# Patient Record
Sex: Female | Born: 1965 | Marital: Married | Smoking: Never smoker
Health system: Northeastern US, Community
[De-identification: ages and names within clinical notes are randomized; demographics above are authoritative.]

## PROBLEM LIST (undated history)

## (undated) DIAGNOSIS — G4730 Sleep apnea, unspecified: Secondary | ICD-10-CM

## (undated) DIAGNOSIS — N83299 Other ovarian cyst, unspecified side: Secondary | ICD-10-CM

## (undated) DIAGNOSIS — N83209 Unspecified ovarian cyst, unspecified side: Secondary | ICD-10-CM

## (undated) DIAGNOSIS — G4733 Obstructive sleep apnea (adult) (pediatric): Secondary | ICD-10-CM

## (undated) DIAGNOSIS — Z973 Presence of spectacles and contact lenses: Secondary | ICD-10-CM

## (undated) DIAGNOSIS — K219 Gastro-esophageal reflux disease without esophagitis: Secondary | ICD-10-CM

## (undated) HISTORY — DX: Unspecified ovarian cyst, unspecified side: N83.209

## (undated) HISTORY — DX: Presence of spectacles and contact lenses: Z97.3

## (undated) HISTORY — DX: Obstructive sleep apnea (adult) (pediatric): G47.33

## (undated) HISTORY — PX: NO SIGNIFICANT SURGICAL HISTORY: 1000005

## (undated) HISTORY — DX: Other ovarian cyst, unspecified side: N83.299

## (undated) HISTORY — DX: Gastro-esophageal reflux disease without esophagitis: K21.9

## (undated) HISTORY — DX: Sleep apnea, unspecified: G47.30

---

## 1999-04-26 ENCOUNTER — Ambulatory Visit (HOSPITAL_BASED_OUTPATIENT_CLINIC_OR_DEPARTMENT_OTHER): Payer: Self-pay

## 1999-04-26 LAB — CHG CYTP SLIDES CERV/VAG MNL SCRN PHYSICIAN SUPV

## 1999-05-08 ENCOUNTER — Ambulatory Visit (HOSPITAL_BASED_OUTPATIENT_CLINIC_OR_DEPARTMENT_OTHER): Payer: Self-pay

## 1999-10-01 ENCOUNTER — Ambulatory Visit (HOSPITAL_BASED_OUTPATIENT_CLINIC_OR_DEPARTMENT_OTHER): Payer: Self-pay | Admitting: Internal Medicine

## 1999-10-12 ENCOUNTER — Ambulatory Visit (HOSPITAL_BASED_OUTPATIENT_CLINIC_OR_DEPARTMENT_OTHER): Payer: Self-pay | Admitting: Internal Medicine

## 1999-11-28 ENCOUNTER — Ambulatory Visit (HOSPITAL_BASED_OUTPATIENT_CLINIC_OR_DEPARTMENT_OTHER): Payer: Self-pay | Admitting: Internal Medicine

## 2000-01-15 ENCOUNTER — Emergency Department (HOSPITAL_BASED_OUTPATIENT_CLINIC_OR_DEPARTMENT_OTHER): Payer: Self-pay | Admitting: Emergency Medical Services

## 2000-07-18 ENCOUNTER — Ambulatory Visit (HOSPITAL_BASED_OUTPATIENT_CLINIC_OR_DEPARTMENT_OTHER): Payer: Self-pay | Admitting: Internal Medicine

## 2000-07-18 LAB — URINE CULTURE/COLONY COUNT

## 2000-11-04 ENCOUNTER — Ambulatory Visit (HOSPITAL_BASED_OUTPATIENT_CLINIC_OR_DEPARTMENT_OTHER): Payer: Self-pay | Admitting: Internal Medicine

## 2000-11-11 ENCOUNTER — Ambulatory Visit (HOSPITAL_BASED_OUTPATIENT_CLINIC_OR_DEPARTMENT_OTHER): Payer: Self-pay | Admitting: Internal Medicine

## 2000-11-11 LAB — CHG CYTP SLIDES CERV/VAG MNL SCRN PHYSICIAN SUPV

## 2001-01-08 ENCOUNTER — Ambulatory Visit (HOSPITAL_BASED_OUTPATIENT_CLINIC_OR_DEPARTMENT_OTHER): Payer: Self-pay | Admitting: Internal Medicine

## 2001-01-27 ENCOUNTER — Ambulatory Visit (HOSPITAL_BASED_OUTPATIENT_CLINIC_OR_DEPARTMENT_OTHER): Payer: Self-pay

## 2001-02-04 ENCOUNTER — Ambulatory Visit (HOSPITAL_BASED_OUTPATIENT_CLINIC_OR_DEPARTMENT_OTHER): Payer: Self-pay | Admitting: Internal Medicine

## 2001-02-05 LAB — XR CHEST 2 VIEWS

## 2001-09-29 ENCOUNTER — Ambulatory Visit (HOSPITAL_BASED_OUTPATIENT_CLINIC_OR_DEPARTMENT_OTHER): Payer: Self-pay | Admitting: Internal Medicine

## 2001-09-29 LAB — IADNA CHLAMYDIA TRACHOMATIS DIRECT PROBE TQ: GENPROBE CHLAMYDIA: NEGATIVE

## 2001-09-29 LAB — ~~LOC~~-PROGRESS/CLIN NOTE

## 2001-09-29 LAB — CYTOPATH, C/V, THIN LAYER

## 2001-09-29 LAB — IADNA NEISSERIA GONORRHOEAE DIRECT PROBE TQ: GENPROBE GC: NEGATIVE

## 2001-10-06 ENCOUNTER — Ambulatory Visit (HOSPITAL_BASED_OUTPATIENT_CLINIC_OR_DEPARTMENT_OTHER): Payer: Self-pay | Admitting: Internal Medicine

## 2001-10-06 LAB — ~~LOC~~-PROGRESS/CLIN NOTE

## 2002-04-03 ENCOUNTER — Emergency Department (HOSPITAL_BASED_OUTPATIENT_CLINIC_OR_DEPARTMENT_OTHER): Payer: Self-pay

## 2002-04-03 LAB — IADNA NEISSERIA GONORRHOEAE DIRECT PROBE TQ: GENPROBE GC: NEGATIVE

## 2002-04-03 LAB — URINE CULTURE/COLONY COUNT

## 2002-04-03 LAB — IADNA CHLAMYDIA TRACHOMATIS DIRECT PROBE TQ: GENPROBE CHLAMYDIA: NEGATIVE

## 2002-04-08 LAB — XR CHEST 2 VIEWS

## 2002-04-08 LAB — XR ABDOMEN (KUB) WITH UPRIGHT AND OR DECUBITUS

## 2003-02-04 LAB — URINALYSIS
BACTERIA: >50 PER HPF — ABNORMAL HIGH (ref 0–?)
BILIRUBIN, URINE: NEGATIVE
CASTS: NONE SEEN PER LPF
CRYSTALS: NONE SEEN
GLUCOSE, URINE: NEGATIVE MG/DL
KETONE, URINE: NEGATIVE MG/DL
LEUKOCYTE ESTERASE: NEGATIVE
NITRITE, URINE: POSITIVE — ABNORMAL HIGH
OCCULT BLOOD, URINE: NEGATIVE
PH URINE: 7.5 (ref 5.0–8.0)
RED BLOOD CELLS URINE: NONE SEEN PER HPF (ref 0–2)
SPECIFIC GRAVITY URINE: 1.02 (ref 1.003–1.035)
SQUAMOUS EPITHELIAL CELLS: >10 PER LPF — ABNORMAL HIGH (ref 0–2)

## 2003-02-04 LAB — BASIC METABOLIC PANEL
BUN (UREA NITROGEN): 10 mg/dl (ref 10–20)
CALCIUM: 8.5 mg/dl (ref 8.5–10.5)
CARBON DIOXIDE: 26 mEQ/L (ref 22–32)
CHLORIDE: 96 mEQ/L — ABNORMAL LOW (ref 98–110)
CREATININE: 0.7 mg/dl — ABNORMAL LOW (ref 0.8–1.2)
Glucose Random: 89 mg/dl (ref 65–160)
POTASSIUM: 3.6 mEQ/L (ref 3.5–5.0)
SODIUM: 138 mEQ/L (ref 135–145)

## 2003-02-04 LAB — LIPASE: LIPASE: 23 U/L (ref 8–57)

## 2003-02-04 LAB — CHG HEPATIC FUNCTION PANEL
ALANINE AMINOTRANSFERASE: 30 IU/L (ref 3–36)
ALBUMIN: 3.7 g/dl (ref 3.5–5.0)
ALKALINE PHOSPHATASE: 82 IU/L (ref 50–136)
ASPARTATE AMINOTRANSFERASE: 32 U/L — ABNORMAL HIGH (ref 7–29)
BILIRUBIN DIRECT: 0.2 mg/dl (ref 0–0.36)
BILIRUBIN TOTAL: 0.6 mg/dl (ref 0.15–1.0)
TOTAL PROTEIN: 6.5 g/dl (ref 6.2–8.5)

## 2003-02-04 LAB — BLOOD COUNT COMPLETE AUTO&AUTO DIFRNTL WBC
BASOPHIL %: 1.2 % (ref 0–2)
EOSINOPHIL %: 12.9 % (ref 0–7)
HEMATOCRIT: 39.4 % (ref 37.0–47.0)
HEMOGLOBIN: 13.6 g/dL (ref 12.0–16.0)
LYMPHOCYTE %: 31.6 % (ref 12–39)
MEAN CORP HGB CONC: 34.4 g/dL (ref 32.0–36.0)
MEAN CORPUSCULAR HGB: 32.3 pg — ABNORMAL HIGH (ref 27.0–31.0)
MEAN CORPUSCULAR VOL: 93.8 fL (ref 81.0–99.0)
MEAN PLATELET VOLUME: 8.2 fL (ref 6.4–10.8)
MONOCYTE %: 5.8 % (ref 1–12)
NEUTROPHIL %: 48.5 % (ref 46–79)
PLATELET COUNT: 233 10*3/uL (ref 150–400)
RBC DISTRIBUTION WIDTH: 12.3 % (ref 11.5–14.3)
RED BLOOD CELL COUNT: 4.2 MIL/uL (ref 4.20–5.40)
WHITE BLOOD CELL COUNT: 4.9 10*3/uL (ref 4.8–10.8)

## 2003-02-04 LAB — AMYLASE: AMYLASE: 55 U/L (ref 25–130)

## 2003-02-05 LAB — CHG LIPID PANEL
Cholesterol: 166 mg/dl (ref ?–200)
HIGH DENSITY LIPOPROTEIN: 28 mg/dl — ABNORMAL LOW (ref 35–95)
LOW DENSITY LIPOPROTEIN DIRECT: 104 mg/dl (ref ?–130)
RISK FACTOR: 5.9 — ABNORMAL HIGH (ref ?–4.4)
TRIGLYCERIDES: 276 mg/dl — ABNORMAL HIGH (ref 30–200)

## 2003-02-05 LAB — BLOOD COUNT COMPLETE AUTO&AUTO DIFRNTL WBC
BASOPHIL %: 0.9 % (ref 0–2)
EOSINOPHIL %: 8.6 % — ABNORMAL HIGH (ref 0–7)
HEMATOCRIT: 35.5 % — ABNORMAL LOW (ref 37.0–47.0)
HEMOGLOBIN: 12.4 g/dL (ref 12.0–16.0)
LYMPHOCYTE %: 31.4 % (ref 12–39)
MEAN CORP HGB CONC: 34.9 g/dL (ref 32.0–36.0)
MEAN CORPUSCULAR HGB: 32.6 pg — ABNORMAL HIGH (ref 27.0–31.0)
MEAN CORPUSCULAR VOL: 93.2 fL (ref 81.0–99.0)
MEAN PLATELET VOLUME: 8.1 fL (ref 6.4–10.8)
MONOCYTE %: 7.3 % (ref 1–12)
NEUTROPHIL %: 51.8 % (ref 46–79)
PLATELET COUNT: 245 10*3/uL (ref 150–400)
RBC DISTRIBUTION WIDTH: 12.1 % (ref 11.5–14.3)
RED BLOOD CELL COUNT: 3.8 MIL/uL — ABNORMAL LOW (ref 4.20–5.40)
WHITE BLOOD CELL COUNT: 8.1 10*3/uL (ref 4.8–10.8)

## 2003-02-05 LAB — GLUCOSE RANDOM: Glucose Random: 92 mg/dl (ref 65–160)

## 2003-02-05 LAB — THYROID SCREEN TSH REFLEX FT4: THYROID SCREEN TSH REFLEX FT4: 1.6 u[IU]/mL (ref 0.34–5.60)

## 2003-02-05 LAB — HCG QUALITATIVE SERUM: HCG QUALITATIVE SERUM: NEGATIVE

## 2003-02-05 LAB — IRON: IRON: 48 ug/dl (ref 42–135)

## 2006-07-24 ENCOUNTER — Encounter (HOSPITAL_BASED_OUTPATIENT_CLINIC_OR_DEPARTMENT_OTHER): Payer: Self-pay

## 2006-07-24 DIAGNOSIS — G4733 Obstructive sleep apnea (adult) (pediatric): Secondary | ICD-10-CM

## 2006-07-24 DIAGNOSIS — N83209 Unspecified ovarian cyst, unspecified side: Secondary | ICD-10-CM | POA: Insufficient documentation

## 2006-07-24 DIAGNOSIS — K219 Gastro-esophageal reflux disease without esophagitis: Secondary | ICD-10-CM | POA: Insufficient documentation

## 2006-07-24 HISTORY — DX: Obstructive sleep apnea (adult) (pediatric): G47.33

## 2006-07-24 HISTORY — DX: Unspecified ovarian cyst, unspecified side: N83.209

## 2006-10-27 ENCOUNTER — Ambulatory Visit (HOSPITAL_BASED_OUTPATIENT_CLINIC_OR_DEPARTMENT_OTHER): Payer: Self-pay | Admitting: Family Medicine

## 2006-10-27 ENCOUNTER — Ambulatory Visit (HOSPITAL_BASED_OUTPATIENT_CLINIC_OR_DEPARTMENT_OTHER): Payer: Commercial Managed Care - HMO | Admitting: Family Medicine

## 2006-10-27 VITALS — BP 102/68 | HR 90 | Temp 98.3°F | Resp 20 | Ht 64.0 in | Wt 203.6 lb

## 2006-10-27 DIAGNOSIS — J209 Acute bronchitis, unspecified: Secondary | ICD-10-CM

## 2006-10-27 MED ORDER — AMOXICILLIN 500 MG PO CAPS
ORAL_CAPSULE | ORAL | Status: AC
Start: 2006-10-27 — End: 2006-11-05

## 2006-10-27 MED ORDER — ALBUTEROL 90 MCG/ACT IN AERS
INHALATION_SPRAY | RESPIRATORY_TRACT | Status: AC
Start: 2006-10-27 — End: 2006-11-27

## 2006-10-27 NOTE — Patient Instructions (Signed)
Continuar con   Tylenol Cold & Sinus si ayuda con sus symptomas    Tambien puede [place] Afrin Nasal spray por congestion    Y     Ibuprofen (Advil/Motrin o generico) 600mg  cada 6-8 horas.     Regressa a clinica si no siente mejor in 4-5 dias.

## 2006-10-27 NOTE — Progress Notes (Signed)
SUBJECTIVE:   Bonnie Tucker is a 40 year old female who complains of sinus and nasal congestion, sore throat, nasal blockage, post nasal drip, myalgias, headache, fever and chills for 14 days. She denies a history of chills and fevers and denies a history of asthma. Patient denies smoke cigarettes. Tylenol cold & Sinus, Robitussin DM & PM, neither helped. Cough produtive of green phlegm.     Family H/o asthma in sister.  Soc: non-smoker     OBJECTIVE:  She appears well, vital signs are as noted by the nurse. Ears normal. Throat and pharynx normal. Neck supple. No adenopathy in the neck. Nose is congested. Sinuses non tender. The chest is clear, without wheezes or rales.CVS exam: S1, S2 normal, no murmur, click, rub or gallop, regular rate and rhythm.      ASSESSMENT:   Acute bronchitis    PLAN:    466.0 ACUTE BRONCHITIS (primary encounter diagnosis)  Plan: AMOXICILLIN 500 MG OR CAPS, ALBUTEROL 90    MCG/ACT IN AERS  Symptomatic therapy suggested: push fluids, rest and ROV prn if symptoms persist or worsen. Call or return to clinic prn if these symptoms worsen or fail to improve as anticipated. Continue OTC meds for symptom relief as outlined in Pt. Instructions.

## 2006-11-26 ENCOUNTER — Ambulatory Visit (HOSPITAL_BASED_OUTPATIENT_CLINIC_OR_DEPARTMENT_OTHER): Payer: Self-pay

## 2007-01-05 ENCOUNTER — Encounter (HOSPITAL_BASED_OUTPATIENT_CLINIC_OR_DEPARTMENT_OTHER): Payer: Self-pay

## 2007-01-05 ENCOUNTER — Ambulatory Visit (HOSPITAL_BASED_OUTPATIENT_CLINIC_OR_DEPARTMENT_OTHER): Payer: Commercial Managed Care - PPO

## 2007-01-05 VITALS — BP 122/82 | HR 86 | Temp 98.8°F | Ht 62.0 in | Wt 203.0 lb

## 2007-01-05 DIAGNOSIS — Z0000 Encounter for general adult medical examination without abnormal findings: Secondary | ICD-10-CM

## 2007-01-05 DIAGNOSIS — N920 Excessive and frequent menstruation with regular cycle: Secondary | ICD-10-CM

## 2007-01-05 DIAGNOSIS — E669 Obesity, unspecified: Secondary | ICD-10-CM

## 2007-01-05 DIAGNOSIS — Q828 Other specified congenital malformations of skin: Secondary | ICD-10-CM

## 2007-01-05 DIAGNOSIS — Z23 Encounter for immunization: Secondary | ICD-10-CM

## 2007-01-05 LAB — BLOOD COUNT COMPLETE AUTO&AUTO DIFRNTL WBC
BASOPHIL %: 0.7 % (ref 0.0–2.0)
EOSINOPHIL %: 8.3 % — ABNORMAL HIGH (ref 0.0–7.0)
HEMATOCRIT: 36.3 % (ref 36.0–48.0)
HEMOGLOBIN: 12.3 g/dl (ref 12.0–16.0)
LYMPHOCYTE %: 27.4 % (ref 13.0–39.0)
MEAN CORP HGB CONC: 33.8 g/dl (ref 32.0–36.0)
MEAN CORPUSCULAR HGB: 31.2 pg (ref 27.0–33.0)
MEAN CORPUSCULAR VOL: 92.3 fl (ref 80.0–100.0)
MEAN PLATELET VOLUME: 8.3 fl (ref 6.4–10.8)
MONOCYTE %: 6.3 % (ref 1.0–12.0)
NEUTROPHIL %: 57.3 % (ref 46.0–79.0)
PLATELET COUNT: 236 10*3/uL (ref 150–400)
RBC DISTRIBUTION WIDTH: 13.4 % (ref 11.5–14.3)
RED BLOOD CELL COUNT: 3.94 M/uL — ABNORMAL LOW (ref 4.50–5.10)
WHITE BLOOD CELL COUNT: 6.9 10*3/uL (ref 4.0–10.8)

## 2007-01-05 LAB — THYROID SCREEN TSH REFLEX FT4: THYROID SCREEN TSH REFLEX FT4: 2.58 u[IU]/mL (ref 0.34–5.60)

## 2007-01-05 LAB — GLUCOSE FASTING: GLUCOSE FASTING: 85 mg/dl (ref 74–118)

## 2007-01-05 LAB — CHG LIPID PANEL
Cholesterol: 186 mg/dl (ref 0–200)
HIGH DENSITY LIPOPROTEIN: 30 mg/dl — ABNORMAL LOW (ref 35–85)
LOW DENSITY LIPOPROTEIN DIRECT: 126 mg/dl — ABNORMAL HIGH (ref 0–100)
RISK FACTOR: 6.2 — ABNORMAL HIGH (ref ?–4.4)
TRIGLYCERIDES: 204 mg/dl — ABNORMAL HIGH (ref 0–150)

## 2007-01-05 NOTE — Patient Instructions (Signed)
Mk appt for pap and skin tag removal w/ [address]

## 2007-01-05 NOTE — Progress Notes (Signed)
Influenza Vaccine Procedure  January 05, 2007    1. Has the patient received the information for the influenza vaccine? Yes    2. Does the patient have any of the following contraindications?  Allergy to eggs? No  Allergic reaction to previous influenza vaccines? No  Any other problems to previous influenza vaccines? No  Paralyzed by Guillain-Barre syndrome? No  Currently pregnant? No  Current moderate or severe illness? No  Allergy to contact lens solution? No    3. The vaccine has been administered in the usual fashion and the patient/guardian was instructed to wait 20 minutes before leaving the building in the event of an allergic reaction:     Immunization information and VIS for flu vaccine(s) for 2007-2008 reviewed; verbal consent given by patient/guardian.    @TD @  VIS given prior to administration and reviewed with the patient and or legal guardian. Patient understands the disease and the vaccine. See immunization/Injection module or chart review for date of publication and additional information.  @ME @

## 2007-01-05 NOTE — Progress Notes (Signed)
41 year old female for annual routine checkup.    PMH/PSH:  Patient Active Problem List:   MIGRAINE NOS [346.9]   OVARIAN CYST NEC/NOS [620.2]   OBSTRUCTIVE SLEEP APNEA, ADULT & PED [327.23]    OB/gyn hx:  [id number]  LMP 01/03/07  No h/o abnl pap.  No h/o STIs.      No current outpatient prescriptions on file prior to 01/05/07.      Review of Patient's Allergies indicates:   Nkda (no known drug*       Review of patient's family history indicates:   Hypertension Mother    Asthma Sister    OTHER Father    Comment: killed by terrorists [place]   In Good Health Brother    In Good Health Brother       Social History   Marital Status: Married Number of children: 3     Social History Main Topics   Tobacco Use: Never    Alcohol Use: No    Drug Use: No    Sexual Activity: Yes Partners with: Female   Birth Control/Protection: IUD   Comment: paraguard 2000    Social History Narrative   Live w/ husband and three children. [place] in [employer]     ROS:   Gen: No appetite change. 30 lbs in 3 yrs. No night sweats.   HEENT: No HAs. No changes in hearing or vision changes. No oral lesions.   Pulm/CV: No sob, cough, hemoptysis. No CP. No palpitations. No dyspnea or chest pain on exertion.   GI: No abdominal pain, change in bowel habits, black or bloody stools.   Uro:No urinary tract symptoms.   Derm: Pt w/ neck skin tags.   Psych: Mood stable.  GYN: menses q 28 d, bleed for 5d, flow=heavy, (+) dysmenorrhea relieved w/ motrin. (-) vaginal d/c.  GU: No sexual concerns  MS: No joint pain or m pain    Diet/Nutrition: servings fruit/veg:2 soda:0 fiber:11  Physical activity: no. Will start this wk.      RHM:  Last pap 1 yr ago  Ca+2/Vit D dw pt today  Folic acid dw today   Td due  Flu due  Chicken pox as child  Last dentist 1 mos ago  Cholesterol ?  BS ?    OBJECTIVE: BP 122/82   Pulse 86   Temp (Src) 98.8 (Oral)   Ht 5\' 2"  (1.44m)   Wt 203 lbs (92.1kg)   SaO2 96%   LMP 01/03/2007  Gen: WD/WN, in NAD.   Skin: Warm/dry, small ~  < or = 1mm skin tags anterior and R lateral neck diffusely, ~5.  HEENT: NC/AT; PERRL, EOMI; TMs wnl;OP MMM, no erythema or lesions.  Neck: supple. No adenopathy or thyromegaly.   Cor: RRR, no r/g/m  Lungs: CTA b/l, no wheezes, rhonchi or rales.   Abdomen:+BS, soft, NT/ND, no HSM  Breasts: Normal without suspicious masses, skin or nipple changes or discharge, symmetric fibrous changes throughout b/l, self-exam is taught.   GU: deferred pt w/ menses  Ext: No clubbing, cyanosis or edema. DP 2 + b/l.  Neuro: CN II-XII intact b/l, A+Ox3, MS 5/5, DTRs 2+ upper and lower ext b/l, gross coordination and sensation intact throughout.  Psych: Affect, thought process, speech and memory wnl  MS: No jt pain, scoliosis, kyphosis or CVAT.    ASSESSMENT: 41 year old female here for CPEX w/ obesity.     PLAN:   626.2 EXCESSIVE MENSTRUATION  Plan: THYROID SCREEN TSH, COMPLETE CBC W/AUTO DIFF    WBC, ROUTINE VENIPUNCTURE       V70.0 ROUTINE MEDICAL EXAM  Plan: LIPID PANEL, GLUCOSE FASTING, ROUTINE    VENIPUNCTURE. BE today wnl. Order for screening mammo. Dw pt Ca and vit d and folic acid supplementation. Pt to rtc for pap as w/ menses today. Flu and tetanus given today.        V06.5 VACCINE FOR TETANUS/DIPHTERIA  Plan: IMMUNIZATION ADMIN EACH ADD, RN, TD VACCINE >    7, IM       V04.81 VACCINE FOR INFLUENZA  Plan: IMMUNIZATION ADMIN SINGLE, RN, FLU VACCINE, 3    YRS & >, IM       278.00 OBESITY NOS  Plan: REFERRAL TO NUTRITION (INT). Dw pt healthy eating and inc'ing fruits and veggies. Inc phys act w/ monitoring w/ pedometer.     757.39 SKIN ANOMALY NEC  Note: skin tags   Plan: F/u next visit for removal.    RTC next avail for pap and skin tag removal and in 3 mos for obesity and in 1 yr for cpex and pap or prn acute issues    D/w Dr. Eliberto Ivory Thurl Boen,MD

## 2007-01-12 ENCOUNTER — Other Ambulatory Visit: Payer: Self-pay

## 2007-01-13 LAB — MA SCREENING MAMMO BILATERAL WITH CAD

## 2007-01-20 ENCOUNTER — Ambulatory Visit (HOSPITAL_BASED_OUTPATIENT_CLINIC_OR_DEPARTMENT_OTHER): Payer: Commercial Managed Care - PPO

## 2007-01-20 VITALS — BP 110/76 | HR 101 | Temp 98.1°F | Resp 18 | Wt 208.0 lb

## 2007-01-20 DIAGNOSIS — Z124 Encounter for screening for malignant neoplasm of cervix: Secondary | ICD-10-CM

## 2007-01-20 DIAGNOSIS — E785 Hyperlipidemia, unspecified: Secondary | ICD-10-CM

## 2007-01-20 NOTE — Progress Notes (Signed)
The patient was discussed with Dr. Mann. Agree with assessment and plan.

## 2007-01-20 NOTE — Progress Notes (Signed)
SUBJECTIVE: 41 year old female w/ Patient Active Problem List:   MIGRAINE NOS [346.9]   OVARIAN CYST NEC/NOS [620.2]   OBSTRUCTIVE SLEEP APNEA, ADULT & PED [327.23]   who presents for pap and pelvic. Seen for cpex 01/05/07 and pap deferred 2/2 menses.  OB/gyn hx:  [id number]  LMP 01/03/07  No h/o abnl pap.  No h/o STIs.  GYN: menses q 28 d, bleed for 5d, flow=heavy, (+) dysmenorrhea relieved w/ motrin. (-) vaginal d/c.  GU: No sexual concerns  Sexual Activity: Yes Partners with: Female   Birth Control/Protection: IUD   Comment: paraguard 2000  Review cholesterol labs w/ pt.     OBJECTIVE: BP 110/76   Pulse 101   Temp (Src) 98.1 (Oral)   Resp 18   Wt 208 lbs (94.3kg)  Gen: NAD  GU: Exam chaperoned by [place], normal cervix without lesions, polyps or tenderness, uterus normal size, shape, consistency, no mass or CMT, adnexa normal in size without mass or tenderness. Pap and hpv dna done.      ASSESSMENT: 41 year old female w/ Patient Active Problem List:   MIGRAINE NOS [346.9]   OVARIAN CYST NEC/NOS [620.2]   OBSTRUCTIVE SLEEP APNEA, ADULT & PED [327.23]here for pap and pelvic exam.     PLAN:  V76.2 SCREENING MAL NEOP-CERVIX (primary encounter diagnosis)  Plan: PAP SMEAR THIN PREP, HUMAN PAPILLOMAVIRUS    272.4 HYPERLIPIDEMIA NEC/NOS  Note: tg 204  Plan: dw pt dec sugar and carbs. Advised to eat whole grains when eating carbs. Omega 3 fatty acid. Inc physical activity.          Rtf 01/28/07 w/ dr lesser for skin tag removal.    D/w Dr. Valera Castle, MD

## 2007-01-26 ENCOUNTER — Encounter (HOSPITAL_BASED_OUTPATIENT_CLINIC_OR_DEPARTMENT_OTHER): Payer: Commercial Managed Care - PPO | Admitting: Registered"

## 2007-01-28 ENCOUNTER — Ambulatory Visit (HOSPITAL_BASED_OUTPATIENT_CLINIC_OR_DEPARTMENT_OTHER): Payer: Commercial Managed Care - PPO

## 2007-01-28 VITALS — BP 100/70 | HR 90 | Temp 98.6°F | Wt 206.0 lb

## 2007-01-28 DIAGNOSIS — L909 Atrophic disorder of skin, unspecified: Secondary | ICD-10-CM

## 2007-01-28 DIAGNOSIS — L919 Hypertrophic disorder of the skin, unspecified: Principal | ICD-10-CM

## 2007-01-28 LAB — CYTOPATH, C/V, THIN LAYER

## 2007-01-28 NOTE — Progress Notes (Addendum)
Quick Note:    Will await hpv dna results.  ______

## 2007-01-28 NOTE — Progress Notes (Signed)
PATIENT/PROCEDURE VERIFICATION DOCUMENTATION    Correct patient: Yes  Correct procedure: Yes  Correct side, site, mark visible if applicable: N/A  Correct position: Yes  Special equipment/implant(s) present, if applicable: Yes    Time-out completed, documented by provider doing procedure or designated team member:  Myna Bright, MD 01/28/2007 11:27 AM    SUBJECTIVE:   Bonnie Tucker is a 41 year old female who presents for skin tag removal. We have discussed this procedure, including option of not performing surgery, technique of surgery and potential for scarring at a recent visit.    OBJECTIVE:   Patient appears well. Vitals are normal.  Skin: 10 skin tags on neck, upper chest, and one on upper thigh    ASSESSMENT:   skin tags, without lesions    PLAN:   After informed consent was obtained, using Betadine or alcohol for cleansing and 2% xylocaine with epinephrine for anesthetic (only 2 lesions anesthetized), with sterile technique, skin tag excision with tissue forceps was performed. Dressing is applied, and wound care instructions provided. Be alert for any signs of cutaneous infection. The procedure was well tolerated without complications. Follow up: the patient may return prn.

## 2007-01-29 LAB — HUMAN PAPILLOMAVIRUS (HPV): HUMAN PAPILLOMAVIRUS: NEGATIVE

## 2007-01-29 NOTE — Progress Notes (Addendum)
I d/w resident physician, first assited at procedure, and agree c assessment and plan as documented in the resident physician's note.

## 2007-02-02 NOTE — Progress Notes (Addendum)
This patient's case was reviewed with the resident by me in its entirety. I have reviewed the key points of the history and physical as written by the resident. I agree with the assessment and plan as stated in the resident note. The patient was not seen by me personally.

## 2007-02-23 ENCOUNTER — Ambulatory Visit (HOSPITAL_BASED_OUTPATIENT_CLINIC_OR_DEPARTMENT_OTHER): Payer: Commercial Managed Care - PPO | Admitting: Registered"

## 2007-02-23 DIAGNOSIS — E669 Obesity, unspecified: Secondary | ICD-10-CM

## 2007-02-26 ENCOUNTER — Encounter (HOSPITAL_BASED_OUTPATIENT_CLINIC_OR_DEPARTMENT_OTHER): Payer: Commercial Managed Care - HMO | Admitting: Registered"

## 2007-02-27 DIAGNOSIS — E669 Obesity, unspecified: Secondary | ICD-10-CM | POA: Insufficient documentation

## 2007-02-27 NOTE — Progress Notes (Signed)
NUTRITION INITIAL VISIT    Brief 15 min visit as pt was 45 mins late to appt. Reviewed intake, encouraged eating more often throughout the day (often eating only once or twice). Also encouraged more activity as able. Reviewed plate picture as well. Agreeable to suggestions. Follow up in 3 months.     Lajean Manes, MS, RD, LDN

## 2007-07-10 ENCOUNTER — Ambulatory Visit (HOSPITAL_BASED_OUTPATIENT_CLINIC_OR_DEPARTMENT_OTHER): Payer: Commercial Managed Care - HMO

## 2007-07-10 VITALS — BP 106/70 | HR 81 | Temp 98.7°F | Resp 20 | Wt 208.2 lb

## 2007-07-10 DIAGNOSIS — Z7184 Encounter for health counseling related to travel: Secondary | ICD-10-CM

## 2007-07-10 MED ORDER — CIPROFLOXACIN HCL 500 MG PO TABS
ORAL_TABLET | ORAL | Status: DC
Start: 2007-07-10 — End: 2008-01-15

## 2007-07-10 NOTE — Progress Notes (Signed)
PRECEPTOR NOTE  I personally discussed and reviewed the history, physical, and plan with the resident.  Please see resident's note for further details.

## 2007-07-11 NOTE — Progress Notes (Signed)
This is a 40 year old female with no significant medical issues who presents for counseling on immunizations prior to leaving for a two week trip to [place] in three days. Plans on staying in [place] with her husband and daughter, with a few day stay outside the city[place]    Otherwise, doing well. No recent illness.     No current outpatient prescriptions on file prior to encounter.    Review of Patient's Allergies indicates:  No Known Allergies.    OBJECTIVE:  BP 106/70   Pulse 81   Temp (Src) 98.7 F (37.1 C) (Oral)   Resp 20   Wt 208 lb 3.2 oz (94.439 kg)   SpO2 97%   LMP 07/07/2007  Gen: NAD, alert and oriented, pleasant.  Affect: normal.    ASSESSMENT:  41 year old female in apparent good health here for travel counseling.    PLAN:  Looked on [employer]. It appears that she will not be in a high risk zone for typhoid or malaria. No point in giving Hep A vaccine as she is leaving in three days, coming back in two weeks. Counseled on water and hygiene safety, malaria prophylaxia (long clothes, bug deterants). Gave script for three days of Cipro if has severe diarrhea with fever. If not improving in three days needs to be seen by physician there. RTO when returns for CPEX.    This case was discussed with Dr. Gaspar Skeeters.

## 2007-08-25 ENCOUNTER — Ambulatory Visit (HOSPITAL_BASED_OUTPATIENT_CLINIC_OR_DEPARTMENT_OTHER): Payer: Commercial Managed Care - HMO

## 2007-09-24 ENCOUNTER — Ambulatory Visit (HOSPITAL_BASED_OUTPATIENT_CLINIC_OR_DEPARTMENT_OTHER): Payer: Commercial Managed Care - HMO

## 2007-10-08 ENCOUNTER — Ambulatory Visit (HOSPITAL_BASED_OUTPATIENT_CLINIC_OR_DEPARTMENT_OTHER): Payer: Commercial Managed Care - HMO

## 2008-01-15 ENCOUNTER — Ambulatory Visit (HOSPITAL_BASED_OUTPATIENT_CLINIC_OR_DEPARTMENT_OTHER): Payer: Commercial Managed Care - HMO

## 2008-01-15 ENCOUNTER — Encounter (HOSPITAL_BASED_OUTPATIENT_CLINIC_OR_DEPARTMENT_OTHER): Payer: Self-pay

## 2008-01-15 VITALS — BP 120/70 | HR 84 | Temp 98.3°F | Resp 20 | Ht 62.0 in | Wt 209.1 lb

## 2008-01-15 DIAGNOSIS — S43429A Sprain of unspecified rotator cuff capsule, initial encounter: Secondary | ICD-10-CM

## 2008-01-15 DIAGNOSIS — Z0000 Encounter for general adult medical examination without abnormal findings: Secondary | ICD-10-CM

## 2008-01-15 DIAGNOSIS — E876 Hypokalemia: Secondary | ICD-10-CM

## 2008-01-15 LAB — BASIC METABOLIC PANEL
ANION GAP: 4 mmol/L (ref 2–25)
BUN (UREA NITROGEN): 11 mg/dl (ref 6–20)
CALCIUM: 8.9 mg/dl (ref 8.6–10.0)
CARBON DIOXIDE: 27 mmol/L (ref 22–32)
CHLORIDE: 107 mmol/L (ref 101–111)
CREATININE: 0.9 mg/dl (ref 0.4–1.2)
ESTIMATED GLOMERULAR FILT RATE: >60 mL/min (ref 60–116)
Glucose Random: 108 mg/dl (ref 74–160)
POTASSIUM: 3.7 mmol/L (ref 3.5–5.1)
SODIUM: 138 mmol/L (ref 135–144)

## 2008-01-15 LAB — SCREENING TEST VISUAL ACUITY QUANTITATIVE BILAT

## 2008-01-15 MED ORDER — MOTRIN 600 MG PO TABS
ORAL_TABLET | ORAL | Status: AC
Start: 2008-01-15 — End: 2008-03-14

## 2008-01-15 NOTE — Progress Notes (Signed)
VIS given prior to administration and reviewed with the patient and or legal guardian. Patient understands the disease and the vaccine. See immunization/Injection module or chart review for date of publication and additional information.

## 2008-01-15 NOTE — Progress Notes (Signed)
PRECEPTOR NOTE   I personally reviewed patient's history and exam findings with resident Dr. Butts.  I confirm the key elements of history and physical exam as described in resident's note.  I agree with the assessment and plan as described by resident.  Please see resident's note for further details.

## 2008-01-15 NOTE — Progress Notes (Signed)
SUBJECTIVE:   42 year old female for annual routine pap and checkup.  Last CPEX few years ago.  [place] Vernie Mur[place] [address]  Never had abnormal pap.  Lipids checked in 11/04/05      Unsure of last tetanus.  Would like to get today.    Uses IUD for birth control. Has had for ~10 years.  Would like new IUD.  Has had no issues with IUD, no concerns or complaints.  Still mensturating regularly. LMP 3 weeks ago.     Pt reports hx of hypokalemia on lab in past, never used diuretics or herbal wt loss supplements. Would like k+checked today.    Pt has one concern today. Reports R arm pain which starts in shoulder and radiates down entire arm. Sx 's x 1 week, started when pt slipped on ice, flailed arms while trying to regain balance, did not fall.  Has had pain since.  Pain has gotten progessively better.  Has triend icy hot with some relief.  Originally had neck stiffness as well, which has resolved.  Has been taking 400 mg motrin bid-tid for most days since injury, which has also helped.  Associated with weakness. No numbness or tingling.  Pain is described as 6/10, which is improved, hurts worse with movement, lifting.  Feels better with rest.  Does not wake pt from sleep.      Current outpatient Rx: none    Allergies: Review of patient's allergies indicates no known allergies.   Patient's last menstrual period was 12/25/2007.    ROS:  Feeling well. No dyspnea or chest pain on exertion.  No abdominal pain, change in bowel habits, black or bloody stools.  No urinary tract symptoms. GYN ROS: normal menses, no abnormal bleeding, pelvic pain or discharge and no breast pain or new or enlarging lumps on self exam.    OBJECTIVE:   The patient appears well, in NAD.   BP 120/70   Pulse 84   Temp (Src) 98.3 F (36.8 C) (Oral)   Resp 20   Ht 5\' 2"  (1.575 m)   Wt 209 lb 2 oz (94.858 kg)   SpO2 98%   LMP 12/25/2007  ENT normal.  Neck supple. No adenopathy or thyromegaly. PERLA. Clear, good air entry, no wheezes,  rhonci or rales. S1 and S2 normal, no murmurs, regular rate and rhythm. No edema. Abdomen soft without tenderness, guarding, mass or organomegaly. Ext: no c/c/e, pulses 2+ b/l.  RUE with palpable trapezius spasm, no ttp on R shoulder. +discomfort with ROM. +empty can test. No impingement.  Neuro: Strength 5/5 bl UE and LE, no focal sensory or motor deficits, DTR's 3+ bl UE and LE.    BREAST EXAM: normal without suspicious masses, skin or nipple changes or axillary nodes, examined in upright and supine position, nipples normal without inversion, lesions or discharge, no skin dimpling or peau d'orange    PELVIC EXAM: exam chaperoned by nurse, normal vagina and vulva, EGBUS within normal limits, vaginal discharge described as white, normal cervix without lesions, polyps or tenderness, uterus normal size, shape, consistency, no mass or tenderness, adnexa normal in size without mass or tenderness    ASSESSMENT:   well woman with rotator cuff strain    PLAN:     V70.0B Routine Medical Exam  (primary encounter diagnosis)  Plan: VISUAL ACUITY SCREEN, TDAP VACCINE >7 IM,         CYTOPATH, C/V, THIN LAYER, HUMAN  PAPILLOMAVIRUS, AMPLIFIED GENPROBE CHLAM/GC          counseled on breast self exam, mammography screening - pt had normal mammogram 1 yr ago, discussed data re screeing women 40-49. Pt declined mammogram this year  family planning choices - pt would like current IUD removed and new IUD placed. Pt was counseled regarding the procedure, risks, benefits and different types. Would like Paraguard.    Also counseled regarding adequate intake of calcium and vitamin D      276.8A Hypokalemia  Comment: past hx  Plan: BASIC METABOLIC PANEL        840.4 Rotator Cuff (Capsule) Sprain and Strain  Comment: mild  Plan: MOTRIN 600 MG OR TABS        Return in 4 weeks if no improvement, or sooner prn    Appt for gyn clinic for IUD removal/placement    D/w Roger Shelter

## 2008-01-19 LAB — CHLAMYDIA GC NAAT
GENPROBE CHLAMYDIA: NEGATIVE
GENPROBE GC: NEGATIVE

## 2008-01-20 LAB — HUMAN PAPILLOMAVIRUS (HPV): HUMAN PAPILLOMAVIRUS: NEGATIVE

## 2008-01-26 LAB — CYTOPATH, C/V, THIN LAYER

## 2008-02-01 ENCOUNTER — Telehealth (HOSPITAL_BASED_OUTPATIENT_CLINIC_OR_DEPARTMENT_OTHER): Payer: Self-pay | Admitting: Family Medicine

## 2008-02-01 ENCOUNTER — Ambulatory Visit (HOSPITAL_BASED_OUTPATIENT_CLINIC_OR_DEPARTMENT_OTHER): Payer: Commercial Managed Care - HMO

## 2008-02-01 LAB — URINE PREGNANCY TEST (POINT OF CARE): HCG QUALITATIVE URINE: NEGATIVE

## 2008-02-01 MED ORDER — MOTRIN 600 MG PO TABS
ORAL_TABLET | ORAL | Status: AC
Start: 2008-02-01 — End: 2008-02-01

## 2008-02-01 NOTE — Progress Notes (Signed)
Motrin 600mg  PO given as ordered.  Lot#[id number], exp.12/30/08.  Mfr: [facility]. Pt denies allergies.  Medication teaching done.

## 2008-02-01 NOTE — Telephone Encounter (Signed)
lmtcb

## 2008-02-01 NOTE — Progress Notes (Signed)
PATIENT/PROCEDURE VERIFICATION DOCUMENTATION    Correct patient: Yes  Correct procedure: Yes  Correct side, site, mark visible if applicable: Yes  Correct position: Yes  Special equipment/implant(s) present, if applicable: Yes    Time-out completed, documented by provider doing procedure or designated team member:  Epifanio Lesches    02/01/2008    71:20 PM64 year old, female,  Obstetric History    The patient has not been asked about pregnancy.    No LMP recorded.     She is here for an IUD insertion.    Currently she is   sexually active, monogomous  using contraception: IUD  not concerned about risk for STD      She denies current symptoms of possible pelvic infection, recent pelvic infection, pregnancy, rheumatic or congenital heart disease and allergy to IUD materials.    Completed Labs: negative pregnancy tests    Patient has verbalized understanding of risks and benefits and has signed the consent form.  Current statistical risk of pregnancy with IUD is 0.3-1%.  Current statistical risk of of ectopic pregnancy with IUD is low but if pregnancy occurs ectopic risk is about 35%.    EXAM:  Uterus: anteverted, normal size and non-tender    PROCEDURE:  Paraguard IUD removed easily with traction, completely intact.     Cervix prepped with betadine.  Tenaculum applied to anterior lip of the cervix.    IUD inserted easily.    Strings trimmed.  IUD type: Mirena    Instructions given to patient regarding checking IUD strings, anticipated vaginal bleeding and warning signs.  Instructed to continue to track menstrual cycles and call for any missed periods or abnormal bleeding or pain.    Follow up appointment in 6 weeks.

## 2008-02-01 NOTE — Telephone Encounter (Signed)
Staff Message copied by Earlyne Iba on Mon Feb 01, 2008 5:04 PM  ------   Message from: [place], [place]   Created: Mon Feb 01, 2008 4:29 PM   Regarding: letter    Bonnie Tucker [id number], 42 year old, female, Telephone Information:  Home Phone [phone]  Work Phone [phone]      [name] NUMBER: [phone]  Cell phone:   Other phone:    Available times:    Patient's language of care: English    Patient does not need an interpreter.    Patient's Care Team:     Person calling on behalf of patient: Daughter, carmen    Calls today requesting a letter for: Out of work/school    [facility]  Phone: [phone] Fax: [phone]

## 2008-02-01 NOTE — Telephone Encounter (Signed)
Staff Message copied by Earlyne Iba on Mon Feb 01, 2008 5:51 PM  ------   Message from: Bufford Lope   Created: Mon Feb 01, 2008 5:49 PM   Regarding: call patient    Bonnie Tucker [id number], 42 year old, female, Telephone Information:  Home Phone [phone]  Work Phone [phone]      [name] NUMBER: [phone]  Cell phone:   Other phone:    Available times:    Patient's language of care: English    Patient does not need an interpreter.    Patient's PCP: Enis Gash. Sampson Goon, MD    Person calling on behalf of patient: Patient (self)    Calls today   to speak to provider only.  with questions and concerns.  Wants to speak to a nurse    Patient's [facility]erred [facility]:   [facility]  Phone: [phone] Fax: [phone]

## 2008-03-28 ENCOUNTER — Ambulatory Visit (HOSPITAL_BASED_OUTPATIENT_CLINIC_OR_DEPARTMENT_OTHER): Payer: Commercial Managed Care - HMO

## 2008-03-28 VITALS — BP 116/84 | HR 83 | Temp 98.5°F | Resp 20 | Wt 211.0 lb

## 2008-03-28 DIAGNOSIS — Z30431 Encounter for routine checking of intrauterine contraceptive device: Secondary | ICD-10-CM

## 2008-03-28 NOTE — Progress Notes (Signed)
Bonnie Tucker is a 42 year old female who presents today for 6 week f/u s/p IUD placement.  +some irregular spotting. LMP 03/19/08 and persists today 9 days later, very light spotting of red blood. No cramping.  Otherwise without complaints or concerns today    PEx:  BP 116/84   Pulse 83   Temp (Src) 98.5 F (36.9 C) (Oral)   Resp 20   Wt 211 lb (95.709 kg)   SpO2 99%  General appearance: in no apparent distress, well developed and well nourished, alert, oriented times 3, normal vitals and cooperative.   Abd: soft, NT,  ND, +bs  Pelvic: normal exam, IUD strings visible extending appox 1 cm out of os. +some dark bloody discharge visible.    RTC in 12/2008 for CPEX or sooner prn    D/w Azucena Fallen

## 2008-03-28 NOTE — Progress Notes (Signed)
PRECEPTOR NOTE:  On the day of the patient's visit, I discussed the key elements of history and physical exam and I reviewed the findings with the resident.  I agree with the assessment and plan as described in their documentation.  Please see resident's note for further details.

## 2008-05-31 ENCOUNTER — Ambulatory Visit (HOSPITAL_BASED_OUTPATIENT_CLINIC_OR_DEPARTMENT_OTHER): Payer: Self-pay

## 2008-10-04 ENCOUNTER — Ambulatory Visit (HOSPITAL_BASED_OUTPATIENT_CLINIC_OR_DEPARTMENT_OTHER): Payer: Commercial Managed Care - HMO

## 2008-10-04 VITALS — BP 110/90 | HR 90 | Temp 98.3°F | Resp 22 | Wt 205.2 lb

## 2008-10-04 DIAGNOSIS — R071 Chest pain on breathing: Secondary | ICD-10-CM

## 2008-10-04 MED ORDER — MOTRIN 800 MG PO TABS
ORAL_TABLET | ORAL | Status: AC
Start: 2008-10-04 — End: 2008-10-18

## 2008-10-04 NOTE — Progress Notes (Signed)
Bonnie Tucker is a 42 year old female here with back pain    SUBJECTIVE:  Developed pain in center of chest and back  4 days. Pain was initially 9/10, sharp, and worse with breathing and movement. Tried motrin 800mg  which her husband had which helped.  Pt has not had any recent cough or URI symptoms. She has not had dyspnea but says it hurts to take a deep breath. Pt has had headaches and general body fatigue. Says she stayed in bed almost all day Saturday because she felt so bad. + Subjective fevers but no chills. No palpitations, pain radiating down are, or jaw pain. No one sick at home. No recent travel. No injuries. Says just today she began with a sore throat.     OBJECTIVE:  BP 110/90   Pulse 90   Temp (Src) 98.3 F (36.8 C) (Oral)   Resp 22   Wt 205 lb 4 oz (93.101 kg)   SpO2 98%  Gen: NAD, sitting comfortable  SINUSES: nontender to palpation  MOUTH: no lesions/ulcers, no tonsilar enlargement or pharyngeal exudate  CVR:RRR, no murmurs or rubs  RESP: CTAB, no wrr, no rubs, chest pain reproducible with pressing on chest. No distinct area of tenderness just in a general location in center.     EKG: NSR at 72 bpm. TWF in V1. No ST elevations or depression. Normal intervals. Normal axis.     A/P: [name] is a 42 year old female with reproducible pleuritic type chest pain. No cough or URI sxs. DDX chostonchondritis, pleuritis, pericarditis(less likely as EKG and cardiac exam wnl). May also be the beginning of viral infection or flu.     -rx motrin 800mg  x 2 wks  -f/up in 1wk  -encouraged pt to call if pain persists or develops chest pain with exertion, dyspnea, SOB, fevers, or chills    Maebelle Munroe, MD    D/w Dr. Judie Petit

## 2008-10-04 NOTE — Progress Notes (Signed)
This case was d/w Dr. Martie Lee.  Agree with findings and plan as documented in resident's note.

## 2008-10-05 LAB — EKG

## 2008-10-26 ENCOUNTER — Encounter (HOSPITAL_BASED_OUTPATIENT_CLINIC_OR_DEPARTMENT_OTHER): Payer: Self-pay

## 2008-11-14 ENCOUNTER — Ambulatory Visit: Payer: Self-pay

## 2008-11-15 LAB — MA SCREENING MAMMO BILATERAL WITH CAD

## 2008-11-22 ENCOUNTER — Ambulatory Visit (HOSPITAL_BASED_OUTPATIENT_CLINIC_OR_DEPARTMENT_OTHER): Payer: Commercial Managed Care - HMO

## 2008-11-22 ENCOUNTER — Telehealth (HOSPITAL_BASED_OUTPATIENT_CLINIC_OR_DEPARTMENT_OTHER): Payer: Self-pay | Admitting: Family Medicine

## 2008-11-22 VITALS — BP 126/80 | HR 83 | Temp 99.4°F | Resp 18 | Wt 200.0 lb

## 2008-11-22 DIAGNOSIS — B349 Viral infection, unspecified: Secondary | ICD-10-CM

## 2008-11-22 MED ORDER — TESSALON PERLES 100 MG PO CAPS
ORAL_CAPSULE | ORAL | Status: AC
Start: 2008-11-22 — End: 2008-12-06

## 2008-11-22 NOTE — Telephone Encounter (Signed)
Staff Message copied by Earlyne Iba on Tue Nov 22, 2008 10:23 AM  ------   Message from: [place], [place]   Created: Tue Nov 22, 2008 9:21 AM   Regarding: FLU SYPTOM   Contact: [phone]    Leiyah Maultsby [id number], 42 year old, female, Telephone Information:  Home Phone [phone]  Work Phone [phone]  Mobile [phone]      [name] NUMBER: [phone]  Best time to call back:   Cell phone:   Other phone:    Available times:    Patient's language of care: English    Patient does not need an interpreter.    Patient's PCP: Enis Gash. Sampson Goon, MD    Person calling on behalf of patient: Patient (self)    Calls today with questions and concerns. PL'S CALL HER BACK    Patient's [facility]erred [facility]:   [facility]  Phone: [phone] Fax: [phone]

## 2008-11-22 NOTE — Telephone Encounter (Signed)
[name] calls because of concerns about influenza.    HPI:  She has noted symptoms for 3 days.   Symptoms include fever, chills, fatigue, headache, congestion, sore throat and cough.   Risk factors for swine flu: are not present, including no exposure to a case and no residence in or travel to a community with known cases.   She is not at increased risk of complications of influenza due to age, nursing home residence, aspirin therapy, or a chronic immune, cardiopulmonary, renal, metabolic or hematologic condition  Other symptoms include pain in back and when deep breathing, cough congested and productive    ASSESSMENT:  She has symptoms that are consistent with influenza.  She does have findings to suggest pneumonia. She is not at increased risk of complications of influenza due to age, nursing home residence, aspirin therapy, or a chronic immune, cardiopulmonary, renal, metabolic or hematologic condition.     Therefore, in accordance with the [facility]_Guidelines , I have given advice that includes recommendation to come to the clinic    Plan:  Appointment scheduled

## 2008-11-22 NOTE — Progress Notes (Signed)
Bonnie Tucker is a 42 year old female here with cough and fever    For 3 days has had dry cough, rhinorrhea, sore throat, sneezing. This am had temp to 100.4. Also fatigue and myalgias (leg and back). Yesterday began with nausea. Had to leave work because she felt so sick. Has been taking theraflu which helps. Also taking motrin and honey with lemon.  Played with grandson last week who had cold[place] No recent travel. No known contacts with flu/H1N1.    BP 126/80   Pulse 83   Temp (Src) 99.4 F (37.4 C) (Oral)   Resp 18   Wt 200 lb (90.719 kg)   SpO2 98%  Gen: NAD, sitting comfortable  HEAD: no gross abnormalities  SINUSES: nontender to palpation  EYES: noninjected sclera, no drainage  EARS: TMs clear bilat  NOSE: no rhinorrhea, nasal erythema or turbinate swelling  MOUTH: no lesions/ulcers, no tonsilar enlargement or pharyngeal exudate  CVR:RRR, no mrg  RESP: CTAB, no wrr    Assessment: Bonnie Tucker is a 42 year old female with likely viral URI. Could be flu as has cough, fever, and myalgias. Symptoms for 3 days so tamiflu not likely to be affective at this time.     Plan: Supportive therapy.  -Motrin (Ibuprofen) or Tylenol (Acetaminophen) for fever or pain  -Rx Tessalon pearls for cough  -Fluids and rest  -Can cont OTC cold meds  -Will return if symptoms worsen or fever persists. Will also return if back pain not resolved in more than 2 weeks(pt feels she had pain before she got sick)  -Given note for work. Return when afebrile 24hrs.    Nolawi Kanady S. Kaelan Emami    D/w Dory Larsen

## 2008-11-23 NOTE — Progress Notes (Addendum)
PRECEPTOR NOTE  On the day of the patient's visit, I reviewed the key elements of history and physical exam as described in Dr. Etheridge's note.  I agree with the assessment and plan as described below.  Please see resident's note for further details.  Juliany Daughety N. Yukari Flax, MD

## 2008-11-28 ENCOUNTER — Ambulatory Visit (HOSPITAL_BASED_OUTPATIENT_CLINIC_OR_DEPARTMENT_OTHER): Payer: Commercial Managed Care - HMO

## 2008-12-19 ENCOUNTER — Ambulatory Visit (HOSPITAL_BASED_OUTPATIENT_CLINIC_OR_DEPARTMENT_OTHER): Payer: Commercial Managed Care - HMO

## 2008-12-19 VITALS — BP 120/86 | HR 74 | Temp 98.2°F | Resp 10 | Wt 203.0 lb

## 2008-12-19 DIAGNOSIS — S335XXA Sprain of ligaments of lumbar spine, initial encounter: Secondary | ICD-10-CM

## 2008-12-19 MED ORDER — MOTRIN 600 MG PO TABS
ORAL_TABLET | ORAL | Status: AC
Start: 2008-12-19 — End: 2009-03-19

## 2008-12-19 MED ORDER — OMEPRAZOLE 20 MG PO TBEC
DELAYED_RELEASE_TABLET | ORAL | Status: AC
Start: 2008-12-19 — End: 2009-06-19

## 2008-12-19 NOTE — Progress Notes (Signed)
SUBJECTIVE:   Bonnie Tucker is a 42 year old female who complains of low back pain for 1 months, positional with bending or lifting, often worse with getting out of bed int he AM, without radiation down the legs. Precipitating factors: none recalled by the patient. Prior history of back problems: no prior back problems. There is no numbness in the legs.  No incontinence.  Has tried motrin 800 bid, most days for a few weeks - notes some gi discomfort with 800 mg.  Has not tried ice, heat or stretching.  Pt states she mentioned pain at visit 11/24, but since she had the flu at that time was told perhaps it was viral myalgias. Pt states flu now resolved, but pain persists, slightly worse.      OBJECTIVE:  BP 120/86   Pulse 74   Temp (Src) 98.2 F (36.8 C) (Oral)   Resp 10   Wt 203 lb (92.08 kg)   SpO2 98%   Patient appears to be in mild to moderate pain, no antalgic gait noted. Lumbosacral spine area reveals minimal paraspinal tenderness in lumbar region, L>R, no mass.  Painful and reduced LS ROM noted. Straight leg raise is negative at 55 degrees on both sides - tight hamstrings noted. DTR's, motor strength and sensation normal, including heel and toe gait.  Peripheral pulses are palpable. X-Ray: not indicated.    ASSESSMENT:   lumbar strain    PLAN:  intermittent application of heat (do not sleep on heating pad), Discussed longer term treatment plan of prn NSAID'sm - motrin 600 tid x 7 days then prn, rx for omeprazole given in additon. and discussed a home back care exercise program with flexion exercise routine. Proper lifting with avoidance of heavy lifting discussed.   - referral to PT for core strengthening, hamstring stretching and home exercise program  - educated on nature/natural course of LBP, encouraged wt loss.   RTC in 2 months or sooner prn    D/w Lawana Pai

## 2008-12-19 NOTE — Progress Notes (Signed)
I discussed pt's care with Dr.Butts. I agree with findings and plan of care as documented in Dr.   Sampson Goon'  Note.  Some LBP started approx 1 montha go at time of viral illness.  No red flag sxs.   Muscular LBP - stretches, nsaids, physical therapy.

## 2008-12-29 ENCOUNTER — Ambulatory Visit (HOSPITAL_BASED_OUTPATIENT_CLINIC_OR_DEPARTMENT_OTHER): Payer: Commercial Managed Care - HMO

## 2009-01-09 ENCOUNTER — Ambulatory Visit (HOSPITAL_BASED_OUTPATIENT_CLINIC_OR_DEPARTMENT_OTHER): Payer: Commercial Managed Care - HMO | Admitting: Rehabilitative and Restorative Service Providers"

## 2009-01-09 ENCOUNTER — Telehealth (HOSPITAL_BASED_OUTPATIENT_CLINIC_OR_DEPARTMENT_OTHER): Payer: Self-pay | Admitting: Ambulatory Care

## 2009-01-09 DIAGNOSIS — M545 Low back pain, unspecified: Secondary | ICD-10-CM

## 2009-01-09 NOTE — Telephone Encounter (Signed)
Referral put through.  Should be able to access it in EPIC now.  It is an internal referral and shouldn't need to be faxed.

## 2009-01-09 NOTE — Patient Instructions (Addendum)
Rehabilitation Treatment Flowsheet    Precautions:    Date: 01/09/09         Initials: NLB         Visit #:          Time:          HEP YES         Treatment(s)            PT Eval complete

## 2009-01-09 NOTE — Telephone Encounter (Signed)
Staff Message copied by Vinetta Bergamo on Mon Jan 09, 2009 1:29 PM  ------   Message from: RIVERA, [place]   Created: Mon Jan 09, 2009 1:21 PM   Regarding: order    Bonnie Tucker [id number], 43 year old, female, Telephone Information:  Home Phone [phone]  Work Phone [phone]  Mobile [phone]      [name] NUMBER: [phone]  Cell phone:   Other phone:    Available times:    Patient's language of care: English    Patient does not need an interpreter.    Patient's Care Team:     Person calling on behalf of patient: [facility]    Calls today needs rx for physical therapy faxed over. She is being seen right now. F# [phone].    [facility]  Phone: [phone] Fax: [phone]

## 2009-01-09 NOTE — Progress Notes (Signed)
OUTPATIENT EVALUATION    REFERRING PROVIDER: Enis Gash. Sampson Goon, Bonnie Tucker  [address]  [facility]  [place], [place] [address]   Hx OF PRESENT ILLNESS: Pt's LB starting hurting ~ 2 months ago and was sent to PT from PCP    PRECAUTIONS:    MEDICATIONS (Rx Comments, concerns): [name]. ibuprofen   LEARNS BEST: verbal   MENTAL STATUS/COMMUNICATION: WNL     PRIMARY LANGUAGE: Spanish     REQUIRES INTERPRETER: No   INTERPRETER PRESENT DURING EVAL: No   DRESSING/GROOMING: IMPAIRED: bending  Increases back pain     DRIVING: IMPAIRED: worst in morning due to pain and stiffness     SLEEPING: IMPAIRED: worst in am  Needs motrin to sleep     POSTURE/ALIGNMENT: decreased thoracic kyphosis  Lordosis begins at t8 and descends into Lumbar area       OTHER: sit to stand is worst especially after sitting for length of time greater than 20 minutes.  Pt feels pain worst with lifting something off of floor.     NEUROLOGICAL: WNL     PALPATION: tenderness aong paraspinals throughout thoracic and L/S regions. Tenderness in sacral region B'ly in prone position, although alignment seems to be [facility].     GAIT: no issue at this point.  Does feel pain with ambulation     SKIN INTEGRITY: WNL       Please Note: Only populated fields were assessed by provider, fields left blank were not assessed.    GIRTH LEFT RIGHT GIRTH LEFT RIGHT GIRTH LEFT RIGHT                           OTHER:    USE IMAGES ACTIVITY. IMAGE AVAILABLE: No      LEFT RIGHT  LEFT RIGHT   SHOULD A/PROM MMT A/PROM MMT HIP A/PROM MMT A/PROM MMT   FLEX.     FLEX.  4/5  4/5   EXT.     EXT.       IR     IR       ER     ER       ABD     ABD       H. ABD     ADD       H. ADD     KNEE       ELBOW     FLEX  4+/5  4+/5   FLEX.     EXT  4+/5  4+/5   EXT.     ANKLE       WRIST     DF       FLEX.     PF       EXT.     IV       SUP/  PRON     EV          SPECIAL TEST LEFT RIGHT SPECIAL TEST LEFT RIGHT SPECIAL TEST LEFT RIGHT   Slump Test  - - Sqish test + +      SLR Test + +         Sacral decompression +            Physical Therapy Plan of Care    [id number] F. Sampson Goon, Bonnie Tucker  Referring Provider: Enis Gash. Butts, Bonnie Tucker    Diagnosis: 724.2AF LBP (Low Back Pain)  (primary encounter diagnosis)  Assessment/Objective Findings: Patient is a 43 year old female who reports to PT with L/S pain with insidious onset.  Pt does think that maybe repetetive lifting at work may have contributed to back pain and currently presents with pain in her bilateral Lumbar area.  Pt has pain in L/S wit SLR and with squish tests.  Femoral nv tension test also produced LBP.  Pt has very tight hamstrings Bilaterally and decreased postural awareness which also is contributing to L/S pain.  Pt will benefit from skilled PT in order to address following impairments.      Pain, Decreased ROM, Decreased Strength, Decreased Functional Mobility and Decreased Tolerance of ADLs  Patient will benefit from skilled PT to address the rehabilitation goals outlined below.    Rehabilitation Goals  Patient to be Independent with Home Exercise Program.  Duration: 2 weeks  Average low back pain will be reduced from a 8/10 to a 4/10. Duration: 4 weeks    and Lumbar spine.      Pt willl complete transfer sit to stand with min to no pain in 4 weeks.  Pt will demonstrate good postural awareness with AD's in 4 weeks.  Patient will have fair core strength in 4 weeks.  Pt. will report ability to perform all work duties with minimal pain.   Duration: 4 weeks  Pain, Decreased ROM, Decreased Strength, Decreased Functional Mobility and Decreased Tolerance of ADLs  Patient will benefit from skilled PT to address the rehabilitation goals outlined below.    Long Term Goal: Pt will return to PLOF with min to no back pain or limitations.    Treatment Plan:   ** Stretching/ROM Exercise  ** Therapeutic Exercise  ** Home Exercise Program  ** Electrical Stimulation/TENS  ** Hot/Cold Rx  ** Functional Activities  **  Patient Education      Recommend Physical Therapy be continued 2 times per week for 4 weeks.  The rehabilitation potential for this patient is good    Patient is agreeable to treatment plan and is aware of attendance policy.    Bonnie Tucker PT

## 2009-01-11 ENCOUNTER — Encounter (HOSPITAL_BASED_OUTPATIENT_CLINIC_OR_DEPARTMENT_OTHER): Payer: Self-pay | Admitting: Rehabilitative and Restorative Service Providers"

## 2009-01-12 ENCOUNTER — Ambulatory Visit (HOSPITAL_BASED_OUTPATIENT_CLINIC_OR_DEPARTMENT_OTHER): Payer: Commercial Managed Care - HMO | Admitting: Rehabilitative and Restorative Service Providers"

## 2009-01-12 DIAGNOSIS — M545 Low back pain, unspecified: Secondary | ICD-10-CM

## 2009-01-12 NOTE — Patient Instructions (Addendum)
Rehabilitation Treatment Flowsheet    Precautions:    Date: 01/09/09 01/12/09        Initials: NLB JR        Visit #:          Time:          HEP YES         Treatment(s)            PT Eval complete         SKTC  LTR  x2  x10        Hamstrings stretch in 1/2 long sitting  x2        Pelvic tilts    Increased pain        TA bracing  x5        TA bracing  with marching  x5each        Pre Mod EStim 80-150pps left sacral/pelvic region  With heat x38mins

## 2009-01-13 NOTE — Progress Notes (Signed)
Subjective:  Patient reports her pain is 8/10 with sit to stand and is 5/10 with lying down.      Objective:  Palpation:  Noted tenderness with report of pain over left S-I joint and sacral region. Refer to Rehabilitation Flow Sheet for details.    Assessment:  Tolerated treatment well.  STG's are ongoing.    Plan:  Continue with skilled PT services.

## 2009-01-17 ENCOUNTER — Ambulatory Visit (HOSPITAL_BASED_OUTPATIENT_CLINIC_OR_DEPARTMENT_OTHER): Payer: Commercial Managed Care - HMO | Admitting: Rehabilitative and Restorative Service Providers"

## 2009-01-17 DIAGNOSIS — M545 Low back pain, unspecified: Secondary | ICD-10-CM

## 2009-01-17 NOTE — Patient Instructions (Addendum)
Rehabilitation Treatment Flowsheet    Precautions:    Date: 01/09/09 01/12/09        Initials: NLB JR        Visit #:          Time:          HEP YES         Treatment(s)            PT Eval complete         SKTC  LTR  x2  x10   x10       Hamstrings stretch in 1/2 long sitting  x2 supine       Pelvic tilts    Increased pain        TA bracing  x5 supine       TA bracing  with marching  x5each TA bracing against wall       Pre Mod EStim 80-150pps left sacral/pelvic region  With heat x4mins With heat x14mins       Buttock lifts  supine   x5       Piriformis stretch with  towel   x2       Scap squeezes against wall   x10

## 2009-01-17 NOTE — Progress Notes (Signed)
Subjective:  Patient reports her back feels heavy. Reports she has back pain with transfers, lifting legs into bed.    Objective:  Refer to Rehabilitation Flow Sheet for details.    Assessment:  Had difficulty with buttock lifts, piriformis stretches and hamstrings stretches, muscle tightness with stretching. Needed assist with towel to perform piriformis stretch.  Tolerated treatment  well, except as stated with stretching.    Plan:  Continue with skilled PT services.

## 2009-01-23 ENCOUNTER — Ambulatory Visit (HOSPITAL_BASED_OUTPATIENT_CLINIC_OR_DEPARTMENT_OTHER): Payer: Commercial Managed Care - HMO | Admitting: Rehabilitative and Restorative Service Providers"

## 2009-01-26 ENCOUNTER — Ambulatory Visit (HOSPITAL_BASED_OUTPATIENT_CLINIC_OR_DEPARTMENT_OTHER): Payer: Commercial Managed Care - HMO | Admitting: Rehabilitative and Restorative Service Providers"

## 2009-01-26 DIAGNOSIS — M545 Low back pain, unspecified: Secondary | ICD-10-CM

## 2009-01-26 NOTE — Patient Instructions (Addendum)
Rehabilitation Treatment Flowsheet    Precautions:  8 visits  01/12/09-01/12/10  Date: 01/09/09 01/12/09 01/17/09 01/27/09      Initials: Keith Rake      Visit #:          Time:          HEP YES   YES - quadraped ex's      Treatment(s)            PT Eval complete         SKTC  LTR  x2  x10   x10       Hamstrings stretch in 1/2 long sitting  x2 supine Quadraped heel sitting      Pelvic tilts    Increased pain  Quadrapedcat & camel      TA bracing  x5 supine Supine x10      TA bracing  with marching  x5each TA bracing against wall TA bracing against wall      Pre Mod EStim 80-150pps left sacral/pelvic region  With heat x56mins With heat x10mins With heat x47mins      Buttock lifts  supine   x5 TA bracing with marching      Piriformis stretch with  towel   x2       Scap squeezes against wall   x10 x10

## 2009-01-27 NOTE — Progress Notes (Signed)
Subjective:  Patient reports her pain is 6/10 with pain medication.  States she took pain medication 3-4/daily now 2x/daily.  States she does not have pain on right side of low back.    Objective:  Joint mobility:  Noted limited thoracic mobility as noted with attempted trunk extension in sitting.  Improved mobility in extension with quadraped cat & camel exercise.  Refer to Rehabilitation Flow Sheet for details.    Assessment:  Required tactile and verbal cues with trunk extension exercises. Tolerated treatment well.  Needs continued postural exercises.    Plan:  Continue with skilled PT services.

## 2009-01-30 ENCOUNTER — Ambulatory Visit (HOSPITAL_BASED_OUTPATIENT_CLINIC_OR_DEPARTMENT_OTHER): Payer: Commercial Managed Care - HMO | Admitting: Rehabilitative and Restorative Service Providers"

## 2009-02-02 ENCOUNTER — Ambulatory Visit (HOSPITAL_BASED_OUTPATIENT_CLINIC_OR_DEPARTMENT_OTHER): Payer: Commercial Managed Care - HMO | Admitting: Rehabilitative and Restorative Service Providers"

## 2009-02-03 ENCOUNTER — Ambulatory Visit (HOSPITAL_BASED_OUTPATIENT_CLINIC_OR_DEPARTMENT_OTHER): Payer: Commercial Managed Care - HMO | Admitting: Family Medicine

## 2009-02-03 ENCOUNTER — Encounter (HOSPITAL_BASED_OUTPATIENT_CLINIC_OR_DEPARTMENT_OTHER): Payer: Self-pay | Admitting: Family Medicine

## 2009-02-03 VITALS — BP 120/78 | HR 76 | Temp 98.4°F | Resp 20

## 2009-02-03 DIAGNOSIS — H5789 Other specified disorders of eye and adnexa: Secondary | ICD-10-CM

## 2009-02-03 DIAGNOSIS — B349 Viral infection, unspecified: Secondary | ICD-10-CM

## 2009-02-03 DIAGNOSIS — H539 Unspecified visual disturbance: Secondary | ICD-10-CM

## 2009-02-03 DIAGNOSIS — J029 Acute pharyngitis, unspecified: Secondary | ICD-10-CM

## 2009-02-03 LAB — RAPID STREP (POINT OF CARE): STREP SCREEN: NEGATIVE

## 2009-02-03 NOTE — Progress Notes (Signed)
43 yo female here for urgent visit w/ sore throat today and chills, fever (102 yesterday per pt), chills, muscle pain. Decreased appetite. HA. Motrin helps. Couldn't work the rest of yesterday or today. Tired.   Vomited once. No sig congestion or cough.  No known ill contacts. Urinating, no diarrhea.      Past Medical History    Sleep Apnea     Comment: no longer uses CPAP - no longer has sxs of sleep apnea    GERD (Gastroesophageal Reflux Disease)     Functional Ovarian Cysts     Comment: small, seen on u/s         Past Surgical History    NO SIGNIFICANT SURGICAL HISTORY        Review of Patient's Allergies indicates:  No Known Allergies.      Current outpatient prescriptions prior to encounter:  MOTRIN 600 MG OR TABS 1 TABLET q 6 hours daily for 7 days then prn Disp: 90 Rfl: 1   OMEPRAZOLE 20 MG OR TBEC 1 TABLET DAILY Disp: 30 Rfl: 5         Family History    Cancer - Breast FamHxNeg    Asthma Sister    Alcohol/Drug Abuse FamHxNeg    Cancer - Other FamHxNeg    Diabetes FamHxNeg    Heart FamHxNeg    Lipids FamHxNeg    Mental/Emotional Disorders FamHxNeg    Stroke FamHxNeg    Pulmonary FamHxNeg    Renal FamHxNeg    TB FamHxNeg    Thyroid FamHxNeg    In Good Health Mother       Social History   Marital Status: Married  Spouse Name: N/A    Years of Education: N/A  Number of Children: N/A     Social History Main Topics   Tobacco Use: Never    Alcohol Use: No    Drug Use: Not on file    Sexually Active: Yes  Partner(s): Female    Pharmacist, hospital Protection: IUD       IHK:VQQVZD urinary issue, denies diarrhea, chronic runny eyes, no trauma    O:BP 120/78   Pulse 76   Temp 98.4 F (36.9 C)   Resp 20  General: pleasant, nad, looks age and mildly ill  HEENT: PERRL, EOMI, MMM, + oropharynx erythema or exudate, no sig cervical nodes, mild conjunctival injection bil, no sig d/c  Lungs: CTA bil, no accessory muscle use, no nasal flaring, no wheeze  Heart: S1S2 RRR no murmur  Abd: rotund, +BS, soft, nontender, no mass, no  hsm  Ext: no sig edema,dorsal pedal pulses equal and strong, full rom,   Neuro: A&Ox3, sensation intact, gait normal  Skin: warm and dry  Psych: affect appropriate    Neg rapid strep    A/P 43 yo female  1) HCM - flu shots today per pt request  2) viral syndrome - conservative measures, fluids, handout, strep test neg - sent for cx as w/ fever sore throat and no congestion. Letter given for work. Discussed anticipated course of illness and concerning s/s to watch for  3) eye - ? Dry eye vs allergic component hard to discern given viral issue - eye appt    F/up as scheduled w/ pcp, sooner prn  AVS given  Marlowe Sax, MD

## 2009-02-03 NOTE — Progress Notes (Signed)
Fever, chills X 3 days, sore throat today, vomited X 1 yesterday.

## 2009-02-03 NOTE — Progress Notes (Signed)
Influenza Vaccine Procedure  February 03, 2009    1. Has the patient received the information for the influenza vaccine? Yes    2. Does the patient have any of the following contraindications?  Allergy to eggs? No  Allergic reaction to previous influenza vaccines? No  Any other problems to previous influenza vaccines? No  Paralyzed by Guillain-Barre syndrome?  No  Current moderate or severe illness? No  Allergy to contact lens solution? No    3. The vaccine has been administered in the usual fashion.     Immunization information reviewed. Current VIS reviewed and given to patient/ guardian. Verbal assent obtained from patient/ guardian.  See immunization/Injection module or chart review for date of publication and additional information. Verbal assent obtained from patient/guardian. Comfort measures for possible side effects reviewed.

## 2009-02-05 LAB — THROAT CULTURE BETA STREP

## 2009-02-07 NOTE — Progress Notes (Addendum)
Quick Note:    Negative lab letter sent. Ishana Blades J Nevea Spiewak, MD    ______

## 2009-02-08 ENCOUNTER — Encounter (HOSPITAL_BASED_OUTPATIENT_CLINIC_OR_DEPARTMENT_OTHER): Payer: Self-pay

## 2009-02-20 ENCOUNTER — Ambulatory Visit (HOSPITAL_BASED_OUTPATIENT_CLINIC_OR_DEPARTMENT_OTHER): Payer: Commercial Managed Care - HMO

## 2009-03-28 ENCOUNTER — Ambulatory Visit: Payer: Commercial Managed Care - HMO | Admitting: Ophthalmology

## 2009-04-18 ENCOUNTER — Ambulatory Visit: Payer: Commercial Managed Care - HMO | Admitting: Ophthalmology

## 2009-04-18 ENCOUNTER — Encounter: Payer: Self-pay | Admitting: Ophthalmology

## 2009-04-18 DIAGNOSIS — H1010 Acute atopic conjunctivitis, unspecified eye: Secondary | ICD-10-CM

## 2009-04-18 DIAGNOSIS — H04129 Dry eye syndrome of unspecified lacrimal gland: Secondary | ICD-10-CM

## 2009-04-18 DIAGNOSIS — H524 Presbyopia: Secondary | ICD-10-CM

## 2009-04-18 NOTE — Nursing Note (Signed)
>>   Bonnie Tucker     Tue Apr 18, 2009  3:25 PM  Pt is complaining of dry eyes OU. She says that the outside corners are very dry and have "white" leaking from them. She cleans them a lot but says that they become very red and irritated. She wears reading glasses.

## 2009-04-18 NOTE — Patient Instructions (Signed)
Bonnie Tucker      Use over-the-counter artificial tears such as Refresh, Systane, Theratears, Soothe, or Visine Tears 4 X per day or as needed for symptoms of dry eye.     Try over the counter Zaditor for itch eyes.

## 2009-04-18 NOTE — Progress Notes (Signed)
Bonnie Tucker was seen for a first eye exam c.o dry eyes.  She can use over-the-counter artificial tears such as Refresh, Systane, Theratears, Soothe, or Visine Tears 4 X per day or as needed for symptoms of dry eye.  She also is having some symptoms of allergic conjunctivitis and can try zaditor prn.

## 2009-05-08 ENCOUNTER — Telehealth (HOSPITAL_BASED_OUTPATIENT_CLINIC_OR_DEPARTMENT_OTHER): Payer: Self-pay | Admitting: Family Medicine

## 2009-05-08 NOTE — Telephone Encounter (Signed)
Staff Message copied by Earlyne Iba on Mon May 08, 2009 3:21 PM  ------   Message from: RIVERA, [place]   Created: Mon May 08, 2009 3:03 PM   Regarding: call back    Elayjah Chaney [id number], 43 year old, female, Telephone Information:  Home Phone [phone]  Work Phone [phone]  Mobile [phone]      [name] NUMBER: [phone]  Cell phone:   Other phone:    Available times:    Patient's language of care: English    Patient does not need an interpreter.    Patient's Care Team: [facility] YELLOW TEAM    Person calling on behalf of patient: Patient (self)    Calls today with a sick call.    [facility]  Phone: [phone] Fax: [phone]

## 2009-05-08 NOTE — Telephone Encounter (Signed)
Patient has had her menses for 2 weeks  Patient states that the cramps are 8/10 when they occur  She feels weak and was unable to go to work   NO same day appts avail  Advised if S/s worsen she could go to ED  Next day appt booked

## 2009-05-09 ENCOUNTER — Ambulatory Visit (HOSPITAL_BASED_OUTPATIENT_CLINIC_OR_DEPARTMENT_OTHER): Payer: Commercial Managed Care - HMO

## 2009-05-09 ENCOUNTER — Encounter (HOSPITAL_BASED_OUTPATIENT_CLINIC_OR_DEPARTMENT_OTHER): Payer: Self-pay

## 2009-05-09 VITALS — BP 122/84 | HR 79 | Temp 98.7°F | Wt 198.0 lb

## 2009-05-09 DIAGNOSIS — N939 Abnormal uterine and vaginal bleeding, unspecified: Secondary | ICD-10-CM

## 2009-05-09 LAB — BLOOD COUNT COMPLETE AUTOMATED
HEMATOCRIT: 43.9 % (ref 36.0–48.0)
HEMOGLOBIN: 15.1 g/dl (ref 12.0–16.0)
MEAN CORP HGB CONC: 34.3 g/dl (ref 32.0–36.0)
MEAN CORPUSCULAR HGB: 32.8 pg (ref 27.0–33.0)
MEAN CORPUSCULAR VOL: 95.7 fl (ref 80.0–100.0)
MEAN PLATELET VOLUME: 7.8 fl (ref 6.4–10.8)
PLATELET COUNT: 255 10*3/uL (ref 150–400)
RBC DISTRIBUTION WIDTH: 12.5 % (ref 11.5–14.3)
RED BLOOD CELL COUNT: 4.59 M/uL (ref 4.50–5.10)
WHITE BLOOD CELL COUNT: 7.8 10*3/uL (ref 4.0–10.8)

## 2009-05-09 LAB — CHG GONADOTROPIN FOLLICLE STIMULATING HORMONE: FOLLICLE STIMULATING HORMONE: 5.15 m[IU]/mL

## 2009-05-09 LAB — THYROID SCREEN TSH REFLEX FT4: THYROID SCREEN TSH REFLEX FT4: 2.28 u[IU]/mL (ref 0.34–5.60)

## 2009-05-09 LAB — CHG ASSAY OF PROLACTIN: PROLACTIN: 6.06 ng/mL (ref 3.34–26.72)

## 2009-05-09 NOTE — Progress Notes (Signed)
.  .  .    S: 43 yo female here with heavy vaginal bleeding off and on for the last 2 wks.   Having a lot of cramping, headaches, dizziness with period.  Period has been irregular for the last 3 months.  Has mirena IUD placed just over a year ago.  At first was irregular after 2-3 months went back to normal cycle until last 3 months which have been irregular.  No new medications, no weight changes.  Pt can feel her strings of the IUD.   Denies hair loss, states sometimes feels hot when others are cold.  No breast discharge.  Has had 12 lb weight loss, not intential.      O: BP 122/84   Pulse 79   Temp (Src) 98.7 F (37.1 C) (Temporal)   Wt 198 lb (89.812 kg)   SpO2 98%  GEN: NAD  CV: RRR, no m/r/g  PULM: CTAB  ABD: +BS, NTND, no HSM, no masses    A/P:  623.8AD Abnormal Vaginal Bleeding  (primary encounter diagnosis)  Comment: Pt with new onset irregular and heavy vaginal bleeding for the last 3 months.  IUD still in place.  Will check labs for anemia due to heavy bleeding and to investigate etiology of bleeding.  Will check for thyroid dysfunction, hyperprolactinemia, and for perimenopause.  Even with normal labs pt should likely have endometrial biopsy done to r/o hyperplasia.   Plan: COMPLETE CBC, AUTOMATED, GONADOTROPIN (FSH),         THYROID SCREEN TSH, ASSAY OF PROLACTIN          RTC 2 wks.

## 2009-05-09 NOTE — Progress Notes (Signed)
PRECEPTOR NOTE  On the day of the patient's visit, I reviewed findings with the resident.  I confirm the key elements of history and physical exam as described in resident's note.  I agree with the assessment and plan as described below.  Please see resident's note for further details.

## 2009-05-10 ENCOUNTER — Encounter (HOSPITAL_BASED_OUTPATIENT_CLINIC_OR_DEPARTMENT_OTHER): Payer: Self-pay | Admitting: Family Medicine

## 2009-05-10 DIAGNOSIS — N920 Excessive and frequent menstruation with regular cycle: Secondary | ICD-10-CM | POA: Insufficient documentation

## 2009-05-30 ENCOUNTER — Encounter (HOSPITAL_BASED_OUTPATIENT_CLINIC_OR_DEPARTMENT_OTHER): Payer: Self-pay

## 2009-05-30 ENCOUNTER — Ambulatory Visit (HOSPITAL_BASED_OUTPATIENT_CLINIC_OR_DEPARTMENT_OTHER): Payer: Commercial Managed Care - HMO

## 2009-05-30 VITALS — BP 108/80 | HR 72 | Temp 98.9°F | Wt 198.0 lb

## 2009-05-30 DIAGNOSIS — M26629 Arthralgia of temporomandibular joint, unspecified side: Secondary | ICD-10-CM

## 2009-05-30 DIAGNOSIS — N920 Excessive and frequent menstruation with regular cycle: Secondary | ICD-10-CM

## 2009-05-30 NOTE — Progress Notes (Signed)
PRECEPTOR NOTE  I personally discussed and reviewed the history, physical, and plan with the resident.  Please see resident's note for further details.

## 2009-05-30 NOTE — Progress Notes (Signed)
.  .  .    S: 43 yo female here for f/u of menometorrhagia.  Had period for 2.5 wks and then stopped for a week.  Restarted 4 days ago, much lighter this time.  Still not on a monthly schedule.  Some cramping.  IUD is still in.      Pain in right ear for 3 days.  Motrin seemed to help the pain.  Worst in the morning when she opens her mouth.  No drainage from ear.      O: BP 108/80   Pulse 72   Temp (Src) 98.9 F (37.2 C) (Oral)   Wt 198 lb (89.812 kg)   SpO2 97%  GEN: NAD  HEENT: TM benign, pharynx benign, palpation fo right TMJ painful esp with opening mouth    A/P:  626.2AC Heavy Menstrual Period  (primary encounter diagnosis)  Comment: Pt labs were all normal. Will send for ultrasound at this time.  Will still consider EMB in the future if warrented.  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  Suggested she try otc mouth guard and if this does not work speak with dentist regarding her grinding.  NSAIDs to relieve the pain.  Plan:

## 2009-06-01 ENCOUNTER — Telehealth (HOSPITAL_BASED_OUTPATIENT_CLINIC_OR_DEPARTMENT_OTHER): Payer: Self-pay

## 2009-06-01 NOTE — Telephone Encounter (Signed)
Ultrasound 06/08/210

## 2009-06-06 ENCOUNTER — Ambulatory Visit: Payer: Self-pay

## 2009-06-06 LAB — US PELVIC NON-PREGNANT

## 2009-06-06 LAB — US TRANSVAGINAL NON-OB

## 2009-06-06 LAB — US 3D RENDERING WO DEDICATED WORKSTATION

## 2009-07-04 ENCOUNTER — Ambulatory Visit (HOSPITAL_BASED_OUTPATIENT_CLINIC_OR_DEPARTMENT_OTHER): Payer: Commercial Managed Care - HMO

## 2009-07-04 ENCOUNTER — Encounter (HOSPITAL_BASED_OUTPATIENT_CLINIC_OR_DEPARTMENT_OTHER): Payer: Self-pay

## 2009-07-04 DIAGNOSIS — Z30431 Encounter for routine checking of intrauterine contraceptive device: Secondary | ICD-10-CM | POA: Insufficient documentation

## 2009-08-01 ENCOUNTER — Ambulatory Visit (HOSPITAL_BASED_OUTPATIENT_CLINIC_OR_DEPARTMENT_OTHER): Payer: Commercial Managed Care - HMO | Admitting: Family Medicine

## 2009-08-01 VITALS — BP 130/90 | HR 87 | Temp 99.1°F | Wt 200.0 lb

## 2009-08-01 DIAGNOSIS — R51 Headache: Secondary | ICD-10-CM

## 2009-08-01 DIAGNOSIS — N912 Amenorrhea, unspecified: Secondary | ICD-10-CM

## 2009-08-01 DIAGNOSIS — R5383 Other fatigue: Secondary | ICD-10-CM

## 2009-08-01 LAB — URINE PREGNANCY TEST (POINT OF CARE): HCG QUALITATIVE URINE: NEGATIVE

## 2009-08-01 NOTE — Progress Notes (Signed)
Cc: headache    Hpi: pt is a 43 yo F with a h/o low back pain presenting for a headache and fatigue    1. HA  Has been going on for about 1 week. Motrin 600mg  helps. The back of her head feels heavy. She has been taking the motrin regularly. Has felt this before but not this long.    2. Fatigue  Pt has been feeling like her body is down for the past 3 days. Left work yesterday head ache is real bad, wanted to close the eyes due to tiredness. She is not sleeping well and felt a little bit dizzy this morning felt like the room was spinning. Body feels tired.  No fever or chills no weight change.    New Stress one month ago. Had prob with son, not liviing at home. Went to jail.  Eating ok. Drinking water. No sick contacts.  Lives with 51 yo daughter.Some back pain.    ROS:  Low abd pain last week. Put cold towel on it and it went away.  Periods: was having them twice monthly. Her pcp did an [place] and it looked nl. Has missed her last 2 periods LMP was June 1st. Has an IUD May really heavy period. Then June was very little. Non in July.    PMH, meds and allergies reviewed in epic.    PE:  BP 130/90   Pulse 87   Temp(Src) 99.1 F (37.3 C) (Oral)   Wt 200 lb (90.719 kg)   SpO2 96%   LMP 05/30/2009  Estimated Body mass index is 36.58 kg/(m^2) as calculated from the following:    Height as of 01/15/08: 5\' 2" (1.575 m).    Weight as of this encounter: 200 lb(90.719 kg).  Gen: Well developed Other female , cooperative, NAD  HEENT: NC/AT. PERRLA, EOMI, MMM.   Back: trapezius muscles and shoulder b/l are in spasm. Over right posterior shoulder pt is tender to palpation  Neck: full rom, supple  Resp: CTAB, no wheezes, rales, ronchi.  CV: RRR, No murmurs, rubs, or gallops.  Abd: Normoactive BS. Soft, NT/ND. No masses.  No HSM. tympanic  MSK: Strength 5/5 throughout. Normal gait.   Neuro: Alert and oriented x3, CN II-XII grossly intact. Reflexes 2+ throughout. Sensation and motor grossly intact.     A/P: Pt is a 43 yo F  with a h/o low back pain here with a tension headache, fatigue and amenorrhea.    784.0 Headache  (primary encounter diagnosis)  Comment: likely tension ha, upper back/shoulder are very tight and right side is tender. Ha in correct distribution.  1. Tylenol and motrin for pain  2. Discussed posture and relaxing the shoulder to relieve stress out of her shoulders.    626.0E Amenorrhea  Comment: likely due to mirena. Will continue to monitor  1. URINE PREGNANCY TEST (POINT OF CARE)-negative    780.79B Fatigue  Comment: unclear source. Possible viral illness, inner ear pathology causing dizziness. Will check blood work to r/o thryoid prob and check baseline labs.  1. CBC, BMP, TSH  2. Discussed warning signs, if pt should feel worse or develop fever or weakness she should RTC  3. F/u in 2 months.    Otis Dials, MD    Discussed with Dr. Azucena Fallen.

## 2009-08-01 NOTE — Progress Notes (Signed)
PRECEPTOR NOTE:  On the day of the patient's visit, I discussed the key elements of history and physical exam and I reviewed the findings with the resident.  I agree with the assessment and plan as described in their documentation.  Please see resident's note for further details.

## 2009-08-02 LAB — BLOOD COUNT COMPLETE AUTO&AUTO DIFRNTL WBC
BASOPHIL %: 0.6 % (ref 0.0–2.0)
EOSINOPHIL %: 7 % (ref 0.0–7.0)
HEMATOCRIT: 39.7 % (ref 36.0–48.0)
HEMOGLOBIN: 13.9 g/dl (ref 12.0–16.0)
LYMPHOCYTE %: 22.7 % (ref 13.0–39.0)
MEAN CORP HGB CONC: 35 g/dl (ref 32.0–36.0)
MEAN CORPUSCULAR HGB: 33.8 pg — ABNORMAL HIGH (ref 27.0–33.0)
MEAN CORPUSCULAR VOL: 96.5 fl (ref 80.0–100.0)
MEAN PLATELET VOLUME: 8 fl (ref 6.4–10.8)
MONOCYTE %: 7.3 % (ref 1.0–12.0)
NEUTROPHIL %: 62.4 % (ref 46.0–79.0)
PLATELET COUNT: 221 10*3/uL (ref 150–400)
RBC DISTRIBUTION WIDTH: 12.6 % (ref 11.5–14.3)
RED BLOOD CELL COUNT: 4.12 M/uL — ABNORMAL LOW (ref 4.50–5.10)
WHITE BLOOD CELL COUNT: 8.2 10*3/uL (ref 4.0–10.8)

## 2009-08-02 LAB — BASIC METABOLIC PANEL
ANION GAP: 4 mmol/L (ref 2–25)
BUN (UREA NITROGEN): 8 mg/dl (ref 6–20)
CALCIUM: 8.7 mg/dl (ref 8.6–10.3)
CARBON DIOXIDE: 27 mmol/L (ref 22–32)
CHLORIDE: 106 mmol/L (ref 101–111)
CREATININE: 0.7 mg/dl (ref 0.4–1.2)
ESTIMATED GLOMERULAR FILT RATE: >60 mL/min (ref >60)
Glucose Random: 66 mg/dl — ABNORMAL LOW (ref 74–160)
POTASSIUM: 4.5 mmol/L (ref 3.5–5.1)
SODIUM: 137 mmol/L (ref 135–144)

## 2009-08-02 LAB — TSH (THYROID STIMULATING HORMONE): TSH (THYROID STIM HORMONE): 1.93 u[IU]/mL (ref 0.34–5.60)

## 2009-09-25 ENCOUNTER — Encounter (HOSPITAL_BASED_OUTPATIENT_CLINIC_OR_DEPARTMENT_OTHER): Payer: Self-pay

## 2009-09-25 ENCOUNTER — Ambulatory Visit (HOSPITAL_BASED_OUTPATIENT_CLINIC_OR_DEPARTMENT_OTHER): Payer: Commercial Managed Care - HMO

## 2009-09-25 VITALS — BP 116/78 | HR 87 | Temp 98.4°F | Wt 202.0 lb

## 2009-09-25 DIAGNOSIS — B349 Viral infection, unspecified: Secondary | ICD-10-CM

## 2009-09-25 MED ORDER — ACETAMINOPHEN-CODEINE 300-60 MG PO TABS
ORAL_TABLET | ORAL | Status: AC
Start: 2009-09-25 — End: 2009-10-02

## 2009-09-25 MED ORDER — ALBUTEROL SULFATE HFA 108 (90 BASE) MCG/ACT IN AERS
INHALATION_SPRAY | RESPIRATORY_TRACT | Status: AC
Start: 2009-09-25 — End: 2009-10-25

## 2009-09-25 NOTE — Progress Notes (Signed)
SUBJECTIVE:   Bonnie Tucker is a 43 year old female who complains of sinus and nasal congestion, sore throat, fever, chills and productive cough for 7 days. She denies a history of wheezing, shortness of breath, chest pain, weakness, dizziness, nausea, vomiting, anorexia and weight loss and denies a history of asthma. Patient denies smoke cigarettes.     OBJECTIVE: BP 116/78   Pulse 87   Temp(Src) 98.4 F (36.9 C) (Temporal)   Wt 202 lb (91.627 kg)   SpO2 97%  She appears well, vital signs are as noted by the nurse. Ears normal.  Throat and pharynx normal.  Neck supple. No adenopathy in the neck. Nose is congested. Sinuses non tender. The chest is clear, without wheezes or rales.    ASSESSMENT:   viral upper respiratory illness    PLAN:  Symptomatic therapy suggested: push fluids, rest and ROV prn if symptoms persist or worsen. Call or return to clinic prn if these symptoms worsen or fail to improve as anticipated.  079.99BF Viral Illness  (primary encounter diagnosis)  Plan: ACETAMINOPHEN-CODEINE 300-60 MG OR TABS,         ALBUTEROL SULFATE HFA 108 (90 BASE) MCG/ACT IN         AERS

## 2010-01-05 ENCOUNTER — Ambulatory Visit: Payer: Self-pay

## 2010-01-08 ENCOUNTER — Encounter (HOSPITAL_BASED_OUTPATIENT_CLINIC_OR_DEPARTMENT_OTHER): Payer: Self-pay

## 2010-01-10 LAB — MA SCREENING MAMMO BILATERAL WITH CAD

## 2010-02-05 ENCOUNTER — Ambulatory Visit (HOSPITAL_BASED_OUTPATIENT_CLINIC_OR_DEPARTMENT_OTHER): Payer: Commercial Managed Care - PPO | Admitting: Family Medicine

## 2010-02-05 ENCOUNTER — Encounter (HOSPITAL_BASED_OUTPATIENT_CLINIC_OR_DEPARTMENT_OTHER): Payer: Self-pay | Admitting: Family Medicine

## 2010-02-05 VITALS — BP 110/70 | HR 90 | Temp 98.8°F | Resp 16 | Ht 63.0 in | Wt 203.0 lb

## 2010-02-05 DIAGNOSIS — Z23 Encounter for immunization: Secondary | ICD-10-CM

## 2010-02-05 NOTE — Progress Notes (Signed)
SUBJECTIVE:  Bonnie Tucker is a 44 year old female here for well woman exam.    Concerns today:   1) obesity - BMI 35, feels well but still wants to lose weight   tried exercising last year at the gym fior 3 days a week without any weight loss  Saw nutritionist a few years ago and followed her instructions but didn't lose weight  No current symptoms of OSA - no daytime tiredness or snoring      Past Medical History    MIGRAINE NOS 07/24/2006    Comment: Complex migraine - off meds    OVARIAN CYST NEC/NOS 07/24/2006    Comment: Recurrent small simple ovarian cyst - functional    OBSTRUCTIVE SLEEP APNEA, ADULT & PED 07/24/2006    Comment: Severe obstructive sleep apnea - on CPAP    Sleep apnea     Comment: no longer uses CPAP - no longer has sxs of sleep apnea    GERD (gastroesophageal reflux disease)     Functional ovarian cysts     Comment: small, seen on u/s    Wears eyeglasses     Comment: reading glasses         Past Surgical History    NO SIGNIFICANT SURGICAL HISTORY          Family History    Hypertension Mother    OTHER Father    Comment: killed by terrorists in [place]    In Good Health Brother    In Good Health Brother    Cancer - Breast FamHxNeg    Asthma Sister    Alcohol/Drug Abuse FamHxNeg    Cancer - Other FamHxNeg    Diabetes FamHxNeg    Heart FamHxNeg    Lipids FamHxNeg    Mental/Emotional Disorders FamHxNeg    Stroke FamHxNeg    Pulmonary FamHxNeg    Renal FamHxNeg    TB FamHxNeg    Thyroid FamHxNeg    In Good Health Mother         Social History   Marital Status: Married  Spouse Name: N/A    Years of Education: N/A  Number of Children: N/A     Occupational History  None on file     Social History Main Topics   Tobacco Use: Never    Alcohol Use: No    Drug Use: No    Sexually Active: Yes  Partner(s): Female    Pharmacist, hospital Protection: IUD    Comment: paraguard 2000     Other Topics Concern   None on file     Social History Narrative    ** Merged History Encounter **        **  Data from: 09/25/09 Enc Dept: [facility]        ** Data from: 01/05/07 Enc Dept: [facility]    Live w/ husband and three children. [place] [employer]       Review of Patient's Allergies indicates:   Nkda (no known drug*        Current outpatient prescriptions ordered prior to encounter:  ALBUTEROL SULFATE HFA 108 (90 BASE) MCG/ACT IN AERS 1-2 puffs q4-6 hours PRN cough/wheeze. Disp: 1 Inhaler Rfl: 0   MOTRIN 600 MG OR TABS 1 TABLET q 6 hours daily for 7 days then prn Disp: 90 Rfl: 1         ROS:  Feeling well. No dyspnea or chest pain on exertion.  No  abdominal pain, change in bowel habits, black stools.  No urinary tract symptoms. No vaginal discharge or pelvic pain. No neurological complaints.  No migraine headaches.  Occasional blood streaked stools when she strains to go to the bathroom - present for ~2 years. +constipated, slightly painful to have bowel movement.  No weight loss, fevers, change in stools, or fhx of colon cancer    OB/Gyn Hx:   OB History    Grav Para Term Preterm Abortions TAB SAB Ect Mult Living    3 3 0 0  0 0 0 0 0        Menstrual history: irregular with IUD  Last pap smear & result: 12/2007, normal HPV negative     Prior abnormals pap smears / treatment: No  Sexual history: monogamous with husband  Current contraception: IUD and n/a  Problems with contraception in the past: irregular bleeding  Unprotected intercourse since LMP: No  Concern for STIs: No    OBJECTIVE:  BP 110/70   Pulse 90   Temp(Src) 98.8 F (37.1 C) (Temporal)   Resp 16   Ht 5\' 3"  (1.6 m)   Wt 203 lb (92.08 kg)   SpO2 98%   LMP 01/18/2010  Body mass index is 35.96 kg/(m^2).  Gen: well-appearing, cooperative, comfortable, well-groomed  Eyes: conjunctivae pink, lids clear, PERRL, extraocular muscles intact  Ears: external structures normal, canals clear, tympanic membranes pearly grey with normal landmarks bilaterally  Mouth: mucus membranes moist, no lesions of lips, tongue or gums,  posterior oropharynx clear without erythema, lesions, or exudate  Heart: regular rate and rhythm, normal S1 and S2, no murmurs, rubs or gallops  Breast: symmetric, no skin changes, no nipple discharge, no dominant masses, no axillary lymphadenopathy  Lungs: clear to auscultation bilaterally  Abdomen: soft, nontender, nondistended, no organomegaly or masses  Pelvic: normal external female genitalia, no vulvar, vaginal or cervical lesions  Extremities: symmetric without deformities, well-perfused, no edema    Assessment / Plan:   1) Routine Health Maintenance  Cervical cancer screening: pap and HPV negative 12/2007  Gonorrhea / Chlamydia screening: negative 12/2007   HIV screening: low risk, deferred  Immunizations: due for flu  Depression screen: PHQ9 Negative  Healthy diet and exercise counseling: Done  Contraceptive management: Mirena IUD in place  Screen lipids / glucose: LDL normal 12/2006  Breast cancer screening: normal mammo 12/2009    2) Obesity  Increase physical activity - 30 minutes of aerobic exercise 5 times weekly  Discussed pedometer- 10,000 steps daily  Patient ed handout given on weight loss and healthy eating  Declined nutrition consult as already met with her  Switch to skim milk and whole wheat products, limit portion size  Goal 1/2 pound weight loss per week  Follow up for weight check in 3 months    3) Constipation, rectal bleeding  -docusate sodium 100mg  daily (Colace) - stool softener  -regular exercise  -increase water, fluids   -increase fiber in the diet  Follow up 3 months - if bleeding persists despite above will need colonscopy

## 2010-02-05 NOTE — Patient Instructions (Signed)
Pedometer: Goal 10,000 steps every day  Exercise: 30-60 minutes aerobic exercise 5 times a week    For Constipation:   -docusate sodium 100mg  daily (Colace) - stool softener  -regular exercise  -lots of water, fluids  -lots of fiber in the diet

## 2010-02-05 NOTE — Progress Notes (Signed)
Influenza Vaccine Procedure  February 05, 2010    1. Has the patient received the information for the influenza vaccine? Yes    2. Does the patient have any of the following contraindications?  Allergy to eggs? No  Allergic reaction to previous influenza vaccines? No  Any other problems to previous influenza vaccines? No  Paralyzed by Guillain-Barre syndrome?  No  Current moderate or severe illness? No  Allergy to contact lens solution? No      3. The vaccine has been administered in the usual fashion.     Immunization information reviewed. Current VIS reviewed and given to patient/ guardian. Verbal assent obtained from patient/ guardian.  See immunization/Injection module or chart review for date of publication and additional information. Verbal assent obtained from patient/guardian. Comfort measures for possible side effects reviewed.

## 2010-05-09 ENCOUNTER — Ambulatory Visit (HOSPITAL_BASED_OUTPATIENT_CLINIC_OR_DEPARTMENT_OTHER): Payer: Commercial Managed Care - PPO | Admitting: Family Medicine

## 2011-01-11 ENCOUNTER — Other Ambulatory Visit: Payer: Self-pay

## 2011-01-11 LAB — MA SCREENING MAMMO BILATERAL WITH CAD

## 2011-02-05 ENCOUNTER — Ambulatory Visit (HOSPITAL_BASED_OUTPATIENT_CLINIC_OR_DEPARTMENT_OTHER): Payer: Commercial Managed Care - PPO | Admitting: Family Medicine

## 2011-10-14 ENCOUNTER — Telehealth (HOSPITAL_BASED_OUTPATIENT_CLINIC_OR_DEPARTMENT_OTHER): Payer: Self-pay | Admitting: Ambulatory Care

## 2011-10-14 NOTE — Telephone Encounter (Signed)
Message [facility]    Bonnie Tucker [id number], 45 year old, female, Telephone Information:  Home Phone [phone]  Work Phone [phone]  Mobile [phone]      [name] NUMBER: [phone]  Best time to call back: anytime  Cell phone:   Other phone:    Available times:    Patient's language of care: English    Patient does not need an interpreter.    Patient's PCP: Drue Novel, MD    Person calling on behalf of patient: Patient (self)    Calls today with a sick call.  to speak to nurse only. Pt is calling because she has a lot of leg and arm pain, needs to speak with RN, please advise. Thanks    [facility]    Phone: [phone] Fax: [phone]

## 2011-10-14 NOTE — Telephone Encounter (Signed)
Call returned to pt and she said that she have been having some leg and arm pain for about 2 wks.  Pt said that she have tried ibuprofen and it has helped but she will like to make an appt to be seen because last wk the pain in her legs were so bad she could not walk.    appt booked for Wednesday pm 740

## 2011-10-16 ENCOUNTER — Ambulatory Visit (HOSPITAL_BASED_OUTPATIENT_CLINIC_OR_DEPARTMENT_OTHER): Payer: PRIVATE HEALTH INSURANCE

## 2011-10-16 VITALS — BP 120/84 | HR 80 | Temp 98.1°F | Wt 202.0 lb

## 2011-10-16 DIAGNOSIS — M2550 Pain in unspecified joint: Secondary | ICD-10-CM

## 2011-10-16 DIAGNOSIS — M791 Myalgia, unspecified site: Secondary | ICD-10-CM

## 2011-10-16 NOTE — Patient Instructions (Signed)
Heat muscles and stretch

## 2011-10-16 NOTE — Progress Notes (Signed)
PRECEPTOR NOTE  I personally discussed and reviewed the history, physical, and plan with the resident.  Please see resident's note for further details.

## 2011-10-16 NOTE — Progress Notes (Signed)
ID / CC:  Bonnie Tucker is a 45 year old female with  Patient Active Problem List:     LBP (Low Back Pain) [724.2AF]     Menorrhagia [626.2A]     Mirena IUD [V25.42]     MIGRAINE NOS [346.9]     OVARIAN CYST NEC/NOS [620.2]     OBSTRUCTIVE SLEEP APNEA, ADULT & PED [327.23]     OBESITY NOS [278.00]      who presents with pain    SUBJECTIVE:    L arm pain last approx 2 weeks ago. Started in wrist and then spread to whole arm  Lasted whole day, resolved with ibuprofen  No shoulder involvment  Never had this pain before    Tuesday morning  Started working at 7 am, felt pain in L foot   Then pain in whole of leg, including shin, thigh, and buttock region  Cause her to lose balance  When move "feels like whole leg bones hurt"  Sat & sun, pt reports swollen L ankle  Swelling resolved but the pain remains    Pt works very hard at 2 cleaning jobs, no trauma to any of the areas mentioned above    Helped with ibuprofen    1.5 weeks ago, has then felt feverish, felt whole body, fatigue,   + HA  No URI symtpoms, no dysuria, no change in BM    Grandma with arthritis with deformed bones    ROS:   ohterwise negative    MEDICATIONS:  No current outpatient prescriptions on file.    ALLERGIES:  Review of Patient's Allergies indicates:   Nkda (no known drug*        OBJECTIVE:  BP 120/84   Pulse 80   Temp(Src) 98.1 F (36.7 C) (Temporal)   Wt 202 lb (91.627 kg)   SpO2 100%  Constitutional: No acute distress, Non-toxic appearance.   HEENT: Normocephalic, Atraumatic  Neurologic:  No focal deficits noted. alert  Psychiatric: normal mood and affect. behavior is normal. Judgment and thought content normal.   MS: no swelling of feet or ankles bilaterally, no selling of knees or wrisits or elbows bilaterally. No deformities of fingers.  Full ROM LEs bilaterally as well as UEs, and full 5/5 strenght UE and LEs bilaterally. Gait normal      ASSESSMENT & PLAN:  719.40B Arthralgia  729.1B Myalgia  Comment: unclear etiology,  likely post-viral as pt appears to have been ill over 1 week ago.  Also could be muscular and related to her exertion at work despite no significant history of increasing strenuous activity at work. Could be early onset inflammatory arthritis (systemic) vs fibromyalgia.   Plan:   Patient Instructions   Heat muscles and stretch    - if doesn't improve in 1 - 2 weeks RTC for further w/u              1. The patient indicates understanding of these issues and agrees with the plan.  2.  The patient is given an After Visit Summary sheet that lists all of their medications with directions, their allergies, orders placed during this encounter, immunization dates, and follow- up instructions.  3. I reviewed the patient's medical information and medical history   4.  I reconciled the patient's medication list and prepared and supplied needed refills.  5.  I have reviewed the past medical, family, and social history sections including the medications and allergies listed in the above medical record  Electronically signed by: Jeanett Schlein, MD, 10/16/2011 8:00 PM  This note is electronically signed in the electronic medical record.

## 2011-10-28 ENCOUNTER — Encounter (HOSPITAL_BASED_OUTPATIENT_CLINIC_OR_DEPARTMENT_OTHER): Payer: Self-pay

## 2011-12-02 ENCOUNTER — Ambulatory Visit (HOSPITAL_BASED_OUTPATIENT_CLINIC_OR_DEPARTMENT_OTHER): Payer: Self-pay

## 2011-12-02 ENCOUNTER — Telehealth (HOSPITAL_BASED_OUTPATIENT_CLINIC_OR_DEPARTMENT_OTHER): Payer: Self-pay | Admitting: Registered Nurse

## 2011-12-02 NOTE — Telephone Encounter (Signed)
Daughter Bonnie Tucker calls on behalf of patient who has had a bad headache and nausea since Saturday night.  No neck stiffness;  Patient is, per Bonnie Tucker, stubborn and went to work today.    Appt. Booked with Dr. Tristan Schroeder for this evening.

## 2011-12-02 NOTE — Telephone Encounter (Signed)
Message copied by Marlowe Kays on Mon Dec 02, 2011 11:02 AM  ------       Message from: RIVER[place], [place]       Created: Mon Dec 02, 2011  9:55 AM       Regarding: sick call         Bonnie Tucker [id number], 45 year old, female, Telephone Information:       Home Phone      [phone]       Work Phone      [phone]       Mobile          [phone]                     [name] NUMBER: [phone]       Cell phone:        Other phone:              Available times:              Patient's language of care: English              Patient does not need an interpreter.              Patient's Care Team: [facility] YELLOW TEAM              Person calling on behalf of patient: Daughter, Porfirio Mylar              Calls today with a sick call. Pt having bad head aches since Saturday. She is nauseous and feeling like blacking out. Pl's call back.              [facility]              Phone: [phone] Fax: [phone]

## 2011-12-12 ENCOUNTER — Ambulatory Visit (HOSPITAL_BASED_OUTPATIENT_CLINIC_OR_DEPARTMENT_OTHER): Payer: PRIVATE HEALTH INSURANCE

## 2012-01-14 ENCOUNTER — Ambulatory Visit: Payer: Self-pay

## 2012-01-15 LAB — MA SCREENING MAMMO BILATERAL WITH CAD

## 2012-01-15 NOTE — Progress Notes (Addendum)
Quick Note:    Informed by radiology as per protocol. Marlowe Sax, MD  Forwarded to pcp as Lorain Childes. Marlowe Sax, MD    ______

## 2012-01-29 ENCOUNTER — Encounter (HOSPITAL_BASED_OUTPATIENT_CLINIC_OR_DEPARTMENT_OTHER): Payer: Self-pay

## 2012-01-29 ENCOUNTER — Ambulatory Visit (HOSPITAL_BASED_OUTPATIENT_CLINIC_OR_DEPARTMENT_OTHER): Payer: PRIVATE HEALTH INSURANCE

## 2012-01-29 VITALS — BP 120/70 | HR 87 | Temp 97.2°F | Ht 63.0 in | Wt 203.0 lb

## 2012-01-29 DIAGNOSIS — R319 Hematuria, unspecified: Secondary | ICD-10-CM | POA: Insufficient documentation

## 2012-01-29 DIAGNOSIS — E669 Obesity, unspecified: Secondary | ICD-10-CM | POA: Insufficient documentation

## 2012-01-29 DIAGNOSIS — K625 Hemorrhage of anus and rectum: Secondary | ICD-10-CM

## 2012-01-29 DIAGNOSIS — Z0000 Encounter for general adult medical examination without abnormal findings: Secondary | ICD-10-CM

## 2012-01-29 NOTE — Progress Notes (Signed)
SUBJECTIVE:  Bonnie Tucker is a 46 year old female who presents for well woman visit.     Obesity:   She has been trying to weight.   Being more active, using escalator instead of elevator.   She does not think she eats good things. She has cut back on masa, tortillas, other flour based items, rice. She is eating more vegetables now.   Attempted weight watchers, but it  Was too much work to do the points. She was able to do it for three weeks, but it was too much.   She has stopped buying junk food. Candy is her weak spot.   Wants to start walking 45 minutes a day during lunch b/c her building is enormous. Her friend laughed at the idea, but she is excited.     BRBPR  Has BM BID. Normal consistency. Has ad 3-4 episodes of BRPR with BM over past 2 months, including 2 in past week. She sees it in the toilet bowl.     ROS: N/v/d, tired, yesterday took ibuprofen, now better. No unintended weight loss, no fevers, no chills, no chest pain, no SOB, no abdominal pain, no dysuria.     OBJECTIVE:      PHYSICAL EXAM:    BP 120/70   Pulse 87   Temp(Src) 97.2 F (36.2 C) (Temporal)   Ht 5\' 3"  (1.6 m)   Wt 203 lb (92.08 kg)   BMI 35.96 kg/m2   SpO2 97%   LMP 01/22/2012  Body mass index is 35.96 kg/(m^2).    Constitutional: Well developed, Well nourished, No acute distress, Non-toxic appearance.   HEENT: Normocephalic, Atraumatic  Cardiovascular: Normal heart rate, Normal rhythm, No murmurs, No rubs, No gallops.   Thorax & Lungs: Normal breath sounds, No respiratory distress, No wheezing.  Abdomen: Soft, Non tender, No guarding, No masses, Normal bowel sounds.  Extremities: Intact distal pulses, No edema.   Neuro: No focal deficits.   Pelvic exam: exam chaperoned by nurse, normal vagina and vulva, EGBUS within normal limits, normal cervix without lesions, polyps or tenderness, uterus normal size, shape, consistency, no mass or tenderness, adnexa normal in size without mass or tenderness, pap smear done  today.    ASSESSMENT / PLAN:   Bonnie Tucker is a 46 year old female who presents with CPEX, morbid obesity.      V70.0 Routine general medical examination at a health care facility  (primary encounter diagnosis)  Comment: Lipids and A1c as patient is obese.   Plan: CHOLESTEROL, HIGH DENSITY LIPOPROTEIN, LOW         DENSITY LIPOPROTEIN,DIRECT, HEMOGLOBIN A1C,         COLLECTION VENOUS BLOOD VENIPUNCTURE     BRPBR: Diverticulosis vs hemorrhoids vs neoplasm vs angiodysplasia or other cause. Referral for colonoscopy. Anoscopy deferred as presence or absence hemorrhoids would not preclude need for colonoscopy to examine for other pathology.     This case was discussed with Dr. Lucy Antigua.     1. The patient indicates understanding of these issues and agrees with the plan.  2.  The patient is given an After Visit Summary sheet that lists all of their medications with directions, their allergies, orders placed during this encounter, immunization dates, and follow- up instructions.  3. I reviewed the patient's medical information and medical history   4.  I reconciled the patient's medication list and prepared and supplied needed refills.  5.  I have reviewed the past medical, family, and social history  sections including the medications and allergies listed in the above medical record

## 2012-01-30 LAB — CBC, PLATELET & DIFFERENTIAL
ABSOLUTE BASO COUNT: 0.1 10*3/uL (ref 0.0–0.1)
ABSOLUTE EOSINOPHIL COUNT: 0.8 10*3/uL (ref 0.0–0.8)
ABSOLUTE IMM GRAN COUNT: 0.03 10*3/uL (ref 0.00–0.03)
ABSOLUTE LYMPH COUNT: 2.1 10*3/uL (ref 0.6–5.9)
ABSOLUTE MONO COUNT: 0.6 10*3/uL (ref 0.2–1.4)
ABSOLUTE NEUTROPHIL COUNT: 5.5 10*3/uL (ref 1.6–8.3)
BASOPHIL %: 0.7 % (ref 0.0–1.2)
EOSINOPHIL %: 8.7 % — ABNORMAL HIGH (ref 0.0–7.0)
HEMATOCRIT: 42.2 % (ref 34.1–44.9)
HEMOGLOBIN: 12.7 g/dL (ref 11.2–15.7)
IMMATURE GRANULOCYTE %: 0.3 % (ref 0.0–0.4)
LYMPHOCYTE %: 23 % (ref 15.0–54.0)
MEAN CORP HGB CONC: 30.1 g/dL — ABNORMAL LOW (ref 31.0–37.0)
MEAN CORPUSCULAR HGB: 31 pg (ref 26.0–34.0)
MEAN CORPUSCULAR VOL: 102.9 fL — ABNORMAL HIGH (ref 80.0–100.0)
MEAN PLATELET VOLUME: 10.5 fL (ref 8.7–12.5)
MONOCYTE %: 6.2 % (ref 4.0–13.0)
NEUTROPHIL %: 61.1 % (ref 40.0–75.0)
PLATELET COUNT: 275 10*3/uL (ref 150–400)
RBC DISTRIBUTION WIDTH STD DEV: 52.4 fL — ABNORMAL HIGH (ref 35.1–46.3)
RBC DISTRIBUTION WIDTH: 14 % (ref 11.5–14.3)
RED BLOOD CELL COUNT: 4.1 M/uL (ref 3.90–5.20)
WHITE BLOOD CELL COUNT: 9 10*3/uL (ref 4.0–11.0)

## 2012-01-31 ENCOUNTER — Encounter (HOSPITAL_BASED_OUTPATIENT_CLINIC_OR_DEPARTMENT_OTHER): Payer: Self-pay

## 2012-01-31 LAB — HEMOGLOBIN A1C
ESTIMATED AVERAGE GLUCOSE: 111 (ref 74–160)
HEMOGLOBIN A1C: 5.5 % (ref 4.0–5.6)

## 2012-02-02 LAB — CHG LIPOPROTEIN DIR MEAS HIGH DENSITY CHOLESTEROL: HIGH DENSITY LIPOPROTEIN: 36 mg/dl (ref 35–85)

## 2012-02-02 LAB — CHG LIPOPROTEIN DIRECT MEASUREMENT LDL CHOLESTEROL: LOW DENSITY LIPOPROTEIN DIRECT: 128 mg/dl — ABNORMAL HIGH (ref 0–100)

## 2012-02-02 LAB — VITAMIN D,25 HYDROXY: VITAMIN D,25 HYDROXY: 16.4 ng/ml — CL (ref 30.0–100.0)

## 2012-02-02 LAB — CHOLESTEROL: Cholesterol: 198 mg/dl (ref 0–200)

## 2012-02-06 LAB — CYTOPATH, C/V, THIN LAYER

## 2012-02-07 LAB — HUMAN PAPILLOMAVIRUS (HPV): HUMAN PAPILLOMAVIRUS: NEGATIVE

## 2012-02-10 ENCOUNTER — Telehealth (HOSPITAL_BASED_OUTPATIENT_CLINIC_OR_DEPARTMENT_OTHER): Payer: Self-pay | Admitting: Family Medicine

## 2012-02-10 NOTE — Telephone Encounter (Signed)
T/c returned to pt's daughter who reports her Mother got a letter from Dr. Laveda Norman telling her she has an apt  On 02/13/12 in the surgical dept. @ [facility]. Byrd Hesselbach said she does not know why she is seeing a Careers adviser and is nervous.   I reviewed last office note and  Referral note. I explained to pt's daughter that her Mother is seeing Dr. Anson [place] because she reports having  Rectal bleeding and may need a Colonoscopy which  Dr. Veto Kemps can arrange.

## 2012-02-10 NOTE — Telephone Encounter (Signed)
Message copied by Jeanene Erb on Mon Feb 10, 2012 12:40 PM  ------       Message from: RIVERA, [place]       Created: Mon Feb 10, 2012 12:33 PM       Regarding: call back         Bonnie Tucker [id number], 46 year old, female, Telephone Information:       Home Phone      [phone]       Work Phone      [phone]       Mobile          [phone]                     [name] NUMBER: [phone]       Cell phone:        Other phone:              Available times:              Patient's language of care: English              Patient does not need an interpreter.              Patient's Care Team: [facility] YELLOW TEAM              Person calling on behalf of patient: Daughter, Porfirio Mylar              Calls today with questions and concerns. Re: pt's appt with general surgery. Pl's call back.              [facility]              Phone: [phone] Fax: [phone]              [facility]              Phone: [phone] Fax: [phone]

## 2012-02-13 ENCOUNTER — Ambulatory Visit (HOSPITAL_BASED_OUTPATIENT_CLINIC_OR_DEPARTMENT_OTHER): Payer: PRIVATE HEALTH INSURANCE | Admitting: Surgery

## 2012-02-13 ENCOUNTER — Encounter (HOSPITAL_BASED_OUTPATIENT_CLINIC_OR_DEPARTMENT_OTHER): Payer: Self-pay | Admitting: Surgery

## 2012-02-13 VITALS — BP 123/80 | HR 71 | Temp 98.3°F

## 2012-02-13 DIAGNOSIS — K625 Hemorrhage of anus and rectum: Secondary | ICD-10-CM

## 2012-02-13 MED ORDER — PEG 3350-KCL-NABCB-NACL-NASULF 236 G PO SOLR
4.00 L | Freq: Once | ORAL | Status: AC
Start: 2012-02-13 — End: 2012-02-13

## 2012-02-13 NOTE — Progress Notes (Signed)
HPI Comments: This 46 yo woman has had some rectal bleeding, bright red, every time she goes to the bathroom. It stopped for a while then comes back for several days at a time. It happens with bowel movements. She thinks it may be related to having hard stools. She does not generally have constipation or strain with BM. No pain with bowel movements.      Review of Systems   Constitutional: Negative for fever, chills, weight loss and malaise/fatigue.   Respiratory: Negative for shortness of breath.    Cardiovascular: Negative for chest pain.   Gastrointestinal: Negative for heartburn.   Endo/Heme/Allergies: Does not bruise/bleed easily.     Physical Exam   Constitutional: She is oriented to person, place, and time. She appears well-developed and well-nourished.   Eyes: Pupils are equal, round, and reactive to light.   Neck: No thyromegaly present.   Cardiovascular: Normal rate.    Pulmonary/Chest: Effort normal.   Abdominal: Soft. She exhibits no distension and no mass. No tenderness.   Genitourinary:        Posterior healed anal fissure with small associated anal skin tag/sentinel pile. No masses, DRE nl tone. No significant hemorrhoid disease.   Neurological: She is alert and oriented to person, place, and time.   Skin: Skin is warm and dry.   Psychiatric: She has a normal mood and affect. Her behavior is normal.         A/P: Rectal bleeding likely the result of the anal fissure which is now healed.  We discussed the nature of anal fissures and the relationship to constipation or past hard bowel movement. Typical symptoms are pain and bleeding. Goal is for consistently soft bowel movements, avoid constipation, and pain relief.    If they last longer than than about 6 weeks, a special ointment can be used to help heal it. If this fails, then sometimes a surgery is needed.    I still would recommend a colonoscopy given her age, the bleeding, and her worry  Over a friend who died 2 years ago with early colon cancer.      The patient should undergo a screening colonoscopy. We discussed the reasons for colonoscopy screening and it's role in reducing the incidence of colorectal cancer. We discussed the details of the procedure as well as the risks which include, but are not limited to, bleeding, perforation, and a missed lesion. Informed consent was obtained and written instructions were reviewed and given. We will schedule the colonoscopy at their convenience.    Use MAC sedation due to the sleep apnea although she says this is no longer the case.  Golytely.

## 2012-02-19 ENCOUNTER — Telehealth (HOSPITAL_BASED_OUTPATIENT_CLINIC_OR_DEPARTMENT_OTHER): Payer: Self-pay | Admitting: Surgery

## 2012-02-19 NOTE — Telephone Encounter (Signed)
I have confirmed with Bonnie Tucker her upcoming procedure colonoscopy for February 26, 2012 and moved up her procedure for an arrival time of 7 am. She had no questions regarding the prep at this time. I have given her our phone number [phone] to call us with any concern.

## 2012-02-20 NOTE — Progress Notes (Addendum)
PRECEPTOR NOTE   I personally reviewed patient's history and exam findings with resident Dr Tran  I confirm the key elements of history and physical exam as described in resident's note.  I agree with the assessment and plan as described by resident.  Please see resident's note for further details.

## 2012-02-25 ENCOUNTER — Telehealth (HOSPITAL_BASED_OUTPATIENT_CLINIC_OR_DEPARTMENT_OTHER): Payer: Self-pay | Admitting: Surgery

## 2012-02-25 NOTE — Telephone Encounter (Signed)
Bonnie Tucker has called to confirm her upcoming procedure of a colonoscopy scheduled for February 26, 2012 and we have gone over the instructions.

## 2012-02-26 ENCOUNTER — Encounter (HOSPITAL_BASED_OUTPATIENT_CLINIC_OR_DEPARTMENT_OTHER): Payer: Self-pay

## 2012-02-26 ENCOUNTER — Encounter (HOSPITAL_BASED_OUTPATIENT_CLINIC_OR_DEPARTMENT_OTHER): Payer: Self-pay | Admitting: Surgery

## 2012-02-26 ENCOUNTER — Ambulatory Visit (HOSPITAL_BASED_OUTPATIENT_CLINIC_OR_DEPARTMENT_OTHER)
Admit: 2012-02-26 | Disposition: A | Discharge status: Home / Self Care | Payer: Self-pay | Source: Ambulatory Visit | Attending: Surgery | Admitting: Surgery

## 2012-02-26 DIAGNOSIS — K5730 Diverticulosis of large intestine without perforation or abscess without bleeding: Secondary | ICD-10-CM | POA: Insufficient documentation

## 2012-02-26 LAB — HCG QUALITATIVE URINE: HCG QUALITATIVE URINE: NEGATIVE

## 2012-02-26 LAB — GI OPERATIVE NOTE

## 2012-02-26 MED ORDER — LIDOCAINE HCL (PF) 2 % IJ SOLN
INTRAMUSCULAR | Status: AC
Start: 2012-02-26 — End: 2012-02-26
  Filled 2012-02-26: qty 2

## 2012-02-26 MED ORDER — ONDANSETRON HCL 4 MG/2ML IJ SOLN
4.0000 mg | Freq: Once | INTRAMUSCULAR | Status: DC | PRN
Start: 2012-02-26 — End: 2012-02-26

## 2012-02-26 MED ORDER — MIDAZOLAM HCL 2 MG/2ML IJ SOLN
INTRAMUSCULAR | Status: AC
Start: 2012-02-26 — End: 2012-02-26
  Filled 2012-02-26: qty 2

## 2012-02-26 MED ORDER — HYDROMORPHONE HCL PF 1 MG/ML IJ SOLN
0.2500 mg | INTRAMUSCULAR | Status: DC | PRN
Start: 2012-02-26 — End: 2012-02-26

## 2012-02-26 MED ORDER — LACTATED RINGERS IV SOLN
INTRAVENOUS | Status: DC
Start: 2012-02-26 — End: 2012-02-26

## 2012-02-26 MED ORDER — PROPOFOL 10 MG/ML IV EMUL
INTRAVENOUS | Status: AC
Start: 2012-02-26 — End: 2012-02-26
  Filled 2012-02-26: qty 20

## 2012-02-26 MED ORDER — FENTANYL CITRATE 0.05 MG/ML IJ SOLN
INTRAMUSCULAR | Status: AC
Start: 2012-02-26 — End: 2012-02-26
  Filled 2012-02-26: qty 2

## 2012-02-26 MED ORDER — LACTATED RINGERS IV SOLN
INTRAVENOUS | Status: DC
Start: 2012-02-26 — End: 2012-02-26
  Administered 2012-02-26: 08:00:00 via INTRAVENOUS

## 2012-02-26 NOTE — H&P (Signed)
Pre-Anesthetic Note    Pre op Diagnosis: rectal bleeding    Planned Procedure: colonoscopy    Patient ID:  Bonnie Tucker is a 46 year old female  Height: 5\' 4"  (162.6 cm)  Weight: 203 lb (92.08 kg)    Previous Anesthetic History:     Past Surgical History    NO SIGNIFICANT SURGICAL HISTORY          Patient denies family complications of anesthesia.    Current Medications:      Current outpatient prescriptions ordered prior to encounter:  polyethylene glycol (GOLYTELY,NULYTELY) 236 GM suspension Take 4,000 mLs by mouth once. Disp: 4000 mL Rfl: 0     No current facility-administered medications on file prior to encounter.      Allergies:   Review of Patient's Allergies indicates:   Nkda (no known drug*        Smoking, Alcohol, Drugs:     Smoking status: Never Smoker     Smokeless tobacco:     Alcohol Use: No       Drug Use: No         PMHx:    Past Medical History    MIGRAINE NOS 07/24/2006    Comment: Complex migraine - off meds    OVARIAN CYST NEC/NOS 07/24/2006    Comment: Recurrent small simple ovarian cyst - functional    OBSTRUCTIVE SLEEP APNEA, ADULT & PED 07/24/2006    Comment: Severe obstructive sleep apnea - on CPAP    Sleep apnea     Comment: no longer uses CPAP - no longer has sxs of sleep apnea    GERD (gastroesophageal reflux disease) - pt. denies     Functional ovarian cysts     Comment: small, seen on u/s    Wears eyeglasses     Comment: reading glasses         PHYSICAL EXAMINATION:  BP 97/80   Pulse 84   Temp(Src) 96.9 F (36.1 C) (Temporal)   Resp 16   Ht 5\' 4"  (1.626 m)   Wt 203 lb (92.08 kg)   BMI 34.84 kg/m2   SpO2 99%   LMP 01/22/2012    General: WDWN    Airway Classification:  Teeth:  Intact, none loose  Mallampati: Class IV  Not even the uvula can be visualized.   Mallampati Airway Classification:   Oral Opening:  3cm  Full range of neck Motion:  Yes     Lungs: clear to auscultation bilaterally    Heart: normal and regular rate and rhythm    EKG (2009)  Vent. Rate : 072 BPM Atrial Rate : 072  BPM  P-R Int : 138 ms QRS Dur : 082 ms  QT Int : 404 ms P-R-T Axes : 038 058 072 degrees  QTc Int : 442 ms    Normal sinus rhythm  Nonspecific T wave abnormality  Abnormal ECG  No previous ECGs available    Referred By: Maebelle Munroe Overread By: Allegra Grana MD        Pertinent Labs:     Component Value Date   NA 137 08/02/2009   K 4.5 08/02/2009   CREAT 0.7 08/02/2009   GLUCOSER 66* 08/02/2009   WBC 9.0 01/30/2012   HCT 42.2 01/30/2012   PLTA 275 01/30/2012        Other Findings:works 2 jobs.  Denies SOB    ASA Assessment: III  A Patient With Sever Systemic Disease That Limits but is not Incapacitating  Emergency:  No   Potential Anesthesia Problems:  No    Plan:  MAC    Candice Camp, MD   Pager: [phone]          Alanson Aly = Mandatory Field.  For non-mandatory fields, the provider will enter relevant positive findings.

## 2012-02-26 NOTE — H&P (Signed)
Patient Assessment Update: (Fill out Prior to procedure or within 24 hours of  admission if H&P done pre-admission.)   Re-evaluation including history review and physical examination has been performed.    Patient's Condition No Change  Proceed with colonoscopy for rectal bleeding.      Jonny Ruiz, MD, 02/26/2012, 7:34 AM

## 2012-02-26 NOTE — Interval H&P Note (Signed)
History reviewed and patient examined just prior to administration of anesthesia.  BP 97/80   Pulse 84   Temp(Src) 96.9 F (36.1 C) (Temporal)   Resp 16   Ht 5\' 4"  (1.626 m)   Wt 203 lb (92.08 kg)   BMI 34.84 kg/m2   SpO2 99%   LMP 01/22/2012  Pt. NPO greater than 8 hrs.  Stable for surgery today.    Rivka Spring, MD, 02/26/2012, 7:58 AM   pager [phone]

## 2012-03-12 ENCOUNTER — Ambulatory Visit (HOSPITAL_BASED_OUTPATIENT_CLINIC_OR_DEPARTMENT_OTHER): Payer: PRIVATE HEALTH INSURANCE | Admitting: Surgery

## 2012-06-10 ENCOUNTER — Ambulatory Visit (HOSPITAL_BASED_OUTPATIENT_CLINIC_OR_DEPARTMENT_OTHER): Payer: PRIVATE HEALTH INSURANCE

## 2012-06-10 VITALS — BP 124/82 | HR 86 | Temp 96.2°F | Wt 205.0 lb

## 2012-06-10 DIAGNOSIS — R059 Cough, unspecified: Secondary | ICD-10-CM

## 2012-06-10 DIAGNOSIS — Z9109 Other allergy status, other than to drugs and biological substances: Secondary | ICD-10-CM

## 2012-06-10 DIAGNOSIS — R05 Cough: Principal | ICD-10-CM

## 2012-06-10 MED ORDER — CETIRIZINE HCL 10 MG PO TABS
10.00 mg | ORAL_TABLET | Freq: Every day | ORAL | Status: AC
Start: 2012-06-10 — End: 2012-07-10

## 2012-06-10 MED ORDER — FLUTICASONE PROPIONATE 50 MCG/ACT NA SUSP
1.00 | Freq: Every day | NASAL | Status: AC
Start: 2012-06-10 — End: 2012-07-10

## 2012-06-10 MED ORDER — ALBUTEROL SULFATE HFA 108 (90 BASE) MCG/ACT IN AERS
2.00 | INHALATION_SPRAY | RESPIRATORY_TRACT | Status: AC | PRN
Start: 2012-06-10 — End: 2012-07-10

## 2012-06-10 MED ORDER — IPRATROPIUM-ALBUTEROL 0.5-2.5 (3) MG/3ML IN SOLN
3.00 mL | Freq: Once | RESPIRATORY_TRACT | Status: AC
Start: 2012-06-10 — End: 2012-06-10
  Administered 2012-06-10: 3 mL via RESPIRATORY_TRACT

## 2012-06-10 NOTE — Progress Notes (Signed)
ID / CC:  Bonnie Tucker is a 46 year old female with  Patient Active Problem List:     Mirena IUD     MIGRAINE NOS     OVARIAN CYST NEC/NOS     Obstructive sleep apnea (adult) (pediatric)     Obesity (BMI 35.0-39.9 without comorbidity)     Hemorrhage of rectum and anus     Diverticula of colon      who presents cough    SUBJECTIVE:    Saturday started  Coughing, dry  Tight chest  + sneezing  Feeling like had troubles breathing  Little bit of wheezing, which has not happened before  No runny nose, no congestion  + fatigue, + HA  No CP, back pain with coughing  Took ibuprofen, helped HA.  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     No sick contacts  No diarreha, no constiaptino  No dysuria, no increased frequency    ROS:   ohterwise negative    MEDICATIONS:  Current outpatient prescriptions:EXPIRED: polyethylene glycol (GOLYTELY,NULYTELY) 236 GM suspension, Take 4,000 mLs by mouth once., Disp: 4000 mL, Rfl: 0    ALLERGIES:  Review of Patient's Allergies indicates:   Nkda (please verify*        OBJECTIVE:  BP 124/82   Pulse 86   Temp(Src) 96.2 F (35.7 C) (Temporal)   Wt 205 lb (92.987 kg)   BMI 35.17 kg/m2   SpO2 98%  Constitutional: No acute distress, Non-toxic   HEENT: Normocephalic, Atraumatic, boggy turbinates, TMs clear, oropharynx clear   RESP: CTAB, no w/r/r, but improved air movement after duonebs  CV: RRR, no m/g/r  ABD: +BS, soft, nontender  Neurologic:  No focal deficits noted. alert  Psychiatric: normal mood, affect, behavior, judgement, and thought content     ASSESSMENT & PLAN:  (786.2) Cough  (primary encounter diagnosis)  Comment: unclear etiolgy. dry cough, no obvoius wheezing but air moevment improved with neb thus ? RAD vs PND cough related to allergic rhinitis, ? occupational exposure as worsened with windex.   Plan: ipratropium-albuterol  (DUO-NEB) 0.5-2.5 (3)         MG/3ML nebulizer solution 3 mL, REFERRAL TO OCC        HEALTH (EXT), CANCELED:     (477.9) Environmental allergies  Comment: sneezing and allergic rhinits, will treat with flonase, zyrtec.     1. The patient indicates understanding of these issues and agrees with the plan.  2.  The patient is given an After Visit Summary sheet that lists all of their medications with directions, their allergies, orders placed during this encounter, immunization dates, and follow- up instructions.  3. I reviewed the patient's medical information and medical history   4.  I reconciled the patient's medication list and prepared and supplied needed refills.  5.  I have reviewed the past medical, family, and social history sections including the medications and allergies listed in the above medical record      Electronically signed by: Jeanett Schlein, MD, 06/10/2012 7:40 PM  This note is electronically signed in the electronic medical record.

## 2012-06-10 NOTE — Patient Instructions (Signed)
-   Call back if you haven't heard from occupation health in 2 - 3 days.

## 2012-06-10 NOTE — Progress Notes (Signed)
d 

## 2012-06-15 NOTE — Addendum Note (Signed)
Addended by: Christiana Fuchs on: 06/15/2012 09:04 AM     Modules accepted: Orders

## 2012-06-16 ENCOUNTER — Encounter (HOSPITAL_BASED_OUTPATIENT_CLINIC_OR_DEPARTMENT_OTHER): Payer: Self-pay

## 2012-06-22 NOTE — Progress Notes (Signed)
PRECEPTOR NOTE   I personally reviewed patient's history and exam findings with resident Dr Lien  I confirm the key elements of history and physical exam as described in resident's note.  I agree with the assessment and plan as described by resident.  Please see resident's note for further details.

## 2012-07-09 ENCOUNTER — Ambulatory Visit (HOSPITAL_BASED_OUTPATIENT_CLINIC_OR_DEPARTMENT_OTHER): Payer: Self-pay | Admitting: Internal Medicine

## 2012-07-09 DIAGNOSIS — R059 Cough, unspecified: Secondary | ICD-10-CM

## 2012-07-09 DIAGNOSIS — R05 Cough: Principal | ICD-10-CM

## 2012-07-09 LAB — XR CHEST 2 VIEWS

## 2012-07-10 ENCOUNTER — Ambulatory Visit (HOSPITAL_BASED_OUTPATIENT_CLINIC_OR_DEPARTMENT_OTHER): Payer: Self-pay | Admitting: Internal Medicine

## 2012-07-10 LAB — CBC, PLATELET & DIFFERENTIAL
ABSOLUTE BASO COUNT: 0.1 10*3/uL (ref 0.0–0.1)
ABSOLUTE EOSINOPHIL COUNT: 0.8 10*3/uL (ref 0.0–0.8)
ABSOLUTE IMM GRAN COUNT: 0.02 10*3/uL (ref 0.00–0.03)
ABSOLUTE LYMPH COUNT: 1.9 10*3/uL (ref 0.6–5.9)
ABSOLUTE MONO COUNT: 0.4 10*3/uL (ref 0.2–1.4)
ABSOLUTE NEUTROPHIL COUNT: 4.4 10*3/uL (ref 1.6–8.3)
BASOPHIL %: 0.8 % (ref 0.0–1.2)
EOSINOPHIL %: 10.4 % — ABNORMAL HIGH (ref 0.0–7.0)
HEMATOCRIT: 39.4 % (ref 34.1–44.9)
HEMOGLOBIN: 12.7 g/dL (ref 11.2–15.7)
IMMATURE GRANULOCYTE %: 0.3 % (ref 0.0–0.4)
LYMPHOCYTE %: 25.4 % (ref 15.0–54.0)
MEAN CORP HGB CONC: 32.2 g/dL (ref 31.0–37.0)
MEAN CORPUSCULAR HGB: 31.3 pg (ref 26.0–34.0)
MEAN CORPUSCULAR VOL: 97 fL (ref 80.0–100.0)
MEAN PLATELET VOLUME: 10.3 fL (ref 8.7–12.5)
MONOCYTE %: 5.8 % (ref 4.0–13.0)
NEUTROPHIL %: 57.3 % (ref 40.0–75.0)
PLATELET COUNT: 253 10*3/uL (ref 150–400)
RBC DISTRIBUTION WIDTH STD DEV: 46.4 fL — ABNORMAL HIGH (ref 35.1–46.3)
RBC DISTRIBUTION WIDTH: 13.1 % (ref 11.5–14.3)
RED BLOOD CELL COUNT: 4.06 M/uL (ref 3.90–5.20)
WHITE BLOOD CELL COUNT: 7.6 10*3/uL (ref 4.0–11.0)

## 2012-07-10 LAB — VITAMIN B12: VITAMIN B12: 229 pg/mL (ref 180–914)

## 2012-07-13 LAB — IMMUNOGLOBULIN E: IMMUNOGLOBULIN E: 13 IU/mL (ref 0–100)

## 2012-07-14 LAB — ALLERGENS ZONE 1
D001 D PTERONYSSINUS: <0.08 kU/L
D002 D FARINAE MITE: <0.08 kU/L
E001 CAT DANDER: <0.08 kU/L
E002 DOG EPITHELIA: <0.08 kU/L
G002 BERMUDA GRASS: <0.08 kU/L
G008 BLUEGRASS KENTUCKY: <0.08 kU/L
G017 BAHIA GRASS: <0.08 kU/L
I100 COCKROACH AMERICAN: <0.08 kU/L
M001 PENICILLIUM NOTATUM: <0.08 kU/L
M002 CLADOSPORIUM HERBARUM: <0.08 kU/L
M003 ASPERGILLUS FUMIGATUS: <0.08 kU/L
M004 MUCOR RACEMOSUS: <0.08 kU/L
M006 ALTERNARIA TENUIS: <0.08 kU/L
M010 STEMPHYLIUM BOTRYOSUM: <0.08 kU/L
T001 MAPLE/BOX ELDER: <0.08 kU/L
T006 CEDAR MOUNTAIN: <0.08 kU/L
T007 OAK WHITE: <0.08 kU/L
T008 ELM AMERICAN WHITE: <0.08 kU/L
T015 ASH WHITE: <0.08 kU/L
T030 BIRCH WHITE: <0.08 kU/L
T040 HAZELNUT TREE: <0.08 kU/L
T041 HICKORY WHITE: <0.08 kU/L
T070 WHITE MULBERRY: <0.08 kU/L
W001 RAGWEED SHORT/COMMON: <0.08 kU/L
W006 MUGWORT: <0.08 kU/L
W009 PLANTAIN ENGLISH: <0.08 kU/L
W014 PIGWEED ROUGH: <0.08 kU/L
W018 SHEEP SORREL: 0.09 kU/L — AB
W020 NETTLE: <0.08 kU/L

## 2012-07-23 ENCOUNTER — Ambulatory Visit (HOSPITAL_BASED_OUTPATIENT_CLINIC_OR_DEPARTMENT_OTHER): Payer: Self-pay | Admitting: Internal Medicine

## 2012-10-16 ENCOUNTER — Telehealth (HOSPITAL_BASED_OUTPATIENT_CLINIC_OR_DEPARTMENT_OTHER): Payer: Self-pay

## 2012-10-16 ENCOUNTER — Ambulatory Visit (HOSPITAL_BASED_OUTPATIENT_CLINIC_OR_DEPARTMENT_OTHER): Payer: PRIVATE HEALTH INSURANCE | Admitting: Physician Assistant

## 2012-10-16 VITALS — BP 128/80 | HR 63 | Temp 96.0°F | Wt 208.0 lb

## 2012-10-16 DIAGNOSIS — T148XXA Other injury of unspecified body region, initial encounter: Secondary | ICD-10-CM

## 2012-10-16 MED ORDER — IBUPROFEN 800 MG PO TABS
800.00 mg | ORAL_TABLET | Freq: Four times a day (QID) | ORAL | Status: AC | PRN
Start: 2012-10-16 — End: 2012-10-23

## 2012-10-16 NOTE — Patient Instructions (Addendum)
Index    Index    Index    Index    Index    [id number]
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
## 2013-02-02 ENCOUNTER — Ambulatory Visit: Payer: Self-pay

## 2013-02-05 LAB — MA SCREENING MAMMO BILATERAL WITH CAD

## 2013-03-03 ENCOUNTER — Telehealth (HOSPITAL_BASED_OUTPATIENT_CLINIC_OR_DEPARTMENT_OTHER): Payer: Self-pay

## 2013-03-03 DIAGNOSIS — H579 Unspecified disorder of eye and adnexa: Secondary | ICD-10-CM

## 2013-03-03 NOTE — Progress Notes (Signed)
Per Theora Gianotti:   Bonnie Tucker who needs to get a referral to the [facility] because she is having some difficulty seeing, it is hard for her to read and to read for extended periods of time (if she tries reading more than 5-10 minutes her eyes start to water really bad and ultimately gets a really bad headache as a result).    Will place referral. Question of presbyopia.

## 2013-03-16 ENCOUNTER — Encounter (HOSPITAL_BASED_OUTPATIENT_CLINIC_OR_DEPARTMENT_OTHER): Payer: Self-pay

## 2013-03-30 ENCOUNTER — Ambulatory Visit (HOSPITAL_BASED_OUTPATIENT_CLINIC_OR_DEPARTMENT_OTHER): Payer: PRIVATE HEALTH INSURANCE

## 2013-03-30 ENCOUNTER — Encounter (HOSPITAL_BASED_OUTPATIENT_CLINIC_OR_DEPARTMENT_OTHER): Payer: Self-pay

## 2013-03-30 VITALS — BP 122/78 | HR 93 | Temp 98.9°F | Wt 208.0 lb

## 2013-03-30 DIAGNOSIS — J029 Acute pharyngitis, unspecified: Secondary | ICD-10-CM

## 2013-03-30 DIAGNOSIS — J988 Other specified respiratory disorders: Secondary | ICD-10-CM

## 2013-03-30 DIAGNOSIS — B9789 Other viral agents as the cause of diseases classified elsewhere: Secondary | ICD-10-CM

## 2013-03-30 LAB — RAPID STREP (POINT OF CARE): STREP SCREEN: NEGATIVE

## 2013-03-30 MED ORDER — BENZONATATE 200 MG PO CAPS
200.00 mg | ORAL_CAPSULE | Freq: Three times a day (TID) | ORAL | Status: AC | PRN
Start: 2013-03-30 — End: 2013-04-06

## 2013-03-30 NOTE — Progress Notes (Signed)
CC:  Patient presents with:     SUBJECTIVE  Bonnie Tucker is a 47 year old female    With the following problem list  Patient Active Problem List:     Mirena IUD     MIGRAINE NOS     OVARIAN CYST NEC/NOS     Obstructive sleep apnea (adult) (pediatric)     Obesity (BMI 35.0-39.9 without comorbidity)     Hemorrhage of rectum and anus     Diverticula of colon      Who presents for the following today:    1)Headache  -started having headache 3 days ago   -pressure, band like headache that is worsening over the past day  -taking Ibuprofen 800 mg with minimal relief  -heating pad on neck and head with relief  -wakes up and is very dizzy  -also having some nausea, vomiting only when eating food  -last time she ate food was yesterday, with vomiting   -runny nose, nasal congestion, sore throat, chills  -cough that is worse at night, difficulty sleeping   -no ear pain  -no constipation,diarrhea, blood in stools, hemoptysis  -no abdominal pain  -no fevers, unintentional weight changes              Medications:  Current outpatient prescriptions:polyethylene glycol (GOLYTELY,NULYTELY) 236 GM suspension, Take 4,000 mLs by mouth once., Disp: 4000 mL, Rfl: 0      Allergies:  Review of Patient's Allergies indicates:   Nkda (please verify*            OBJECTIVE    PHYSICAL EXAM:  Vital Signs  BP 122/78   Pulse 93   Temp(Src) 98.9 F (37.2 C) (Temporal)   Wt 208 lb (94.348 kg)   BMI 35.69 kg/m2   SpO2 97%   LMP 02/03/2013   Most Recent Weight Reading(s)  03/30/13 : 208 lb (94.348 kg)  10/16/12 : 208 lb (94.348 kg)  06/10/12 : 205 lb (92.987 kg)  02/26/12 : 203 lb (92.08 kg)  01/29/12 : 203 lb (92.08 kg)          Physical Exam   Constitutional: She appears well-developed and well-nourished. She is cooperative. No distress.   Well appearing, overweight middle aged female.    HENT:   Head: Normocephalic and atraumatic.   Nose: Mucosal edema and rhinorrhea present. Right sinus exhibits no maxillary sinus tenderness and no frontal  sinus tenderness. Left sinus exhibits no maxillary sinus tenderness and no frontal sinus tenderness.   Mouth/Throat: Uvula is midline. Posterior oropharyngeal edema and posterior oropharyngeal erythema present. No oropharyngeal exudate.   Eyes: Conjunctivae and EOM are normal. Pupils are equal, round, and reactive to light.   Neck: Normal range of motion. Neck supple. No spinous process tenderness and no muscular tenderness present. No Brudzinski's sign noted.   Cardiovascular: Normal rate, regular rhythm, S1 normal and S2 normal.  Exam reveals no gallop.    No murmur heard.  Pulmonary/Chest: Effort normal. She has no decreased breath sounds. She has no wheezes. She has no rhonchi.   Abdominal: Normal appearance and bowel sounds are normal. There is no tenderness.       ASSESSMENT/PLAN  (462) Acute pharyngitis  (primary encounter diagnosis)  Comment: Rapid strep negative. Likely viral in origin.   Plan:   -check THROAT CULTURE, BETA STREP  -push fluids, ibuprofen prn pain  -return to care if symptoms persist or worsen    (079.99) Viral respiratory illness  Comment: Unlikely meningitis given physical appearance.  Red flags discussed with patient and reasons to go to ED.   Plan:  -start benzonatate (TESSALON) 200 MG capsule for cough   -return to care if symptoms persist or worsen.        1. The patient indicates understanding of these issues and agrees with the plan.  2.  The patient is given an After Visit Summary sheet that lists all of their medications with directions, their allergies, orders placed during this encounter, immunization dates, and follow- up instructions.  3. I reviewed the patient's medical information and medical history   4.  I reconciled the patient's medication list and prepared and supplied needed refills.  5.  I have reviewed the past medical, family, and social history sections including the medications and allergies listed in the above medical record  Latricia Cerrito PA-C, 03/30/2013, 6:43  PM

## 2013-04-01 LAB — THROAT CULTURE BETA STREP

## 2013-04-14 ENCOUNTER — Ambulatory Visit (HOSPITAL_BASED_OUTPATIENT_CLINIC_OR_DEPARTMENT_OTHER): Payer: Self-pay | Admitting: Ophthalmology

## 2013-07-29 ENCOUNTER — Ambulatory Visit (HOSPITAL_BASED_OUTPATIENT_CLINIC_OR_DEPARTMENT_OTHER): Payer: PRIVATE HEALTH INSURANCE

## 2013-07-29 ENCOUNTER — Encounter (HOSPITAL_BASED_OUTPATIENT_CLINIC_OR_DEPARTMENT_OTHER): Payer: Self-pay | Admitting: Family Medicine

## 2013-07-29 VITALS — BP 120/78 | HR 73 | Temp 97.0°F | Ht 63.0 in | Wt 210.0 lb

## 2013-07-29 DIAGNOSIS — N920 Excessive and frequent menstruation with regular cycle: Secondary | ICD-10-CM | POA: Insufficient documentation

## 2013-07-29 DIAGNOSIS — E669 Obesity, unspecified: Secondary | ICD-10-CM

## 2013-07-29 LAB — THYROID SCREEN TSH REFLEX FT4: THYROID SCREEN TSH REFLEX FT4: 2.78 u[IU]/mL (ref 0.358–3.740)

## 2013-07-29 LAB — CBC WITH PLATELET
HEMATOCRIT: 39.8 % (ref 34.1–44.9)
HEMOGLOBIN: 13.3 g/dL (ref 11.2–15.7)
MEAN CORP HGB CONC: 33.4 g/dL (ref 31.0–37.0)
MEAN CORPUSCULAR HGB: 32.1 pg (ref 26.0–34.0)
MEAN CORPUSCULAR VOL: 96.1 fL (ref 80.0–100.0)
MEAN PLATELET VOLUME: 9.9 fL (ref 8.7–12.5)
PLATELET COUNT: 243 10*3/uL (ref 150–400)
RBC DISTRIBUTION WIDTH STD DEV: 44.5 fL (ref 35.1–46.3)
RBC DISTRIBUTION WIDTH: 12.8 % (ref 11.5–14.3)
RED BLOOD CELL COUNT: 4.14 M/uL (ref 3.90–5.20)
WHITE BLOOD CELL COUNT: 7.7 10*3/uL (ref 4.0–11.0)

## 2013-07-29 LAB — LOW DENSITY LIPOPROTEIN DIRECT: LOW DENSITY LIPOPROTEIN DIRECT: 107 mg/dL (ref 0–189)

## 2013-07-29 LAB — CHOLESTEROL: Cholesterol: 178 mg/dL (ref 0–239)

## 2013-07-29 LAB — HIGH DENSITY LIPOPROTEIN: HIGH DENSITY LIPOPROTEIN: 28 mg/dL — ABNORMAL LOW (ref 40–60)

## 2013-07-29 NOTE — Patient Instructions (Addendum)
Menorrhagia  Dysfunctional uterine bleeding is different from a normal menstrual period. When periods are heavy or there is more bleeding than is usual for you, it is called menorrhagia. It may be caused by hormonal imbalance, or physical, metabolic, or other problems. Examination is necessary in order that your caregiver may treat treatable causes. If this is a continuing problem, a D&C may be needed. That means that the cervix (the opening of the uterus or womb) is dilated (stretched larger) and the lining of the uterus is scraped out. The tissue scraped out is then examined under a microscope by a specialist (pathologist) to make sure there is nothing of concern that needs further or more extensive treatment.  HOME CARE INSTRUCTIONS    If medications were prescribed, take exactly as directed. Do not change or switch medications without consulting your caregiver.   Long term heavy bleeding may result in iron deficiency. Your caregiver may have prescribed iron pills. They help replace the iron your body lost from heavy bleeding. Take exactly as directed. Iron may cause constipation. If this becomes a problem, increase the bran, fruits, and roughage in your diet.   Do not take aspirin or medicines that contain aspirin one week before or during your menstrual period. Aspirin may make the bleeding worse.   If you need to change your sanitary pad or tampon more than once every 2 hours, stay in bed and rest as much as possible until the bleeding stops.   Eat well-balanced meals. Eat foods high in iron. Examples are leafy green vegetables, meat, liver, eggs, and whole grain breads and cereals. Do not try to lose weight until the abnormal bleeding has stopped and your blood iron level is back to normal.  SEEK MEDICAL CARE IF:    You need to change your sanitary pad or tampon more than once an hour.   You develop nausea (feeling sick to your stomach) and vomiting, dizziness, or diarrhea while you are taking your  medicine.   You have any problems that may be related to the medicine you are taking.  SEEK IMMEDIATE MEDICAL CARE IF:    You have a fever.   You develop chills.   You develop severe bleeding or start to pass blood clots.   You feel dizzy or faint.  MAKE SURE YOU:    Understand these instructions.   Will watch your condition.   Will get help right away if you are not doing well or get worse.  Document Released: 12/16/2005 Document Revised: 03/09/2012 Document Reviewed: 08/05/2008  [facility] Patient Information 2013 [place], [place].    How Do I Sign Up for My[facility]?  In your internet browser, go to [email or web address]  Click on the "Click Here" link below the Sign In box. You will see the New Member Sign Up page.  Enter your MyCHArt Activation Code exactly as it appears below. You will not need to use this code after youve completed the sign-up process.     MyCHArt Activation Code (Expires after 90 days): [email or web address]  Expires: 10/27/2013  4:10 PM    Your Medical Record Number: [id number]    1. Enter your myCHArt Activation Code exactly as it appears above.  2. Enter all 10 digits of your Medical Record Number.  3. Enter your Date of Birth (mm/dd/yyyy) as indicated and click Submit.  4. Create a MyCHArt Username. This will be your permanent MyCHArt login username and cannot be changed, so think of one that is secure and easy to remember.  5. Create a Clinical biochemist. Passwords must be at least 6 charaters long, include at least one letter and one number.  Passwords are case-sensitive.  You can change your password at any time.  6. Enter your Password Reset Question and Answer. This can be used at a later time if you forget your password.   7. Enter your e-mail address. You will receive an e-mail notification when new information is available in MyCHArt.  8. Click the Sign In button.    Once you have signed in using the computer, you can go to your phone or mobile device Apps store and  download the MyCHArt App for FREE.  You can Message your provider, Renew prescriptions and view your record using the MyCHArt App.    If you have questions, you can call the clinic at [phone] to talk to our [facility] staff.     Remember, MyCHArt is NOT to be used for urgent needs. For medical emergencies, dial 911.

## 2013-07-29 NOTE — Progress Notes (Signed)
[facility] - NEW PATIENT VISIT    What is something about you that you think a doctor taking care of you should know?   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 female who presents with the following concerns:     #) Heavy periods:   - over past 3 months  - Mirena for 10 years, changed to 5 years in 01/2008   - every 30 days, but period about a year ago stopped for 2 years ago for 1 year, then regular but lighter  - very bad cramps  - changing super pads 4 times a day  - no clotting  - some headaches, dizziness, never PMS symptoms before  - never happened before   - can't feel strings of IUD  - lasts for 3 days, no spotting   - LMP: 7/14   - no hot or cold intolerance, no changes in hair or skin, recent weight gain (eats too much)   - h/o increased increased vaginal bleeding in 2010, endometrial biopsy recommended but didn't have  - sexually active, 3 children and 3 grand-children   - no family h/o of diabetes  - no history of fibroid or polyp   - no h/o bleeding d/o   - no family h/o of endometrial or uterine cancer       Past Medical History    MIGRAINE NOS 07/24/2006    Comment: Complex migraine - off meds    OVARIAN CYST NEC/NOS 07/24/2006    Comment: Recurrent small simple ovarian cyst - functional    OBSTRUCTIVE SLEEP APNEA, ADULT & PED 07/24/2006    Comment: Severe obstructive sleep apnea - on CPAP    Sleep apnea     Comment: no longer uses CPAP - no longer has sxs of sleep apnea    GERD (gastroesophageal reflux disease)     Functional ovarian cysts     Comment: small, seen on u/s    Wears eyeglasses     Comment: reading glasses         Past Surgical History    NO SIGNIFICANT SURGICAL HISTORY         Social History   Marital Status: Married  Spouse Name: N/A    Years of Education: N/A  Number of Children: N/A     Occupational History  None on file     Social History Main Topics   Smoking status: Never Smoker     Smokeless tobacco: Not on file     Alcohol Use: No    Drug Use: No    Sexually Active: Yes  Partner(s): Female    Pharmacist, hospital Protection: IUD    Comment: paraguard 2000     Other Topics Concern   None on file     Social History Narrative    She is a Engineer, water, 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     Says it's not too stressful.     Married.     Has two adult children.        Family History    Hypertension Mother     OTHER Father     Comment: killed by terrorists [place]    In Good Health Brother     In Good Health Brother     Cancer - Breast FamHxNeg     Asthma Sister     Alcohol/Drug Abuse FamHxNeg  Cancer - Other FamHxNeg     Diabetes FamHxNeg     Heart FamHxNeg     Lipids FamHxNeg     Mental/Emotional Disorders FamHxNeg     Stroke FamHxNeg     Pulmonary FamHxNeg     Renal FamHxNeg     TB FamHxNeg     Thyroid FamHxNeg     In Good Health Mother          Outpatient Encounter Prescriptions as of 07/29/2013:  polyethylene glycol (GOLYTELY,NULYTELY) 236 GM suspension Take 4,000 mLs by mouth once. Disp: 4000 mL Rfl: 0     No facility-administered encounter medications on file as of 07/29/2013.    Review of Patient's Allergies indicates:   Nkda [please verify*        OBJECTIVE:  Vitals: BP 120/78   Pulse 73   Temp(Src) 97 F (36.1 C) (Temporal)   Ht 5\' 3"  (1.6 m)   Wt 210 lb (95.255 kg)   BMI 37.21 kg/m2   SpO2 100%  General: obese, well-appearing, very pleasant   Neck: No cervical LAD, thyroid not palpable  CV: Regular rate, normal rhythm, no murmurs heard  Resp: Lungs CTAB, no wheeze, no crackles, good air entry  Abd: Soft, non-tender, non-distended, no organomegaly, +BS  Ext: 2+ pulses in all extremities, no edema  GU: no external lesions, no gross blood, multiparious cervix, not friable, IUD strings in place   Skin: No rashes or lesions     ASSESSMENT/PLAN:     Bonnie Tucker is a 47y.o. female with     (626.2) Menorrhagia  (primary encounter diagnosis)  Comment: recent 3 month change in menstruation with  increase in ovulatory bleeding. DDx includes hypothyroidism (but no history or reported sx), bleeding d/o (but no personal or family hx), polyp or fibroid, endometrial cancer (but no family history), not likely due to her IUD as has been in place for 5 1/2 years   Plan:   - THYROID SCREEN TSH REFLEX FT4  - CBC + PLT  - US PELVIC NONOBSTETRIC REAL-TIME IMAGE COMPLETE ULTRASOUND TRANSVAGINAL to assess for fibroids/endometrial stripe/IUD placement   - next steps pending [place] results     (278.00) Obesity (BMI 35.0-39.9 without comorbidity)  Comment: obese and recent increase in weight   Plan:   - CHOLESTEROL, HIGH DENSITY LIPOPROTEIN, LOW DENSITY LIPOPROTEIN,DIRECT  - HEMOGLOBIN A1C  - counseling re nutrition and physical activity     I have reviewed the past medical, surgical, social and family history and updated these sections of EpicCare as relevant. All interim labs, test results, and consult notes were reviewed and discussed with Bonnie Tucker. Medications were reconciled during this visit and a current medication list was given to the patient at the end of the visit.    We discussed the patients current medications. The patient expressed understanding and no barriers to adherence were identified.     Discussed with Dr. Marsh Dolly, MD

## 2013-07-29 NOTE — Progress Notes (Signed)
PRECEPTOR NOTE  On the day of the patient's visit, I reviewed findings with the resident.  I confirm the key elements of history and physical exam as described in resident's note.  I agree with the assessment and plan as described below.  Please see resident's note for further details.

## 2013-07-30 ENCOUNTER — Encounter (HOSPITAL_BASED_OUTPATIENT_CLINIC_OR_DEPARTMENT_OTHER): Payer: Self-pay

## 2013-07-30 LAB — HEMOGLOBIN A1C
ESTIMATED AVERAGE GLUCOSE: 111 (ref 74–160)
HEMOGLOBIN A1C: 5.5 % (ref 4.0–5.6)

## 2013-08-02 ENCOUNTER — Ambulatory Visit: Payer: Self-pay

## 2013-08-02 DIAGNOSIS — N920 Excessive and frequent menstruation with regular cycle: Secondary | ICD-10-CM

## 2013-08-02 LAB — US PELVIC NON-PREGNANT

## 2013-08-02 LAB — US 3D RENDERING WO DEDICATED WORKSTATION

## 2013-08-02 LAB — US TRANSVAGINAL NON-OB

## 2013-08-10 ENCOUNTER — Telehealth (HOSPITAL_BASED_OUTPATIENT_CLINIC_OR_DEPARTMENT_OTHER): Payer: Self-pay

## 2013-08-10 NOTE — Progress Notes (Signed)
Situation: 47 yo female with Mirena IUD in place for the past 5.5 yrs with menorrhagia.   Background:   - [place] (Pelvic, Transvaginal): IUD in normal position. Uterus and ovaries normal. Endometrial stripe 2 mm.   - Informal consult with Dr Bonnie Tucker (OB/GYN) who recommended removal of IUD.     Brief chart review:  Patient Active Problem List:     Mirena IUD     MIGRAINE NOS     OVARIAN CYST NEC/NOS     Obstructive sleep apnea (adult) (pediatric)     Obesity (BMI 35.0-39.9 without comorbidity)     Hemorrhage of rectum and anus     Diverticula of colon     Excessive or frequent menstruation      Current Outpatient Prescriptions on File Prior to Visit:  polyethylene glycol (GOLYTELY,NULYTELY) 236 GM suspension Take 4,000 mLs by mouth once. Disp: 4000 mL Rfl: 0     No current facility-administered medications on file prior to visit.   Review of Patient's Allergies indicates:   Nkda [please verify*       No results found for this visit on 08/10/13 (from the past 24 hour(s)).     Assessment: 47 yo female with recent change in her menstrual cycle over the past several month most likely due to use of Mirena past the recommended 5 yr mark.   Response:   - left VM for Bonnie Tucker requesting her to call [facility] to schedule an appointment for Mirena removal   - at that time, we can discuss whether it makes sense to replace Mirena vs other birth control option     Bonnie Gaster, MD

## 2013-08-12 ENCOUNTER — Encounter (HOSPITAL_BASED_OUTPATIENT_CLINIC_OR_DEPARTMENT_OTHER): Payer: Self-pay | Admitting: Family Medicine

## 2013-08-12 ENCOUNTER — Ambulatory Visit (HOSPITAL_BASED_OUTPATIENT_CLINIC_OR_DEPARTMENT_OTHER): Payer: PRIVATE HEALTH INSURANCE | Admitting: Family Medicine

## 2013-08-12 VITALS — BP 108/70 | HR 84 | Temp 97.1°F | Wt 207.6 lb

## 2013-08-12 DIAGNOSIS — J45998 Other asthma: Secondary | ICD-10-CM

## 2013-08-12 DIAGNOSIS — J069 Acute upper respiratory infection, unspecified: Secondary | ICD-10-CM

## 2013-08-12 MED ORDER — ALBUTEROL SULFATE HFA 108 (90 BASE) MCG/ACT IN AERS
2.00 | INHALATION_SPRAY | RESPIRATORY_TRACT | Status: AC | PRN
Start: 2013-08-12 — End: 2013-08-26

## 2013-08-12 MED ORDER — FLUTICASONE PROPIONATE HFA 44 MCG/ACT IN AERO
1.00 | INHALATION_SPRAY | Freq: Two times a day (BID) | RESPIRATORY_TRACT | Status: AC
Start: 2013-08-12 — End: 2013-08-26

## 2013-08-12 NOTE — Patient Instructions (Signed)
Index Spanish version

## 2013-08-12 NOTE — Progress Notes (Signed)
Bonnie Tucker is a 47 year old female here for: Cough    Patient Active Problem List:     Mirena IUD     MIGRAINE NOS     OVARIAN CYST NEC/NOS     Obstructive sleep apnea (adult) (pediatric)     Obesity (BMI 35.0-39.9 without comorbidity)     Hemorrhage of rectum and anus     Diverticula of colon     Excessive or frequent menstruation    HPI:  1. HPI:  1. Bad cold and headache  -began 3 days ago  -has sore throat, runny nose, headache, and dry cough  -took ibuprofen which helped with headache but cough persists  -has had bad cough x 2 nights, with chest tightness and difficulty breathing, with bilateral posterior rib pain  -yesterday had temp of 103  -unable to work yesterday and today due to symptoms  -feeling fatigued and dizzy, nausea  -she denies: hemoptysis, vomiting, abdominal pain, or diarrhea  -has family hx asthma and worried that she may be having asthma  -has been using some home remedies - ginger ale, honey, camomile tea  -sick contacts: none, works in an office  -travel: none  -smoking status: none    MEDICATIONS:   Current Outpatient Prescriptions on File Prior to Visit:  polyethylene glycol (GOLYTELY,NULYTELY) 236 GM suspension Take 4,000 mLs by mouth once. Disp: 4000 mL Rfl: 0     No current facility-administered medications on file prior to visit.  PAST MEDICAL, SURGICAL, FAMILY, SOCIAL, IMMUNIZATION, HEALTH MAINT HISTORY: The following sections have been reviewed and updated in Epic:  past medical history and family history.    PE:  BP 108/70   Pulse 84   Temp(Src) 97.1 F (36.2 C) (Oral)   Wt 207 lb 9.6 oz (94.167 kg)   BMI 36.78 kg/m2   SpO2 100%  General: tired appearing, dry cough noted while speaking  HEENT: Normocephalic/atraumatic. EOMI. No scleral icterus or injection. Ears: EAC clear, TM shiny/gray/clear.  Nares: clear mucus on turbinates.  OP: clear.  Mucous membranes are moist.   Neck: supple, no lymphadenopathy.  Lungs: clear to auscultation bilat, no crackles/wheezes/rhonchi.  No  egophony.  Dry cough noted during exam.  CV: regular rate and rhythm, S1 & S2 normal, no murmurs/rubs/gallops  Extrem: warm, well perfused, no edema.    Assessment & Plan:  1. Acute URI with reactive airway disease  -febrile yesterday which is concerning for PNA, but afebrile today with normal VS and normal lung exam  -rx albuterol q4h prn  -rx fluticasone (FLOVENT HFA) 44 MCG/ACT inhaler, 1 puff bid x 2 weeks; rinse mouth after use  -reviewed warning signs of worsening illness for which she should seek further medical care

## 2013-08-16 ENCOUNTER — Ambulatory Visit (HOSPITAL_BASED_OUTPATIENT_CLINIC_OR_DEPARTMENT_OTHER): Payer: PRIVATE HEALTH INSURANCE

## 2013-08-16 ENCOUNTER — Encounter (HOSPITAL_BASED_OUTPATIENT_CLINIC_OR_DEPARTMENT_OTHER): Payer: Self-pay

## 2013-08-16 VITALS — BP 128/84 | HR 81 | Temp 96.8°F | Wt 210.0 lb

## 2013-08-16 DIAGNOSIS — N920 Excessive and frequent menstruation with regular cycle: Secondary | ICD-10-CM

## 2013-08-16 DIAGNOSIS — Z30431 Encounter for routine checking of intrauterine contraceptive device: Secondary | ICD-10-CM

## 2013-08-16 NOTE — Progress Notes (Signed)
CC:  Patient presents with:     SUBJECTIVE  Bonnie Tucker is a 47 year old female    With the following problem list  Patient Active Problem List:     Mirena IUD     MIGRAINE NOS     OVARIAN CYST NEC/NOS     Obstructive sleep apnea (adult) (pediatric)     Obesity (BMI 35.0-39.9 without comorbidity)     Hemorrhage of rectum and anus     Diverticula of colon     Excessive or frequent menstruation      Who presents for the following today:    1) Excessive or frequent menstruation  -had pelvic ultrasound a few days ago showing IUD in normal position  -no anatomical abnormalities  -OB/GYN encouraged her to have removal of IUD  -here for IUD removal today  -unfortunately this provider cannot do this for her today   -Patient is upset because she sees someone different at every appointment  -she plans to transfer care to [facility]   -no abdominal pain, abnormal vaginal discharge      Medications:  Current outpatient prescriptions:albuterol (PROVENTIL HFA,VENTOLIN HFA, PROAIR HFA) 108 (90 BASE) MCG/ACT inhaler, Inhale 2 puffs into the lungs every 4 (four) hours as needed for Wheezing or Shortness of breath., Disp: 1 Inhaler, Rfl: 0;  fluticasone (FLOVENT HFA) 44 MCG/ACT inhaler, Inhale 1 puff into the lungs 2 (two) times daily., Disp: 1 Inhaler, Rfl: 0  polyethylene glycol (GOLYTELY,NULYTELY) 236 GM suspension, Take 4,000 mLs by mouth once., Disp: 4000 mL, Rfl: 0      Allergies:  Review of Patient's Allergies indicates:   Nkda [please verify*            OBJECTIVE    PHYSICAL EXAM:  Vital Signs  BP 128/84   Pulse 81   Temp(Src) 96.8 F (36 C) (Temporal)   Wt 210 lb (95.255 kg)   BMI 37.21 kg/m2   SpO2 97%           Physical Exam   Constitutional: She is oriented to person, place, and time. She appears well-developed and well-nourished. She is cooperative. No distress.   Cardiovascular: Normal rate, regular rhythm, S1 normal and S2 normal.    Pulmonary/Chest: Effort normal.   Neurological: She is alert and  oriented to person, place, and time.     ASSESSMENT/PLAN  (626.2) Excessive or frequent menstruation  (primary encounter diagnosis)  (V25.42) Mirena IUD  Comment: Reviewed lab results and OB recommendations of IUD removal. Patient will transfer care to [facility] and follow up with them  Plan:   -follow up with [facility] for IUD removal        1. The patient indicates understanding of these issues and agrees with the plan.  2.  The patient is given an After Visit Summary sheet that lists all of their medications with directions, their allergies, orders placed during this encounter, immunization dates, and follow- up instructions.  3. I reviewed the patient's medical information and medical history   4.  I reconciled the patient's medication list and prepared and supplied needed refills.  5.  I have reviewed the past medical, family, and social history sections including the medications and allergies listed in the above medical record  Saide Lanuza PA-C, 08/16/2013, 4:55 PM

## 2013-09-01 ENCOUNTER — Telehealth (HOSPITAL_BASED_OUTPATIENT_CLINIC_OR_DEPARTMENT_OTHER): Payer: Self-pay | Admitting: Emergency Medicine

## 2013-09-01 ENCOUNTER — Ambulatory Visit (HOSPITAL_BASED_OUTPATIENT_CLINIC_OR_DEPARTMENT_OTHER): Payer: PRIVATE HEALTH INSURANCE | Admitting: Emergency Medicine

## 2013-09-01 ENCOUNTER — Encounter (HOSPITAL_BASED_OUTPATIENT_CLINIC_OR_DEPARTMENT_OTHER): Payer: Self-pay | Admitting: Emergency Medicine

## 2013-09-01 VITALS — BP 104/76 | HR 79 | Temp 97.5°F | Ht 62.68 in | Wt 208.5 lb

## 2013-09-01 DIAGNOSIS — N920 Excessive and frequent menstruation with regular cycle: Secondary | ICD-10-CM

## 2013-09-01 DIAGNOSIS — Z0000 Encounter for general adult medical examination without abnormal findings: Secondary | ICD-10-CM

## 2013-09-01 DIAGNOSIS — E559 Vitamin D deficiency, unspecified: Secondary | ICD-10-CM

## 2013-09-01 DIAGNOSIS — E669 Obesity, unspecified: Secondary | ICD-10-CM

## 2013-09-01 DIAGNOSIS — H04203 Unspecified epiphora, bilateral lacrimal glands: Secondary | ICD-10-CM

## 2013-09-01 MED ORDER — VITAMIN D (ERGOCALCIFEROL) 1.25 MG (50000 UT) PO CAPS
50000.0000 [IU] | ORAL_CAPSULE | ORAL | Status: DC
Start: 2013-09-01 — End: 2014-11-09

## 2013-09-01 NOTE — Progress Notes (Signed)
Bonnie Tucker is a 47 year old female  Transfer from [place].    Heavy periods and Mirena is past due for changing.  Has appt with Dr Raylene Miyamoto for Sept 24 2014 - referral done.    Concerned about weight - started last week - to eat better -   No bread this weekend - first time - was hard -   Given handouts in [place] and Spanish for managing your weight.  Has eliminated 3 of her 4 previous coffee - because she likes coffee very sweet.  Long range goal is 90 lbs.  Encouraged to aim for one pound a week.  Daughter is using Weight Watcher's system.    VITAMIN D,25 HYDROXY (ng/ml)   Date  Value    01/29/2012  16.4*   ----------  Has not been supplemented for vitamin D.    Reviewed cholesterol levels - low HDL at 28 - discussed importance of exercise to raise HDL.      Past Medical History    MIGRAINE NOS 07/24/2006    Comment: Complex migraine - off meds    OVARIAN CYST NEC/NOS 07/24/2006    Comment: Recurrent small simple ovarian cyst - functional    OBSTRUCTIVE SLEEP APNEA, ADULT & PED 07/24/2006    Comment: Severe obstructive sleep apnea - on CPAP    Sleep apnea     Comment: no longer uses CPAP - no longer has sxs of sleep apnea    GERD (gastroesophageal reflux disease)     Functional ovarian cysts     Comment: small, seen on u/s    Wears eyeglasses     Comment: reading glasses         Past Surgical History    NO SIGNIFICANT SURGICAL HISTORY         Family History    Hypertension Mother     OTHER Father     Comment: killed by terrorists [place]    In Good Health Brother     In Good Health Brother     Cancer - Breast FamHxNeg     Asthma Sister     Alcohol/Drug Abuse FamHxNeg     Cancer - Other FamHxNeg     Diabetes FamHxNeg     Heart FamHxNeg     Lipids FamHxNeg     Mental/Emotional Disorders FamHxNeg     Stroke FamHxNeg     Pulmonary FamHxNeg     Renal FamHxNeg     TB FamHxNeg     Thyroid FamHxNeg     In Good Health Mother      Social History    Marital Status: Married             Spouse Name:                        Years of Education:                 Number of children:               Social History Main Topics    Smoking Status: Never Smoker                      Smokeless Status: Never Used                        Alcohol Use: No  Drug Use: No              Sexual Activity: Yes               Partners with: Female       Birth Control/Protection: IUD       Comment: paraguard 2000    Other Topics            Concern  Blood Transfusions      No  Seat Belt               Yes    Social History Narrative    She is a Engineer, water, 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     Says it's not too stressful.     Married.     Has two adult children.         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    Living with husband.    Children are out of the house.               Review of Systems   Constitutional: Negative for fever and chills.   HENT: Negative for ear pain, sore throat and neck pain.    Eyes:        Watery eyes - intermittent bilateral - would like to be evaluated -      Respiratory: Negative for cough and shortness of breath.    Cardiovascular: Negative for chest pain and palpitations.   Gastrointestinal: Negative for nausea, vomiting, abdominal pain, diarrhea and constipation.   Genitourinary: Negative for dysuria and hematuria.        Heavy periods - IUD in place   Bleeding with sex.  No pain.   Musculoskeletal: Negative for back pain.   Neurological: Negative for seizures and headaches.   Endo/Heme/Allergies: Negative for environmental allergies. Does not bruise/bleed easily.   Psychiatric/Behavioral:        Phq 9 score is zero.     Physical Exam   Vitals reviewed.  Constitutional: She is oriented to person, place, and time. She appears well-developed and well-nourished. No distress.   Estimated body mass index is 37.32 kg/(m^2) as calculated from the following:    Height as of this encounter: 5' 2.68" (1.592 m).    Weight as of this encounter: 208 lb 8 oz (94.575  kg).     HENT:   Head: Normocephalic.   Right Ear: External ear normal.   Left Ear: External ear normal.   Mouth/Throat: Oropharynx is clear and moist. No oropharyngeal exudate.   Eyes: EOM are normal. Pupils are equal, round, and reactive to light.   Neck: Neck supple.   Cardiovascular: Normal rate, regular rhythm and intact distal pulses.    No murmur heard.  Pulmonary/Chest: Effort normal and breath sounds normal.   Breast exam - no breast masses, discharge, tenderness.  Axillae clear.     Abdominal: Soft. Bowel sounds are normal. There is no tenderness. There is no rebound.   Musculoskeletal: She exhibits no edema.   Lymphadenopathy:     She has no cervical adenopathy.   Neurological: She is alert and oriented to person, place, and time.   Skin: Skin is warm and dry.   Psychiatric: She has a normal mood and affect. Her behavior is normal.     ASSESSMENT/PLAN:  (V70.0) Routine medical exam  (primary encounter diagnosis)    (626.2) Excessive or frequent menstruation  Plan: REFERRAL TO  GYNECOLOGY (INT)        Removal of IUD - discuss birth control -     (375.20) Watery eyes  Comment: Dr Arbutus Ped  Plan: REFERRAL TO OPHTHALMOLOGY (EXT)          (268.9) Vitamin D deficiency  Comment: will supplement with vitamin D weekly x 12 weeks.  Plan: vit D    (278.00) Obesity (BMI 35.0-39.9 without comorbidity)  Comment: discussed weight loss strategies  Plan: given handouts - goal one pound per week, increase exercise.

## 2013-09-01 NOTE — Patient Instructions (Addendum)
To improve your health:  Reduce/eliminate white sugar, white flour, white rice and white potato.  Replace with brown rice; whole wheat pasta, and other whole grains.    Your vitamin D level is low.  I have sent a prescription for vitamin D 50,000 units per WEEK for 12 weeks to your pharmacy.  Once you finish the prescription, please continue to take vitamin D 1000 units per day.  No prescription is necessary for this vitamin.    Please call your insurance company and let them know that DR Helayne Seminole is your new PCP.    Please ask Dr Raylene Miyamoto about birth control options -   And about bleeding with sex with your husband.

## 2013-09-01 NOTE — Progress Notes (Signed)
.  lvm for pt to call dr greenfield to schedule opthalmology appiontment

## 2013-09-22 ENCOUNTER — Encounter (HOSPITAL_BASED_OUTPATIENT_CLINIC_OR_DEPARTMENT_OTHER): Payer: Self-pay | Admitting: Obstetrics & Gynecology

## 2013-09-22 ENCOUNTER — Ambulatory Visit (HOSPITAL_BASED_OUTPATIENT_CLINIC_OR_DEPARTMENT_OTHER): Payer: PRIVATE HEALTH INSURANCE | Admitting: Obstetrics & Gynecology

## 2013-09-22 VITALS — BP 120/72 | HR 70 | Wt 203.0 lb

## 2013-09-22 DIAGNOSIS — Z30433 Encounter for removal and reinsertion of intrauterine contraceptive device: Secondary | ICD-10-CM

## 2013-09-22 LAB — URINE PREGNANCY TEST (POINT OF CARE): HCG QUALITATIVE URINE: NEGATIVE

## 2013-09-22 MED ORDER — LEVONORGESTREL 20 MCG/24HR IU IUD
1.00 | INTRAUTERINE_SYSTEM | INTRAUTERINE | Status: AC
Start: 2013-09-22 — End: 2018-09-21

## 2013-09-22 MED ORDER — IBUPROFEN 400 MG PO TABS
800.00 mg | ORAL_TABLET | Freq: Once | ORAL | Status: AC
Start: 2013-09-22 — End: ?

## 2013-09-22 NOTE — Progress Notes (Signed)
S/  Wants a new Mirena IUD because the one she has now has been in for about 5.5 years.  PATIENT/PROCEDURE VERIFICATION DOCUMENTATION    Correct patient: Yes  Correct procedure: Yes  Correct side, site, mark visible if applicable: N/A  Correct position: Yes  Special equipment/implant(s) present, if applicable: Yes    Time-out completed, documented by provider doing procedure or designated team member:  Dedra Skeens    09/22/2013    40:32 PM  47 year old female is here for an IUD insertion. Reason(s) for IUD: contraception. Currently she is sexually active, monogamous. She denies current symptoms of possible pelvic infection. Patient's last menstrual period was 09/08/2013. Labs completed include: negative pregnancy test.    Obstetric History   G3  P3  T0  P0  A0  TAB0  SAB0  E0  M0  L0     Patient has verbalized understanding of risks and benefits and has signed the consent form. Current statistical risk of pregnancy with IUD is 0.3-1%. Current statistical risk of ectopic pregnancy with IUD is low but if pregnancy occurs ectopic risk is about 35%.    EXAM:  Uterus: anteverted, normal size and non-tender.    PROCEDURE:  IUD type: Mirena. Cervix prepped with betadine. Tenaculum applied to anterior lip of the cervix. Uterus Sounds to 8cm. IUD inserted easily. Strings trimmed.    POST PAIN ASSESSMENT:  Post pain assessment done. Patient rates pain as a 8 on a 0-10 pain scale. Estimated blood loss: 1cc.    DISPOSITION AND FOLLOW UP:  Instructions given to patient regarding checking IUD strings, anticipated vaginal bleeding and warning signs. Instructed to call for any concerns. Follow up appointment in 6 weeks.

## 2013-09-22 NOTE — Patient Instructions (Signed)
Hoja informativa  La T de cobre o el dispositivo intrauterino (DIU) ParaGard    Qu es el DIU?  La T de cobre o el DIU ParaGard es un mtodo anticonceptivo que se pone dentro del tero. El DIU tiene la forma de la letra "T" y es de plstico o de cobre (un tipo de metal). Tiene un pequeo hilo que se sale del tero, pero no de la vagina, por lo que no se ve.       Cmo funciona el DIU?  El DIU funciona evitando que el ovario libere el huevo y provocando un cambio en el tero que no permite que el espermatozoide alcance el huevo.     Cun efectivo es el DIU para prevenir el embarazo?  El DIU funciona muy bien para prevenir el embarazo. Si 100 mujeres usan el DIU por ao solamente una mujer pudiera quedar embarazada  Cosas para pensar:  - Es probable que usted no quede embarazada con este mtodo anticonceptivo.  - Puede pudiera quedar embarazada pronto despus de retirar el  DIU.  - Nadie necesita saber que usted est usando el DIU si usted no quiere que sepan.  - No necesita interrumpir las relaciones sexuales para usar el DIU  - El DIU puede ser usado por mujeres que no pueden o no quieren usar anticonceptivos que contienen hormonas, tales como la pldora, el parche, el anillo, la inyeccin o el SIU [place].  - El DIU dura hasta 10 aos, pero  un profesional de la salud se lo podra quitar en cualquier momento.   - Las mujeres que amamantan pueden usar el DIU  - El DIU NO la protege contra el HIV y otras enfermedades de transmisin sexual (ETS)  - El DIU puede provocar cambios en sus periodos.  - Lleva unos meses para que el cuerpo se acostumbre al DIU  - El DIU debe ser colocado en el tero y retirado por un profesional de la salud    Cules son los posibles efectos adversos al usar el DIU?  - Clicos, sensacin de mareos o desmayo cuando se coloca el DIU en el tero  - Cambios en los periodos como goteo de sangre, sangrado ms abundante o ms largo y con clicos  - Durante las relaciones sexuales su compaero  podra sentir el hilo  - Mayor flujo vaginal  - Anemia si pierde mucha sangre durante el periodo   - Dolor de espalda  - Ronchas     A veces el DIU puede causar serios problemas de salud  - El DIU puede atravesar las paredes del tero  - .  - El embarazo pudiera anidarse fuera del tero cuando se usa el DIU  - El DIU puede salirse del tero    Vaya lo antes posible al centro de salud si ocurre un de los siguientes:  - Dolor abdominal bajo  - Dolor durante las relaciones sexuales  - Fiebre inexplicada  - Sangrado vaginal intenso o que dura ms de lo esperado  - Flujo vaginal que no es normal para usted.  - Falta de una menstruacin o si cree que puede estar embarazada  - Si no siente el hilo del DIU o si el hilo  parece mucho ms largo  - Si siente el plstico duro del DIU    Contacte su mdico o enfermera si piensa que tiene una ETS o si quiere dejar de usar el DIU.   Para disminuir el riesgo de transmisin del HIV y de   otras ETS use condones de ltex cada vez que tenga relaciones sexuales por la vagina, el ano o la boca.   Si tiene relaciones sexuales y no usa anticonceptivos, o el condn que est usando se rompe o se sale, puede usar anticoncepcin de emergencia (AE). La AE previene el embarazo si la toma lo antes posible. Si necesita AE llame a su mdico o enfermera, al consejero de planificacin familiar o a la farmacia.

## 2013-10-27 ENCOUNTER — Ambulatory Visit (HOSPITAL_BASED_OUTPATIENT_CLINIC_OR_DEPARTMENT_OTHER): Payer: PRIVATE HEALTH INSURANCE | Admitting: Physician Assistant

## 2013-10-27 ENCOUNTER — Encounter (HOSPITAL_BASED_OUTPATIENT_CLINIC_OR_DEPARTMENT_OTHER): Payer: Self-pay | Admitting: Emergency Medicine

## 2013-10-27 ENCOUNTER — Encounter (HOSPITAL_BASED_OUTPATIENT_CLINIC_OR_DEPARTMENT_OTHER): Payer: Self-pay | Admitting: Physician Assistant

## 2013-10-27 ENCOUNTER — Emergency Department (HOSPITAL_BASED_OUTPATIENT_CLINIC_OR_DEPARTMENT_OTHER)
Admission: RE | Admit: 2013-10-27 | Disposition: A | Discharge status: Home / Self Care | Payer: Self-pay | Source: Emergency Department | Attending: Physician Assistant | Admitting: Physician Assistant

## 2013-10-27 VITALS — BP 110/80 | HR 77 | Temp 98.3°F | Wt 209.0 lb

## 2013-10-27 DIAGNOSIS — S060X1A Concussion with loss of consciousness of 30 minutes or less, initial encounter: Secondary | ICD-10-CM

## 2013-10-27 DIAGNOSIS — M542 Cervicalgia: Secondary | ICD-10-CM

## 2013-10-27 LAB — CT HEAD WO CONTRAST

## 2013-10-27 MED ORDER — BUTALBITAL-APAP-CAFFEINE 50-325-40 MG PO TABS
1.0000 | ORAL_TABLET | Freq: Once | ORAL | Status: AC
  Administered 2013-10-27: 1 via ORAL
  Filled 2013-10-27: qty 1

## 2013-10-27 MED ORDER — METOCLOPRAMIDE HCL 5 MG/ML IJ SOLN
10.00 mg | Freq: Once | INTRAMUSCULAR | Status: AC
Start: 2013-10-27 — End: 2013-10-27
  Administered 2013-10-27: 10 mg via INTRAVENOUS
  Filled 2013-10-27: qty 2

## 2013-10-27 MED ORDER — DIPHENHYDRAMINE HCL 50 MG/ML IJ SOLN
25.00 mg | Freq: Once | INTRAMUSCULAR | Status: AC
Start: 2013-10-27 — End: 2013-10-27
  Administered 2013-10-27: 25 mg via INTRAVENOUS
  Filled 2013-10-27: qty 1

## 2013-10-27 MED ORDER — KETOROLAC TROMETHAMINE 30 MG/ML IJ SOLN
30.00 mg | Freq: Once | INTRAMUSCULAR | Status: AC
Start: 2013-10-27 — End: 2013-10-27
  Administered 2013-10-27: 30 mg via INTRAVENOUS
  Filled 2013-10-27: qty 1

## 2013-10-27 MED ORDER — SODIUM CHLORIDE 0.9 % IV BOLUS
1000.00 mL | Freq: Once | INTRAVENOUS | Status: AC
Start: 2013-10-27 — End: 2013-10-27
  Administered 2013-10-27: 1000 mL via INTRAVENOUS

## 2013-10-27 NOTE — ED Triage Note (Signed)
C/o left sided headache, dizzyness, n/v, blurred vision since 6pm yesterday. States grandson jumped from couch to floor and his knees struck the left side of her head. "I laid on the floor because everything was black and I was dizzy". Seen at ambulatory clinic this morning and referred to ER.
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
## 2013-10-27 NOTE — ED Notes (Signed)
Discharge instructions reviewed with patient. Verbalized understanding. Discharged home. Patient instructed not to drink alcohol or drive after taking narcotic in ER. Discharged home with a ride.

## 2013-10-27 NOTE — ED Notes (Signed)
IV started, meds administered.

## 2013-10-27 NOTE — Patient Instructions (Signed)
Contusin y lesin cerebral  (Concussion and Brain Injury)   Un golpe o una sacudida en la cabeza puede afectar el normal funcionamiento del cerebro. Este tipo de lesin se denomina "conmocin cerebral" o "traumatismo craneal cerrado" Este tipo de contusin no pone en peligro su vida. An as, los efectos de una contusin pueden ser graves.   CAUSAS  Una concusin se produce por un traumatismo en la cabeza. El golpe puede ser directo o indirecto como se describe a continuacin.   Golpe directo (chocar con otro jugador en un partido de ftbol, un golpe en una pelea, golpearse contra una superficie dura).   Un golpe indirecto (la cabeza se Walt Disney rpida y violentamente de adelante hacia atrs, como en un choque automovilstico).  SNTOMAS  El cerebro es muy complejo. Cada lesin de la cabeza es diferente. Algunos sntomas pueden aparecer de inmediato. Otros sntomas pueden no aparecer [employer] o semanas despus de la contusin. Los signos pueden ser difciles de Chief Strategy Officer. En un primer momento, los pacientes, familiares y profesionales tal vez los adviertan. Puede tener un aspecto saludable y Research scientist (life sciences) o sentir de modo diferente.   Los sntomas son temporarios pero generalmente duran Time [employer], semanas o ms. Los sntomas son:   Cefaleas leves que no se alivian.   Presentar ms dificultad que lo habitual para:   Recordar cosas.   Prestar atencin o concentrarse.   Organizar las tareas diarias.   Tomar decisiones y [employer].   Lentitud para pensar, actuar, hablar o leer.   Sentirse perdido o confuso.   Sentirse cansado [employer], falta de Engineer, drilling (fatiga).   Sentirse somnoliento.   Problemas para dormir.   Dormir ms tiempo que el habitual.   Duerme ms que lo habitual.   Problemas para conciliar el sueo.   Problemas para dormir (insomnio).   Prdida del equilibrio, mareos.   Nuseas o vmitos.   Adormecimiento u hormigueo.   Mayor sensibilidad para:   Los ruidos.   La  luz.   Las distracciones.  Entre otros sntomas se incluyen:   Problemas de la visin o cansancio en los ojos.   Prdida del sentido del gusto o Cabin crew.   Ruidos en el odo.   Cambios en el humor como sentirse triste, ansioso o indiferente.   Irritacin, enojo por cosas pequeas o sin motivos.   Falta de motivacin.  DIAGNSTICO  El mdico diagnosticar conmocin cerebral o lesin cerebral leve basndose en la descripcin del traumatismo y los sntomas.   La evaluacin puede incluir:   Un escaneo cerebral para buscar seales de lesiones en el cerebro. Incluso si en el examen no se observan lesiones, podra tener una contusin.   Anlisis de sangre para asegurarse de que no existe otro problema.  TRATAMIENTO   Las personas que han sufrido una conmocin cerebral deben ser examinadas y [place]. La mayor parte de las personas que sufren conmocin cerebral son tratados en el servicio de emergencias o en el consultorio mdico. Training and development officer en el hospital por una noche para recibir un tratamiento ms intensivo.   El profesional le dar el alta con algunas instrucciones que deber seguir. Asegrese de seguirlas cuidadosamente.   Comunquele al profesional si toma medicamentos (prescriptos, de venta libre o "naturales") o si bebe alcohol o consume drogas ilegales. Tambin informe al profesional si toma anticoagulantes o aspirina. Estos medicamentos pueden aumentar la probabilidad de que existan complicaciones. Esta es informacin importante que podra Licensed conveyancer.   Melina [employer]  los medicamentos de [address] o de prescripcin para Chief Technology Officer, Environmental health practitioner o la fiebre, segn se lo indique el profesional que lo asiste.  PRONSTICO  El tiempo que lleve la recuperacin depende de cada persona. Aunque la Harley[employer]Davidson de las personas se recupera satisfactoriamente, la mejora depende de varios factores. Entre ellos se incluyen la gravedad de la contusin, la zona del cerebro lesionada, la  edad y [place] de salud previo a la lesin.   Como todas las lesiones de la cabeza son [place], tambin lo es la recuperacin. La Harley-Davidson de las personas con lesiones leves se recupera por completo. La recuperacin puede demorar algn tiempo. En general, la recuperacin es ms lenta en las [employer]. Adems, a aquellas personas que ya han sufrido una contusin en el pasado, les llevar ms tiempo recuperarse de la lesin actual. La ansiedad y la depresin podrn tambin hacer ms difcil de superar los sntomas de la lesin cerebral.  INSTRUCCIONES PARA EL CUIDADO DOMICILIARIO  El regreso a las actividades habituales debe ser gradual. El cuerpo y el cerebro necesitan su tiempo para recuperarse.   Deben dormir las horas suficientes durante la noche y Systems analyst. El reposo ayuda al cerebro a curarse.   Evite quedarse despierto muy tarde por la noche.   Debe irse a dormir a la VF Corporation de [address] y los fines de [place].   Promueva las siestas durante el da o momentos de descanso cuando parece cansado.   Limite las [employer] Corporation le exijan mucha concentracin (reposo cognitivo o cerebral). Aqu se incluye:   Tareas para el hogar o trabajos relacionados con el empleo.   Mirar televisin.   Trabajos en la computadora.   Deber evitar las actividades que puedan favorecer otra lesin, tales como los deportes de contacto o Teacher, adult education, a International aid/development worker profesional le indique que puede Journalist, newspaper. An despus de la Psychologist, educational, debe protegerse de una nueva lesin.   Pregunte cuando podr volver a conducir un automvil, una bicicleta u manipular equipos riesgosos. La capacidad para reaccionar puede ser ms lenta luego de una lesin cerebral.   Converse con el profesional acerca del mejor momento para que retome la actividad escolar o [place].   Informe a los [employer], al departamento de enfermera de la escuela, al consejero escolar, Conservation officer, historic buildings acerca de  los sntomas y Engineer, structural que tiene. Ellos deben ser instruidos para informar:   Aumento en los problemas de atencin o Librarian, academic.   Aumento en los problemas de memoria o en el aprendizaje de informacin nueva.   Necesita ms tiempo para completar tareas o encargos.   Aumento de la irritabilidad o disminucin de la capacidad para Social worker.   Aparecen nuevos sntomas.   Tome slo aquellos medicamentos que le han autorizado.   No beba alcohol hasta que el profesional le indique que ya no corre Surveyor, minerals. El alcohol y ciertas drogas podrn Surveyor, minerals recuperacin y ponerlo en riesgo de otra lesin.   Si le resulta ms difcil que lo habitual recordar las cosas, escrbalas.   Si se distrae fcilmente, trate de hacer una cosa a la vez. Por ejemplo, trate de no mirar televisin mientras planea la cena.   Consulte con familiares y amigos si debe tomar decisiones importantes.   Cumpla con las visitas de control tal como se le indic. Se recomienda realizar varias evaluaciones de los sntomas para favorecer su recuperacin.  PREVENCIN  Proteja su cabeza de traumatismos  futuros. Es muy importante que evite otro golpe en la cabeza antes de la recuperacin. En casos raros, un nuevo traumatismo ha ocasionado daos cerebrales permanentes, hinchazn del cerebro y [employer]. Evite los traumatismos usando:    Cinturones de seguridad al viajar en automvil.   Beba alcohol con moderacin.   Utilice cascos al andar en bicicleta, esquiar, patinar en tabla, usar patines o al Union Pacific Corporation similares.   Medidas de seguridad en el hogar.   Para evitar tropezar no tenga desorden en pisos y escaleras.   Coloque barras en los baos y pasamanos en las escaleras.   Ponga alfombras antideslizantes en pisos y baeras.   Mejore la iluminacin en zonas de penumbra.  SOLICITE ATENCIN MDICA SI:  Una lesin en la cabeza puede ocasionar daos permanentes. Deber consultar con el mdico si ha  tenido algunos de lo siguientes sntomas por ms de 3 semanas despus de la lesin o planea volver a hacer deportes:   Dolor de cabeza crnico.   Mareos o problemas para mantener el equilibrio.   Nuseas.   Problemas de la visin.   Mayor sensibilidad a ruidos o luces.   Depresin o cambios de humor.   Ansiedad o irritabilidad.   Problemas de memoria.   Dificultad para concentrase o prestar atencin.   Problemas para dormir.   Sentirse cansado [employer] Corporation.  SOLICITE ATENCIN MDICA DE INMEDIATO SI:  Ha sufrido un golpe o sacudida en la cabeza y usted (o sus familiares o amigos) notan:   Cefalea que empeora.   Debilitamiento (incluso de [place], pierna o una parte de la cara), adormecimiento o falta de coordinacin.   Vmitos a repeticin.   Mucho sueo o desmayos.   Uno de los centros negros del ojo (pupila) es ms grande que el otro.   Presenta convulsiones (temblores).   Habla arrastrando las palabras.   Aumento de la confusin, la agitacin o la irritabilidad.   Imposibilidad de Nutritional therapist o lugares.   Dolor en el cuello.   Dificultad para despertarse.   Cambios no habituales en la conducta.   Prdida de la conciencia  Los adultos [employer] con lesin cerebral poseen un mayor riesgo de Warehouse manager complicaciones graves como cogulos en el cerebro. Los dolores de cabeza que empeoran o un aumento en la confusin son sntomas de esta complicacin. Si aparecen estos sntomas vea a un mdico de inmediato.  EST SEGURO QUE:    Comprende las instrucciones para el alta mdica.   Controlar su enfermedad.   Solicitar atencin mdica de inmediato segn las indicaciones.  PARA OBTENER MS INFORMACIN  Hay grupos de ayuda para las personas que han sufrido una lesin cerebral y su familia. Ellos proporcionan informacin y ayudan a la gente a ponerse en contacto con las fuentes de [place] locales. Estos incluyen grupos de apoyo, servicios de rehabilitacin y Neomia Dear gran variedad de profesionales del  cuidado de Radiographer, therapeutic. [employer], la Brain Injury Association (BIA www.biausa.org) cuenta con oficinas en todo el pas que renen informacin cientfica y [place] y trabaja a nivel nacional para ayudar a las personas que han sufrido una lesin cerebral.   Document Released: 11/28/2008 Document Revised: 03/09/2012  ExitCare Patient Information 2014 [place], [place].  Sndrome posconmocin cerebral   (Post-Concussion Syndrome)   El sndrome posconmocin cerebral significa que usted tiene sntomas despus de sufrir una lesin en la cabeza. Algunos sntomas pueden durar [employer] o meses. Generalmente desaparecen por s mismos con Museum/gallery conservator.  CUIDADOS  EN EL HOGAR    Tome slo los medicamentos que le haya indicado el mdico. No tome aspirina.   Duerma con la cabeza ligeramente levantada (elevada) para [employer].   Evite las actividades que puedan causarle otra lesin cerebral. No practique ftbol, hockey, artes marciales ni equitacin hasta que el mdico lo autorice.   Cumpla con los controles mdicos segn las indicaciones.  SOLICITE AYUDA DE INMEDIATO SI:    Se siente confundido o somnoliento.   No puede despertar a Optician, dispensing.   Siente Programme researcher, broadcasting/film/video (nuseas) y vmitos.   Siente que se est moviendo estando quieto (vrtigo).   Nota que los ojos de la persona lesionada se mueven muy rpido.   Comienza a sacudirse (tiene convulsiones) o se desvanece (se desmaya).   Le duele la cabeza y no mejora con medicamentos.   No puede mover los brazos o las piernas normalmente.   La parte central negra de sus ojos (pupila) cambia de tamao.   Presenta una secrecin clara o con sangre que proviene de la nariz o de los odos.   Los sntomas Building services engineer de Scientist, clinical (histocompatibility and immunogenetics).  ASEGRESE DE QUE:    Comprende estas instrucciones.   Controlar su enfermedad.   Solicitar ayuda de inmediato si no mejora o si empeora.  Document Released: 12/16/2005 Document Revised:  03/09/2012  [facility] Patient Information 2014 [place], [place].

## 2013-10-28 ENCOUNTER — Telehealth (HOSPITAL_BASED_OUTPATIENT_CLINIC_OR_DEPARTMENT_OTHER): Payer: Self-pay

## 2013-10-28 NOTE — Progress Notes (Signed)
Called patient for ED f/u, she reports she is feeling a little better, she has a f/u with Juanita on Monday,advised to call back with any issues/concerns in the meantime, she agreed.

## 2013-11-01 ENCOUNTER — Encounter (HOSPITAL_BASED_OUTPATIENT_CLINIC_OR_DEPARTMENT_OTHER): Payer: Self-pay | Admitting: Physician Assistant

## 2013-11-01 ENCOUNTER — Ambulatory Visit (HOSPITAL_BASED_OUTPATIENT_CLINIC_OR_DEPARTMENT_OTHER): Payer: PRIVATE HEALTH INSURANCE | Admitting: Physician Assistant

## 2013-11-01 VITALS — BP 127/83 | HR 87 | Temp 97.4°F | Wt 205.0 lb

## 2013-11-01 DIAGNOSIS — S060X9A Concussion with loss of consciousness of unspecified duration, initial encounter: Secondary | ICD-10-CM

## 2013-11-01 DIAGNOSIS — R42 Dizziness and giddiness: Secondary | ICD-10-CM

## 2013-11-01 DIAGNOSIS — G43909 Migraine, unspecified, not intractable, without status migrainosus: Secondary | ICD-10-CM

## 2013-11-01 DIAGNOSIS — R110 Nausea: Secondary | ICD-10-CM

## 2013-11-01 DIAGNOSIS — S060XAA Concussion with loss of consciousness status unknown, initial encounter: Secondary | ICD-10-CM

## 2013-11-01 MED ORDER — IBUPROFEN 600 MG PO TABS
600.00 mg | ORAL_TABLET | Freq: Four times a day (QID) | ORAL | Status: AC | PRN
Start: 2013-11-01 — End: 2013-12-01

## 2013-11-01 MED ORDER — MECLIZINE HCL 50 MG PO TABS
50.00 mg | ORAL_TABLET | Freq: Two times a day (BID) | ORAL | Status: AC | PRN
Start: 2013-11-01 — End: 2013-12-01
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
## 2013-11-05 ENCOUNTER — Ambulatory Visit (HOSPITAL_BASED_OUTPATIENT_CLINIC_OR_DEPARTMENT_OTHER): Payer: PRIVATE HEALTH INSURANCE | Admitting: Physician Assistant

## 2013-11-05 ENCOUNTER — Encounter (HOSPITAL_BASED_OUTPATIENT_CLINIC_OR_DEPARTMENT_OTHER): Payer: Self-pay | Admitting: Physician Assistant

## 2013-11-05 VITALS — BP 112/77 | HR 85 | Temp 98.5°F | Wt 207.2 lb

## 2013-11-05 DIAGNOSIS — F0781 Postconcussional syndrome: Secondary | ICD-10-CM

## 2013-11-05 MED ORDER — AMITRIPTYLINE HCL 25 MG PO TABS
25.0000 mg | ORAL_TABLET | Freq: Every evening | ORAL | Status: DC
Start: 2013-11-05 — End: 2014-11-09
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
## 2013-11-05 NOTE — Patient Instructions (Signed)
Sndrome postconmocional  (Post-Concussion Syndrome)   El sndrome postconmocional describe los sntomas que pueden ocurrir despus de una lesin en la cabeza. Estos sntomas pueden durar desde varias semanas hasta meses.   CAUSAS   No se sabe porqu algunas lesiones pueden causar este sndrome. Puede ocurrir tanto si la lesin fue leve o grave, y si ha usado proteccin o no.   SNTOMAS   Dificultades con la memoria.   Mareos.   Dolor de [place].   Visin doble o borrosa.   Sensibilidad a Statistician.   Dificultades ZOXWRUEAV.   Depresin.   Cansancio.   Debilidad.   Dificultad para concentrarse.   Dificultad para dormirse o para continuar el sueo.   Vmitos.  DIAGNSTICO   No hay una prueba para determinar si sufre este sndrome. El mdico podr indicar un estudio por imgenes del cerebro, como una tomografa computada, para descartar la presencia de otros sntomas (como una lesin grave dentro del crneo).   TRATAMIENTO   Generalmente, estos problemas desaparecen sin atencin mdica. El mdico podr prescribir algunos medicamentos para disminuir los sntomas. Es importante hacer controles con un neurlogo para Systems analyst recuperacin y Location manager los sntomas o problemas persistentes.   INSTRUCCIONES PARA EL CUIDADO DOMICILIARIO   Utilice los medicamentos de venta libre o de prescripcin para Chief Technology Officer, el malestar o la [place], segn se lo indique el profesional que lo asiste. No tome aspirina. La aspirina puede disminuir el tiempo de [place].   El dormir con la cabeza ligeramente levantada lo ayudar a Paramedic las Start.   Evite toda situacin en la que se pudiera producir otra lesin cerebral (ftbol, hockey, artes marciales, andar a caballo). El problema empeorar cada vez que experimente un sndrome postconmocional. Debe evitar estas actividades hasta que sea evaluado por el mdico correspondiente.   Cumpla con todas las visitas de control, segn le indique su mdico.  SOLICITE ATENCIN MDICA  DE INMEDIATO SI:   Presenta confusin o somnolencia.   Imposibilidad de despertar a la persona lesionada.   Presenta nuseas (ganas de vomitar), o vmitos persistentes y compulsivos (vmitos en proyectil).   Siente que se est moviendo estando quieto (vrtigo).   Nota que los ojos de la persona lesionada se mueven muy rpido. Esto puede ser un signo de vrtigo.   Presenta convulsiones o prdida del conocimiento.   Presenta cefalea intensa y persistente que no se alivia con medicamentos.   No puede mover los brazos o las piernas [place].   Presenta cambios en el tamao de las pupilas.   Presenta secrecin clara o con sangre en la nariz o en los odos.   Los problemas empeoran en vez de Scientist, clinical (histocompatibility and immunogenetics).  ASEGRESE DE QUE:    Comprende estas instrucciones.   Controlar su enfermedad.   Solicitar ayuda de inmediato si no mejora o si empeora.  Document Released: 09/25/2005 Document Revised: 03/09/2012  [facility] Patient Information 2014 [place], [place].

## 2013-11-08 ENCOUNTER — Telehealth (HOSPITAL_BASED_OUTPATIENT_CLINIC_OR_DEPARTMENT_OTHER): Payer: Self-pay

## 2013-11-08 NOTE — Progress Notes (Signed)
Called pt to let her know her paperwork is ready for pick up.   Per Dorann Lodge I also had to push her f/u up back from 11/14 to 11/21 at the same time 12:00pm.  (copies made.)

## 2013-11-12 ENCOUNTER — Ambulatory Visit (HOSPITAL_BASED_OUTPATIENT_CLINIC_OR_DEPARTMENT_OTHER): Payer: PRIVATE HEALTH INSURANCE | Admitting: Obstetrics & Gynecology

## 2013-11-12 ENCOUNTER — Encounter (HOSPITAL_BASED_OUTPATIENT_CLINIC_OR_DEPARTMENT_OTHER): Payer: Self-pay | Admitting: Obstetrics & Gynecology

## 2013-11-12 VITALS — BP 120/83 | HR 76 | Wt 208.0 lb

## 2013-11-12 DIAGNOSIS — N920 Excessive and frequent menstruation with regular cycle: Secondary | ICD-10-CM

## 2013-11-12 DIAGNOSIS — Z30431 Encounter for routine checking of intrauterine contraceptive device: Secondary | ICD-10-CM

## 2013-11-12 NOTE — Progress Notes (Signed)
S/  Here for an IUD check.  Asked if it is normal to have just a little spotting with menses.    O/  VS reviewed  BMI = 37    External genitalia normal.  BUS neg.  Vagina and cervix appear normal.  IUD string visualized.    A/  47 Y/O with no significant underlying health problems.  H/O menorrhagia in the past.  Has been doing well with Mirena.  Mirena replaced recently after 5+years with prior Mirena.  String visualized today.    P/  Pt reassured that decreased menstruation is expected with a Mirena IUD.

## 2013-11-19 ENCOUNTER — Encounter (HOSPITAL_BASED_OUTPATIENT_CLINIC_OR_DEPARTMENT_OTHER): Payer: Self-pay | Admitting: Physician Assistant

## 2013-11-19 ENCOUNTER — Ambulatory Visit (HOSPITAL_BASED_OUTPATIENT_CLINIC_OR_DEPARTMENT_OTHER): Payer: PRIVATE HEALTH INSURANCE | Admitting: Physician Assistant

## 2013-11-19 VITALS — BP 96/73 | HR 85 | Temp 98.0°F | Wt 210.0 lb

## 2013-11-19 DIAGNOSIS — G44309 Post-traumatic headache, unspecified, not intractable: Secondary | ICD-10-CM

## 2013-11-19 NOTE — Progress Notes (Signed)
Bonnie Tucker is a 47 year old female  Patient presents with:    Headache    Here for f/u post concussion syndrome  She would like to return to work full time w/out restrictions   I previously took her out of work for another week due to ongoing sxs 2/2 head injury on 10/28  She is feeling much better today  She is still having HAs but only in the morning when she wakes up  She denies HA during the day  HA is Intermittent   Located in same L side of her head injury   Dull achy; Lasts 45 min   Motrin 800 mg BID prn resolves pain   She is taking Elavil at night with HA relief   She sleeps well with Elavil   No side effects or adverse reaction   Denies confusion, memory loss, n/v, visual changes, focal weakness, cp, sob, paresthesias, dizziness  CT scan :  Impression:   No acute abnormality.       Review of Systems   Constitutional: Negative for fever.   Eyes: Negative for blurred vision.   Respiratory: Negative for shortness of breath.    Cardiovascular: Negative for chest pain.   Gastrointestinal: Negative for nausea and vomiting.   Musculoskeletal: Negative for myalgias.   Neurological: Positive for headaches. Negative for dizziness.     Physical Exam   Nursing note and vitals reviewed.  Constitutional: She is oriented to person, place, and time. She appears well-developed and well-nourished. No distress.   HENT:   Head: Normocephalic and atraumatic.   Mouth/Throat: Oropharynx is clear and moist. No oropharyngeal exudate.   Eyes: Conjunctivae and EOM are normal. Pupils are equal, round, and reactive to light.   Neck: Normal range of motion. Neck supple.   Cardiovascular: Normal rate, regular rhythm and normal heart sounds.    No murmur heard.  Pulmonary/Chest: Effort normal and breath sounds normal.   Lymphadenopathy:     She has no cervical adenopathy.   Neurological: She is alert and oriented to person, place, and time. She has normal reflexes. No cranial nerve deficit. Coordination normal.   Skin: Skin is  warm and dry. She is not diaphoretic.   Psychiatric: She has a normal mood and affect.     (339.20) Post-concussion headache  (primary encounter diagnosis)  Comment: Continues to have intermittent HAs s/p head injury 10/26/13, however, they have improved and are responding to Ibuprofen and Elavil per pt, neuro exam is negative, previous head CT negative, per pt request, she may return to full duty at work, I do not find a reason for her not to return  Plan: Continue Elavil, Continue Ibuprofen prn, reviewed duration of HAs from concussion can take weeks and sometimes months, letter to return to work given, pt advised to rtc if HA worse or new sxs develop including n/v, confusion, memory loss, weakness    We discussed the patients current medications. The patient expressed understanding and no barriers to adherence were identified.    I have reviewed the past medical, surgical, social and family history and updated these sections of Epic[employer] as relevant. All interim labs, test results, and consult notes were reviewed and discussed with Bonnie Tucker. Medications were reconciled during this visit and a current medication list was given to the patient at the end of the visit.

## 2014-04-19 ENCOUNTER — Ambulatory Visit: Payer: Self-pay | Admitting: Emergency Medicine

## 2014-04-20 ENCOUNTER — Encounter (HOSPITAL_BASED_OUTPATIENT_CLINIC_OR_DEPARTMENT_OTHER): Payer: Self-pay

## 2014-04-20 LAB — MA SCREENING MAMMO BILATERAL WITH CAD

## 2014-04-20 NOTE — Progress Notes (Signed)
Quick Note:    Dear Bonnie PaganiniMaria Ester Donaghy    Thank you for getting your mammogram done!    Your mammogram is normal, and will need to be repeated in one year.    Sincerely,      Helayne SeminoleLaura Carman, MD    ______

## 2014-05-17 ENCOUNTER — Encounter (HOSPITAL_BASED_OUTPATIENT_CLINIC_OR_DEPARTMENT_OTHER): Payer: Self-pay | Admitting: Physician Assistant

## 2014-10-11 ENCOUNTER — Ambulatory Visit (HOSPITAL_BASED_OUTPATIENT_CLINIC_OR_DEPARTMENT_OTHER): Payer: PRIVATE HEALTH INSURANCE | Admitting: Physician Assistant

## 2014-10-11 ENCOUNTER — Encounter (HOSPITAL_BASED_OUTPATIENT_CLINIC_OR_DEPARTMENT_OTHER): Payer: Self-pay | Admitting: Physician Assistant

## 2014-10-11 VITALS — BP 97/74 | HR 86 | Temp 96.6°F | Wt 195.0 lb

## 2014-10-11 DIAGNOSIS — Z23 Encounter for immunization: Secondary | ICD-10-CM

## 2014-10-11 DIAGNOSIS — N939 Abnormal uterine and vaginal bleeding, unspecified: Secondary | ICD-10-CM

## 2014-10-11 LAB — URINE PREGNANCY TEST (POINT OF CARE): HCG QUALITATIVE URINE: NEGATIVE

## 2014-10-11 NOTE — Progress Notes (Signed)
Bonnie PaganiniMaria Ester Tucker is a 48 year old female pt of Dr. Maple HudsonYoung who presents for evaluation of vaginal bleeding      Bleeding  X 1 month  Occurs after sexual intercourse  Occurs every time - lasts for 4-5 days   Bright red blood - sees in toilet and on toilet paper after urination - none after bowel movement  Today starting to have mild abdominal pain LLQ  Has IUD in place  PT denies fever, N/V, dyspareunia, hematuria         BP 97/74 mmHg   Pulse 86   Temp(Src) 96.6 F (35.9 C) (Temporal)   Wt 88.451 kg (195 lb)   SpO2 98%   LMP 09/22/2014 (Approximate)  Pain Score: 0 (0/10)            Physical Exam   Constitutional: She appears well-developed and well-nourished. No distress.   HENT:   Head: Normocephalic and atraumatic.   Mouth/Throat: Oropharynx is clear and moist. No oropharyngeal exudate.   Cardiovascular: Normal rate, regular rhythm and normal heart sounds.    No murmur heard.  Pulmonary/Chest: Effort normal and breath sounds normal. No respiratory distress. She has no wheezes. She has no rales.   Abdominal: Soft. Bowel sounds are normal. She exhibits no distension and no mass. There is tenderness (mild LLQ to deep palpation). There is no rebound and no guarding.   Genitourinary:   Pt declines pelvic exam    Vitals reviewed.      Urine pregnancy: negative      ASSESSMENT/PLAN      (N93.9) Vaginal bleeding  (primary encounter diagnosis)  Comment: Wide differential, needs pelvic exam to further evaluate but declines today, prefers female provider. I think this is reasonable as no indication of anemia on exam, pt appears well, no sign of acute abdomen. Seems less likely rectal bleeding given pt denies blood with bowel movement. Ordered UA and genprobe to evaluate blood from urine or cervicitis, but pt could not produce enough urine for analysis. Will refer to GYN for further evaluation.  Plan: REFERRAL TO GYNECOLOGY (INT), URINE PREGNANCY         TEST (POINT OF CARE)      (Z23) Need for prophylactic vaccination and  inoculation against influenza  Plan: IMMUNIZATION ADMIN SINGLE, RN, PR INFLUENZA         VACCINE QUADRIVALENT 3 YRS PLUS IM (PRIVATE)              We discussed the patients current medications. The patient expressed understanding and no barriers to adherence were identified.   1. The patient indicates understanding of these issues and agrees with the plan. Brief care plan is updated and reviewed with the patient.   2. The patient is given an After Visit Summary sheet that lists all medications with directions, allergies, orders placed during this encounter, and follow-up instructions.   3. I reviewed the patient's medical information and medical history   4. I reconciled the patient's medication list and prepared and supplied needed refills.   5. I have reviewed the past medical, family, and social history sections including the medications and allergies.

## 2014-10-11 NOTE — Progress Notes (Signed)
Influenza Vaccine Procedure  October 11, 2014    1. Has the patient received the information for the influenza vaccine? Yes,     2. Does the patient have any of the following contraindications?  Allergy to eggs? No  Allergic reaction to previous influenza vaccines? No  Any other problems to previous influenza vaccines? No  Paralyzed by Guillain-Barre syndrome?  No  Currently pregnant? No  Current moderate or severe illness? No  Allergy to contact lens solution? No    3. The vaccine has been administered in the usual fashion and the patient/guardian was instructed to wait 20 minutes before leaving the building in the event of an allergic reaction:     Immunization information and current VIS for flu vaccine(s) reviewed; verbal consent given by patient/guardian.

## 2014-10-12 ENCOUNTER — Encounter (HOSPITAL_BASED_OUTPATIENT_CLINIC_OR_DEPARTMENT_OTHER): Payer: Self-pay | Admitting: Internal Medicine

## 2014-10-13 ENCOUNTER — Telehealth (HOSPITAL_BASED_OUTPATIENT_CLINIC_OR_DEPARTMENT_OTHER): Payer: Self-pay

## 2014-10-13 NOTE — Progress Notes (Signed)
Returned call to patient with spanish interpreter, she was inquiring about results of urine pregnancy (POC), advised her it was negative, no other testing done, she reported she thought that but wanted to make sure.

## 2014-10-13 NOTE — Telephone Encounter (Signed)
-----   Message from Caprice KluverSonia Morales sent at 10/13/2014 12:13 PM EDT -----  Regarding: test results  Contact: [phone]      Bonnie Tucker [id number], 48 year old, female, Telephone Information:  Home Phone      [phone]  Work Phone      Not on file.  Mobile          [phone]    CONFIRMED TODAY: Yes    CALL BACK NUMBER:  [phone]    Available times:    Patient's language of care: English    Patient needs a Spanish interpreter.    Patient's PCP: Malachy MoodJanet Young, MD (General)    Person calling on behalf of patient: Patient (self)    Calls today for test result(s).

## 2014-11-09 ENCOUNTER — Encounter (HOSPITAL_BASED_OUTPATIENT_CLINIC_OR_DEPARTMENT_OTHER): Payer: Self-pay | Admitting: Obstetrics & Gynecology

## 2014-11-09 ENCOUNTER — Ambulatory Visit (HOSPITAL_BASED_OUTPATIENT_CLINIC_OR_DEPARTMENT_OTHER): Payer: PRIVATE HEALTH INSURANCE | Admitting: Obstetrics & Gynecology

## 2014-11-09 VITALS — BP 112/78 | HR 73 | Ht 62.0 in | Wt 204.0 lb

## 2014-11-09 DIAGNOSIS — IMO0002 Reserved for concepts with insufficient information to code with codable children: Secondary | ICD-10-CM

## 2014-11-09 DIAGNOSIS — Z30431 Encounter for routine checking of intrauterine contraceptive device: Secondary | ICD-10-CM

## 2014-11-09 DIAGNOSIS — N930 Postcoital and contact bleeding: Secondary | ICD-10-CM

## 2014-11-09 DIAGNOSIS — R102 Pelvic and perineal pain: Secondary | ICD-10-CM

## 2014-11-09 LAB — URINE PREGNANCY TEST (POINT OF CARE): HCG QUALITATIVE URINE: NEGATIVE

## 2014-11-09 NOTE — Progress Notes (Addendum)
S/  Every time she has sex she starts bleeding.  This started over a month ago.  She had a Mirena placed years ago.  It was last replaced last year. She generally gets regular menses with the MIrena in place.  They are light.  She had a span of 5-6 months a few years ago where she had amenorrhea. The bleeding is becoming even lighter more recently.    She has also had a severe pinching pain that lasted for half an hour.  This has happened 4 times in total.  The first time she had the pain was 2-3 weeks.  The pain is sudden in onset then slowly improves spontaneously.  When she messages the area and walks around the pain improves.  The pain is not related to menses. Denies fevers.  Admits to dyspareunia starting one week ago.  Denies vaginal discharge.  Denies GI changes.  Denies dysuria.  She denies hot flashes.      O/  VS reviewed  BMI = 37.3    External genitalia normal.  BUS neg.  Vagina and cervix appear normal.  The IUD string is visible.  Non tender, 10 week size, anteverted uterus.  No adnexal masses or tenderness.  Genprobe done.    Neg co-testing 1/13    A/  Obese 48 Y/O with a history of diverticula o the colon and migraines.    She presents with the new onset of episodic pelvic pain and post coital bleeding.  She has been using Mirena to control menses for many years.  The last one was put in on 09/22/13.  There is no obvious source of the pain and bleeding on exam.    P/  1.  U/S ordered to check placement of IUD.    2.  Check genprobe done today.    F/U to review results.

## 2014-11-10 LAB — CHLAMYDIA GC NAAT
GENPROBE CHLAMYDIA: NEGATIVE
GENPROBE GC: NEGATIVE

## 2014-11-30 ENCOUNTER — Ambulatory Visit: Payer: Self-pay | Admitting: Obstetrics & Gynecology

## 2014-12-16 ENCOUNTER — Ambulatory Visit (HOSPITAL_BASED_OUTPATIENT_CLINIC_OR_DEPARTMENT_OTHER): Payer: PRIVATE HEALTH INSURANCE | Admitting: Obstetrics & Gynecology

## 2014-12-19 ENCOUNTER — Encounter (HOSPITAL_BASED_OUTPATIENT_CLINIC_OR_DEPARTMENT_OTHER): Payer: Self-pay | Admitting: Obstetrics & Gynecology

## 2015-02-14 ENCOUNTER — Encounter (HOSPITAL_BASED_OUTPATIENT_CLINIC_OR_DEPARTMENT_OTHER): Payer: Self-pay | Admitting: Physician Assistant

## 2015-02-14 ENCOUNTER — Ambulatory Visit (HOSPITAL_BASED_OUTPATIENT_CLINIC_OR_DEPARTMENT_OTHER): Payer: PRIVATE HEALTH INSURANCE | Admitting: Physician Assistant

## 2015-02-14 VITALS — BP 120/82 | HR 82 | Temp 97.9°F | Wt 205.6 lb

## 2015-02-14 DIAGNOSIS — R519 Headache, unspecified: Secondary | ICD-10-CM

## 2015-02-14 DIAGNOSIS — Z8669 Personal history of other diseases of the nervous system and sense organs: Secondary | ICD-10-CM

## 2015-02-14 DIAGNOSIS — R51 Headache: Principal | ICD-10-CM

## 2015-02-14 MED ORDER — IBUPROFEN 600 MG PO TABS
600.0000 mg | ORAL_TABLET | Freq: Three times a day (TID) | ORAL | Status: DC | PRN
Start: 2015-02-14 — End: 2015-09-27

## 2015-02-14 MED ORDER — CETIRIZINE HCL 10 MG PO TABS
10.00 mg | ORAL_TABLET | Freq: Every day | ORAL | Status: AC
Start: 2015-02-14 — End: 2015-03-16

## 2015-02-14 MED ORDER — KETOTIFEN FUMARATE 0.025 % OP SOLN
1.00 [drp] | Freq: Two times a day (BID) | OPHTHALMIC | Status: AC
Start: 2015-02-14 — End: 2015-02-28

## 2015-02-14 NOTE — Progress Notes (Signed)
Bonnie Tucker is a 49 year old female patient of Malachy Mood, MD, MD who presents to the clinic today with a headache.   SUBJECTIVE:   The patient presents today with the following concerns:   1. Headache      Patient Active Problem List:     Mirena IUD   MIGRAINE NOS   Obstructive sleep apnea (adult) (pediatric)   Obesity (BMI 35.0-39.9 without comorbidity)   Hemorrhage of rectum and anus   Diverticula of colon   Excessive or frequent menstruation           Current Outpatient Prescriptions on File Prior to Visit:     levonorgestrel (MIRENA) 20 MCG/24HR IUD  1 Device by Intrauterine route continuous. As Instructed  Disp: 1 Device  Rfl: 0      Current Facility-Administered Medications on File Prior to Visit:     ibuprofen (ADVIL,MOTRIN) tablet 800 mg  800 mg  Oral  Once  David Stall       All medications reviewed with patient.       Review of Patient's Allergies indicates:      Nkda [please verify*        Smoking Status: Never Smoker   Smokeless Status: Never Used   Alcohol Use: No     Medical/Surgical/Family History reviewed with the patient. Relevant changes have been made in 'history' section.   Recent laboratories and imaging reviewed prior to visit.   Review of Systems/HPI:   Headache:   - Began 2 weeks ago   - Intermittent for 2 weeks   - 6 days woke up at 3 AM with severe headache, dizziness, nausea, vomitting   - Nausea and dizziness last for > 3 hours, had to stay home from work   - Used husbands oxygen (which he has for migraines), cold pack, heat pack with no relief   - Has tried Tylenol 500 mg with little relief   - Daughter has Imitrex and took 3 days ago and 10 days ago, helped a little bit   - Describes headache as pain in bitemporal area, pressure behind the eyes   - Very sensitive skin on her scalp  - also endorses eye swelling and redness  - ? Of tension in neck muscles, feels that headache improves with massage   - Has a hx of migraines but states that these headaches are not like  the headaches that she has had in the past. Also states that she had a concussion last year and these headaches do not feel the same as the headache she had after concussion last year either.   - Today she woke up at 2 AM today with headache, swollen face, sweating, nausea   - Endorses chills   - LMP 02/06/15, has mirena IUD   - Recently has seen ophthalmologist: no issues.   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   - Denies fevers, nasal congestion, cough, sore throat, recent travel, numbness or tingling anywhere on body, weakness, recent weight loss or weight gain, new stressors at home or at work.   All other systems reviewed and negative.   OBJECTIVE:   Vital Signs:       BP 120/82 mmHg   Pulse 82   Temp(Src) 97.9 F (36.6 C) (Oral)   Wt 93.26 kg (205 lb 9.6 oz)   LMP 02/06/2015  Pain Score: 0 (0/10)   Physical Exam:   General: Well developed, well nourished female sitting comfortably   HEENT: Head normocephalic with palpable ridge at base of skull consistent with skin fold, head atraumatic, PERRLA, EOMI   Neck: Without LAD or thyromegaly, full range of motion   Cardiovascular: RRR with normal S1 and S2, no m/r/g appreciated   Respiratory: lungs clear to auscultation bilaterally without wheeze or rhonci, normal work of breathing   Skin: Warm and dry without concerning lesions   Neurologic: Alert and oriented X3, cranial nerves 2-12 intact bilaterally, sensation and strength normal, gait normal, coordination normal.   Psychiatric: Mood and behavior normal with appropriate affect   ASSESSMENT AND PLAN:   Additional plans reviewed with patient and listed below under patient instructions and provided in AVS.   (R51) Headache, unspecified headache type (primary encounter diagnosis)   (Z86.69) History of migraine   Comment: Pt with 2 week hx of new onset headaches that are different than headaches she has had in the past  and not relieved by tylenol, heat or cold packs, oxygen therapy, or imitrex. Likely that this is a tension headache that is exacerbated by allergies that causing sinus pressure so will treat with ibuprofen for pain relief and to decrease muscle inflammation in the back of the neck. Will also give pt zaditor opthalmic drops and zyrtec to treat for allergies as possible cause of bilateral eye swelling and pressure behind the eyes.   Plan: ibuprofen (ADVIL,MOTRIN) 600 MG tablet, ketotifen (ZADITOR) 0.025 % ophthalmic solution, cetirizine (ZYRTEC) 10 MG tablet. Will place REFERRAL TO NEUROLOGY ( INT) because patient was without headaches or migraines for many years and now has new onset HA's that are unlike any she has had in the past and is frequently being woken up during the night from head pain and missing work. Pt was told to try Ibuprofen, Zatidor drops, and Zyrtec and if all symptoms as relieved she may cancel neurology appointment, but if not follow up with neuro. Pt to go to ED if she experiences severe headache, HA that is not relieved by anything, or Nausea/vomitting/dizziness associated with the headache that does not get better with medication.   HEALTH MAINTENANCE:   AWQ Questionnaire due on 05/31/1984   We discussed the patients current medications including proper use and potential side effects. The patient expressed understanding and no barriers to adherence were identified.   1. The patient indicates understanding of these issues and agrees with the plan. Brief care plan is updated and reviewed with the patient.   2. The patient is given an After Visit Summary sheet that lists all medications with directions, allergies, orders placed during this encounter, and follow-up instructions.   3. I reviewed the patient's medical information and medical history   4. I reconciled the patient's medication list and prepared and supplied needed refills.   5. I have reviewed the past medical, family, and social  history sections including the medications and allergies.           Saide Lanuza B. AmityFickler, [place]

## 2015-02-14 NOTE — Progress Notes (Addendum)
Bonnie Tucker is a 49 year old female patient of Malachy MoodJanet Young, MD, MD who presents to the clinic today with a headache.    SUBJECTIVE:    The patient presents today with the following concerns:  1. Headache    Patient Active Problem List:     Mirena IUD     MIGRAINE NOS     Obstructive sleep apnea (adult) (pediatric)     Obesity (BMI 35.0-39.9 without comorbidity)     Hemorrhage of rectum and anus     Diverticula of colon     Excessive or frequent menstruation      Current Outpatient Prescriptions on File Prior to Visit:  levonorgestrel (MIRENA) 20 MCG/24HR IUD 1 Device by Intrauterine route continuous. As Instructed Disp: 1 Device Rfl: 0     Current Facility-Administered Medications on File Prior to Visit:  ibuprofen (ADVIL,MOTRIN) tablet 800 mg 800 mg Oral Once David StallPatricia Chaudhuri     All medications reviewed with patient.  Review of Patient's Allergies indicates:   Nkda [please verify*        Smoking Status: Never Smoker                      Smokeless Status: Never Used                        Alcohol Use: No              Medical/Surgical/Family History reviewed with the patient.  Relevant changes have been made in 'history' section.    Recent laboratories and imaging reviewed prior to visit.    Review of Systems/HPI:    Headache:  - Began 2 weeks ago  - Intermittent for 2 weeks  - 6 days woke up at 3 AM with severe headache, dizziness, nausea, vomitting  - Nausea and dizziness last for > 3 hours, had to stay home from work  - Used husbands oxygen, cold pack, heat pack with no relief  - Has tried Tylenol 500 mg with little relief  - Daughter has Imitrex and took 3 days ago and 10 days ago, helped a little bit  - Describes headache as pain in bitemporal area, pressure behind the eyes  - Very sensitive skin on her scalp  - Has a hx of migraines but states that these headaches are not like the headaches that she has had in the past. Also states that she had a concussion last year and these headaches do  not feel the same as the headache she had after concussion last year either.  - Today she woke up at 2 AM today with headache, swollen face, sweating, nausea  - Endorses chills  - LMP 02/06/15, has mirena IUD  - Recently has seen ophthalmologist: no issues.  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  - Denies fevers, nasal congestion, cough, sore throat, recent travel, numbness or tingling anywhere on body, weakness, recent weight loss or weight gain, new stressors at home or at work.    All other systems reviewed and negative.    OBJECTIVE:    Vital Signs:  BP 120/82 mmHg   Pulse 82   Temp(Src) 97.9 F (36.6 C) (Oral)   Wt 93.26 kg (205 lb 9.6 oz)   LMP 02/06/2015  Pain Score: 0 (0/10)    Physical Exam:  General: Well developed, well nourished  female sitting comfortably  HEENT: Head normocephalic with palpable ridge at base of skull consistent with skin fold, head atraumatic, PERRLA, EOMI  Neck: Without LAD or thyromegaly, full range of motion   Cardiovascular: RRR with normal S1 and S2, no m/r/g appreciated  Respiratory: lungs clear to auscultation bilaterally without wheeze or rhonci, normal work of breathing  Skin: Warm and dry without concerning lesions  Neurologic: Alert and oriented X3, cranial nerves 2-12 intact bilaterally, sensation and strength normal, gait normal, coordination normal.  Psychiatric:  Mood and behavior normal with appropriate affect    ASSESSMENT AND PLAN:  Additional plans reviewed with patient and listed below under patient instructions and provided in AVS.  (R51) Headache, unspecified headache type  (primary encounter diagnosis)  (Z86.69) History of migraine  Comment: Pt with 2 week hx of new onset headaches that are different than headaches she has had in the past and not relieved by tylenol, heat or cold packs, oxygen therapy, or imitrex.  Likely that this is a tension headache that is exacerbated by  allergies that causing sinus pressure so will treat with ibuprofen for pain relief and to decrease muscle inflammation in the back of the neck.  Will also give pt zaditor opthalmic drops and zyrtec to treat for allergies as possible cause of bilateral eye swelling and pressure behind the eyes.  Plan: ibuprofen (ADVIL,MOTRIN) 600 MG tablet,  ketotifen (ZADITOR) 0.025 % ophthalmic solution, cetirizine (ZYRTEC) 10 MG tablet. Will place REFERRAL TO NEUROLOGY ( INT) because patient was without headaches or migraines for many years and now has new onset HA's that are unlike any she has had in the past and is frequently being woken up during the night from head pain and missing work.  Pt was told to try Ibuprofen, Zatidor drops, and Zyrtec and if all symptoms as relieved she may cancel neurology appointment, but if not follow up with neuro.  Pt to go to ED if she experiences severe headache, HA that is not relieved by anything, or Nausea/vomitting/dizziness associated with the headache that does not get better with medication.    HEALTH MAINTENANCE:  AWQ Questionnaire due on 05/31/1984    We discussed the patients current medications including proper use and potential side effects. The patient expressed understanding and no barriers to adherence were identified.     1. The patient indicates understanding of these issues and agrees with the plan. Brief care plan is updated and reviewed with the patient.   2. The patient is given an After Visit Summary sheet that lists all medications with directions, allergies, orders placed during this encounter, and follow-up instructions.   3. I reviewed the patient's medical information and medical history   4. I reconciled the patient's medication list and prepared and supplied needed refills.   5. I have reviewed the past medical, family, and social history sections including the medications and allergies.    Kalman Drape

## 2015-03-20 ENCOUNTER — Ambulatory Visit (HOSPITAL_BASED_OUTPATIENT_CLINIC_OR_DEPARTMENT_OTHER): Payer: PRIVATE HEALTH INSURANCE | Admitting: Neurology

## 2015-09-27 ENCOUNTER — Ambulatory Visit (HOSPITAL_BASED_OUTPATIENT_CLINIC_OR_DEPARTMENT_OTHER): Payer: PRIVATE HEALTH INSURANCE | Admitting: Medical

## 2015-09-27 ENCOUNTER — Encounter (HOSPITAL_BASED_OUTPATIENT_CLINIC_OR_DEPARTMENT_OTHER): Payer: Self-pay | Admitting: Medical

## 2015-09-27 VITALS — BP 130/80 | HR 80 | Temp 97.6°F | Wt 210.0 lb

## 2015-09-27 DIAGNOSIS — Z1239 Encounter for other screening for malignant neoplasm of breast: Secondary | ICD-10-CM

## 2015-09-27 DIAGNOSIS — E669 Obesity, unspecified: Secondary | ICD-10-CM

## 2015-09-27 DIAGNOSIS — R5383 Other fatigue: Secondary | ICD-10-CM

## 2015-09-27 DIAGNOSIS — R1032 Left lower quadrant pain: Principal | ICD-10-CM

## 2015-09-27 DIAGNOSIS — R1031 Right lower quadrant pain: Secondary | ICD-10-CM

## 2015-09-27 LAB — POC URINALYSIS
BILIRUBIN, URINE: NEGATIVE
GLUCOSE,URINE: NEGATIVE
KETONE, URINE: NEGATIVE
LEUKOCYTE ESTERASE: NEGATIVE
NITRITE, URINE: NEGATIVE
OCCULT BLOOD, URINE: NEGATIVE
PH URINE: 7 (ref 5.0–8.0)
PROTEIN, URINE: NEGATIVE
SPECIFIC GRAVITY, URINE: 1.02 (ref 1.003–1.030)
UROBILINOGEN URINE: 0.2 (ref 0.2–1.0)

## 2015-09-27 LAB — CBC WITH PLATELET
HEMATOCRIT: 40.8 % (ref 34.1–44.9)
HEMOGLOBIN: 13.6 g/dL (ref 11.2–15.7)
MEAN CORP HGB CONC: 33.3 g/dL (ref 31.0–37.0)
MEAN CORPUSCULAR HGB: 32.4 pg (ref 26.0–34.0)
MEAN CORPUSCULAR VOL: 97.1 fL (ref 80.0–100.0)
MEAN PLATELET VOLUME: 10.2 fL (ref 8.7–12.5)
PLATELET COUNT: 247 10*3/uL (ref 150–400)
RBC DISTRIBUTION WIDTH STD DEV: 44.7 fL (ref 35.1–46.3)
RBC DISTRIBUTION WIDTH: 12.8 % (ref 11.5–14.3)
RED BLOOD CELL COUNT: 4.2 M/uL (ref 3.90–5.20)
WHITE BLOOD CELL COUNT: 7.6 10*3/uL (ref 4.0–11.0)

## 2015-09-27 LAB — URINE PREGNANCY TEST (POINT OF CARE): HCG QUALITATIVE URINE: NEGATIVE

## 2015-09-27 MED ORDER — IBUPROFEN 600 MG PO TABS
600.0000 mg | ORAL_TABLET | Freq: Three times a day (TID) | ORAL | 0 refills | Status: AC | PRN
Start: 2015-09-27 — End: 2015-10-11

## 2015-09-27 NOTE — Progress Notes (Signed)
Bonnie Tucker is a 49 year old female  Patient of Rishita R. Allena Katz, MD      CC: back and abd pain x 2 days  Reports that it gradually came on  Reports that she has pain in lower abd that radiates to the back  "feels like the pain you get when you have a baby"  Reports that it feels like her period is coming, but it is not coming  Patient's last menstrual period was 08/23/2015.  Reports that it usually comes every 23-25 days    Notes that she has had her mirena for years and never felt her period coming  Still has the mirena (since 08/2013)    Reports she took ibuprofen  with relief  Reports she has been using ice, but someone told her to do heat       She denies diarrhea/constipation  She denies n/v  She denies any abnormal vag d/c, itching, or odor  She denies any dysuria or hematuria      Reports that mother went through menopause in early 13    Also feeling tired    Needs letter for work    Obesity  Reports she has been gaining weight  Reports she doesn't really exercise  Reports trying to eat healthy    Most Recent Weight Reading(s)  09/27/15 : 95.3 kg (210 lb)  02/14/15 : 93.3 kg (205 lb 9.6 oz)  11/09/14 : 92.5 kg (204 lb)  10/11/14 : 88.5 kg (195 lb)  11/19/13 : 95.3 kg (210 lb)    Body mass index is 38.41 kg/(m^2).        ROS as per HPI      Patient Active Problem List:     Mirena IUD     Unspecified migraine     Obstructive sleep apnea (adult) (pediatric)     Obesity (BMI 35.0-39.9 without comorbidity)     Hemorrhage of rectum and anus     Diverticula of colon     Excessive or frequent menstruation      Past Medical History    Functional ovarian cysts     Comment: small, seen on u/s    GERD (gastroesophageal reflux disease)     MIGRAINE NOS 07/24/2006    Comment: Complex migraine - off meds    OBSTRUCTIVE SLEEP APNEA, ADULT & PED 07/24/2006    Comment: Severe obstructive sleep apnea - on CPAP    OVARIAN CYST NEC/NOS 07/24/2006    Comment: Recurrent small simple ovarian cyst - functional    Sleep apnea      Comment: no longer uses CPAP - no longer has sxs of sleep apnea    Wears eyeglasses     Comment: reading glasses       BP 130/80  Pulse 80  Temp 97.6 F (36.4 C) (Temporal)  Wt 95.3 kg (210 lb)  LMP 08/23/2015  SpO2 98%  BMI 38.41 kg/m2        Physical Exam   Constitutional: She is oriented to person, place, and time. She appears well-developed and well-nourished. No distress.   Obese, Body mass index is 38.41 kg/(m^2).   HENT:   Head: Normocephalic and atraumatic.   Eyes: Conjunctivae and EOM are normal. No scleral icterus.   Neck: Normal range of motion.   Cardiovascular: Normal rate.    Pulmonary/Chest: Effort normal.   Abdominal: Soft. She exhibits no distension and no mass. There is tenderness in the right lower quadrant, suprapubic area and left  lower quadrant. There is no rebound and no guarding.       Musculoskeletal: Normal range of motion.        Lumbar back: She exhibits tenderness. She exhibits normal range of motion and no bony tenderness.        Back:    Neurological: She is alert and oriented to person, place, and time. No cranial nerve deficit. Coordination normal.   Skin: Skin is warm and dry.   Psychiatric: She has a normal mood and affect. Her behavior is normal.   Vitals reviewed.      Assessment/Plan:  (R10.31,  R10.32) Bilateral lower abdominal cramping  (primary encounter diagnosis)  Comment: urine dip and urine hcg negative. She denies any n/v/d, constipation. Not related to eating. Low yield for imaging right now as there are no alarming s/s. This could be related to menstrual cycle. Discussed she could be getting menstrual cramps and her period may be coming as it is a few days later than usual. Discussed she could also be in perimenopausal state as she reports that mother went through menopause in early 9s. Discussed conservative measures for now - heating pad, ibuprofen prn. If she is having severe cramps, she should go to the ED for labs and imaging.   Plan: URINE PREGNANCY TEST  (POINT OF CARE), POC         URINALYSIS, ibuprofen (ADVIL,MOTRIN) 600 MG         tablet      (R53.83) Fatigue, unspecified type  Plan: THYROID SCREEN TSH REFLEX FT4, CBC WITH         PLATELET, VITAMIN D,25 HYDROXY      (E66.9) Obesity (BMI 35.0-39.9 without comorbidity)  Comment: discussed diet and exercise. More information on how to manage weight printed from [employer] for patient. Discussed she can see nutrition if interested. also can refer to [facility] if she needs help. Will check A1c. Previous labs have been within normal range.    HEMOGLOBIN A1C (%)   Date Value   07/29/2013 5.5   01/29/2012 5.5   ----------  Plan: HEMOGLOBIN A1C  Low-fat, low-carb diet  Get more ex      (Z12.39) Screening for malignant neoplasm of breast  Plan:  MAMMOGRAPHY SCREENING BILATERAL W CAD      - rtc for CPE with PCP/PA

## 2015-09-28 ENCOUNTER — Other Ambulatory Visit (HOSPITAL_BASED_OUTPATIENT_CLINIC_OR_DEPARTMENT_OTHER): Payer: Self-pay | Admitting: Medical

## 2015-09-28 DIAGNOSIS — E559 Vitamin D deficiency, unspecified: Secondary | ICD-10-CM

## 2015-09-28 LAB — VITAMIN D,25 HYDROXY: VITAMIN D,25 HYDROXY: 16 ng/mL — CL (ref 30.0–100.0)

## 2015-09-28 LAB — HEMOGLOBIN A1C
ESTIMATED AVERAGE GLUCOSE: 103 (ref 74–160)
HEMOGLOBIN A1C: 5.2 % (ref 4.0–5.6)

## 2015-09-28 LAB — THYROID SCREEN TSH REFLEX FT4: THYROID SCREEN TSH REFLEX FT4: 2.63 u[IU]/mL (ref 0.358–3.740)

## 2015-09-28 MED ORDER — ERGOCALCIFEROL 1.25 MG (50000 UT) PO CAPS
50000.0000 [IU] | ORAL_CAPSULE | ORAL | 2 refills | Status: DC
Start: 2015-09-28 — End: 2015-11-08

## 2015-09-28 NOTE — Progress Notes (Signed)
Hi Sarum[place],    Please make a result note with the following comments:    All your labs are normal, except vitamin D. Your vitamin D level is low.  I have sent a prescription for vitamin D 50,000 units per WEEK for 12 weeks to your pharmacy. Once you finish the prescription, please continue to take vitamin D 1000 units per day, which you can buy over-the-counter.

## 2015-11-08 ENCOUNTER — Ambulatory Visit (HOSPITAL_BASED_OUTPATIENT_CLINIC_OR_DEPARTMENT_OTHER): Payer: PRIVATE HEALTH INSURANCE | Admitting: Family Medicine

## 2015-11-08 ENCOUNTER — Encounter (HOSPITAL_BASED_OUTPATIENT_CLINIC_OR_DEPARTMENT_OTHER): Payer: Self-pay | Admitting: Family Medicine

## 2015-11-08 VITALS — BP 118/89 | HR 73 | Temp 97.8°F | Ht 61.0 in | Wt 204.0 lb

## 2015-11-08 DIAGNOSIS — E559 Vitamin D deficiency, unspecified: Secondary | ICD-10-CM

## 2015-11-08 DIAGNOSIS — Z0000 Encounter for general adult medical examination without abnormal findings: Secondary | ICD-10-CM

## 2015-11-08 NOTE — Progress Notes (Signed)
Bonnie Tucker is a 49 year old female patient of Thailyn Khalid R. Allena Katz, MD who presents to the clinic today for evaluation.  Patient presents with:  Physical: CPE    HPI:  Patient here today for CPE.   Had been taking High dose vitamin D, but hasn't started taking 1000units otc.   Last eye exam was 1 year ago, doesn't wear glasses, asked to come back in 2 years for visit  Last Dental visit 2 weeks ago.        Patient Active Problem List:     Mirena IUD     Unspecified migraine     Obstructive sleep apnea (adult) (pediatric)     Obesity (BMI 35.0-39.9 without comorbidity)     Hemorrhage of rectum and anus     Diverticula of colon     Excessive or frequent menstruation        Past Surgical History    NO SIGNIFICANT SURGICAL HISTORY         Social History    Marital status: Married             Spouse name:                       Years of education:                 Number of children:               Social History Main Topics    Smoking status: Never Smoker                                                                Smokeless status: Never Used                        Alcohol use: No              Drug use: No              Sexual activity: Yes               Partners with: Female       Birth control/protection: IUD       Comment: paraguard 2000    Other Topics            Concern  Blood Transfusions      No  Seat Belt               Yes    Social History Narrative    She is a Engineer, water, 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     Says it's not too stressful.     Married.     Has two adult children.         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    Living with husband.    Children are out of the house.          Family History    Hypertension Mother     OTHER Father     Comment: killed by terrorists [place]    In Good Health Brother     In Au Sable Health Brother  Cancer - Breast FamHxNeg     Asthma Sister     Alcohol/Drug Abuse FamHxNeg     Cancer - Other FamHxNeg      Diabetes FamHxNeg     Heart FamHxNeg     Lipids FamHxNeg     Mental/Emotional Disorders FamHxNeg     Stroke FamHxNeg     Pulmonary FamHxNeg     Renal FamHxNeg     TB FamHxNeg     Thyroid FamHxNeg     In Good Health Mother          Current Outpatient Prescriptions:     ergocalciferol (VITAMIN D2) 50000 UNIT capsule, Take 1 capsule by mouth once a week, Disp: 4 capsule, Rfl: 2    levonorgestrel (MIRENA) 20 MCG/24HR IUD, 1 Device by Intrauterine route continuous. As Instructed, Disp: 1 Device, Rfl: 0    Current Facility-Administered Medications:     ibuprofen (ADVIL,MOTRIN) tablet 800 mg, 800 mg, Oral, Once, David StallPatricia Chaudhuri    BP 118/89  Pulse 73  Temp 97.8 F (36.6 C) (Temporal)  Ht 5\' 1"  (1.549 m)  Wt 92.5 kg (204 lb)  LMP 10/27/2015  SpO2 98%  BMI 38.55 kg/m2  Pain Score: 0 (0/10)       Review of Systems   Constitutional: Negative for chills, fever, malaise/fatigue and weight loss.   HENT: Negative for ear pain and hearing loss.    Eyes: Negative for blurred vision, double vision and pain.   Respiratory: Negative for cough, shortness of breath and wheezing.    Cardiovascular: Negative for chest pain, palpitations, orthopnea and leg swelling.   Gastrointestinal: Negative for abdominal pain, blood in stool, constipation, diarrhea, nausea and vomiting.   Genitourinary: Negative for dysuria, frequency and hematuria.   Musculoskeletal: Negative for joint pain and myalgias.   Skin: Negative for rash.   Neurological: Negative for dizziness, tingling, sensory change and headaches.   Endo/Heme/Allergies: Negative for polydipsia.   Psychiatric/Behavioral: Negative for depression and suicidal ideas. The patient is not nervous/anxious.      Physical Exam   Constitutional: She is oriented to person, place, and time. She appears well-developed and well-nourished.   HENT:   Head: Normocephalic and atraumatic.   Right Ear: External ear normal.   Left Ear: External ear normal.   Nose: No mucosal edema or rhinorrhea. Right  sinus exhibits no maxillary sinus tenderness and no frontal sinus tenderness. Left sinus exhibits no maxillary sinus tenderness and no frontal sinus tenderness.   Eyes: Conjunctivae and EOM are normal. Pupils are equal, round, and reactive to light. Left eye exhibits no discharge. No scleral icterus.   Neck: Normal range of motion. Neck supple. No thyromegaly present.   Cardiovascular: Normal rate, regular rhythm and normal heart sounds.    Pulmonary/Chest: Breath sounds normal. No respiratory distress. She has no wheezes. She has no rales.   Abdominal: Soft. Bowel sounds are normal. She exhibits no distension and no mass. There is no tenderness.   Musculoskeletal: Normal range of motion. She exhibits no edema or tenderness.   Lymphadenopathy:     She has no cervical adenopathy.   Neurological: She is alert and oriented to person, place, and time. She displays normal reflexes. No cranial nerve deficit.   Skin: Skin is warm and dry. No rash noted.   Psychiatric: She has a normal mood and affect.         A/P:  (Z00.00) Preventative health care  Comment: Physical done at this time.   Plan: Patient UTD  on pap, has mammogram scheduled.  Had Colonoscopy in 2013- 10 year follow up.     (E55.9) Vitamin D deficiency  Comment: Discussed need for continued Vitamin D supplement with OTC supplements after completing her high dose therapy.   Plan: Patient offered prescription for 1000units vitamin D today, prefers to get it OTC at this time        We discussed the patients current medications. The patient expressed understanding and no barriers to adherence were identified.   1. The patient indicates understanding of these issues and agrees with the plan. Brief care plan is updated and reviewed with the patient.   2. The patient is given an After Visit Summary sheet that lists all medications with directions, allergies, orders placed during this encounter, and follow-up instructions.   3. I reviewed the patient's medical  information and medical history   4. I reconciled the patient's medication list and prepared and supplied needed refills.   5. I have reviewed the past medical, family, and social history sections including the medications and allergies.

## 2015-11-24 ENCOUNTER — Telehealth (HOSPITAL_BASED_OUTPATIENT_CLINIC_OR_DEPARTMENT_OTHER): Payer: Self-pay | Admitting: Registered Nurse

## 2015-11-24 NOTE — Telephone Encounter (Signed)
-----   Message from HallsteadLoubaba Riahy sent at 11/24/2015 12:27 PM EST -----  Regarding: speak to nurse only  Contact: [phone]      Bonnie Tucker [phone], 49 year old, female, Telephone Information:  Home Phone      [phone]  Work Phone      Not on file.  Mobile          [phone]      Patient's Preferred Pharmacy:     [id number] [place] [place], [place] - [address]  Phone: [phone] Fax: [phone]      CONFIRMED TODAY: Yes    CALL BACK NUMBER: [phone]    Patient's PCP: Rishita R. Allena KatzPatel, MD    Person calling on behalf of patient: Patient (self)    Calls today to speak to nurse only.

## 2015-11-24 NOTE — Telephone Encounter (Signed)
-----   Message from DeweyvilleJoeliana Flores sent at 11/24/2015 11:12 AM EST -----  Regarding: inquiring about vaccines   Lavon PaganiniMaria Ester Cowie [phone], 49 year old, female, Telephone Information:  Home Phone      [phone]  Mobile          [phone]     [id number] [place] - [address]  Phone: [phone] Fax: [phone]    Cleotis LemaCALL BACK NUMBER: [phone]    Patient's language of care: English    Patient's PCP: Rishita R. Allena KatzPatel, MD    Person calling on behalf of patient: Daughter    Calls today states pt feels like she was being rushed on the day of her visit on 11/09. States her spouse was seen at the [place] [facility] and he got vaccines and other concerns were reviewed. Also states pt mentioned to pcp that she was traveling and inquired about vaccination but states this was disregarded. Daughter would like to discuss with RN.

## 2015-11-24 NOTE — Progress Notes (Signed)
Returned call to pt  Left message to call clinic

## 2015-11-24 NOTE — Progress Notes (Signed)
Returned call   Daughter states pt was seen in clinic  States pt felt rushed in clinic and did not feel she had a proper physical  States pt had also inquired about vaccines for travel  Daughter inquiring if pt is up to date on vaccines  Informed can not discuss information in pts chart with out pts permission due to HIPPA laws  States mother speaks limited english, informed can use interpreter  Daughter inquiring how pt can change provider  Informed can choose new provider and change with PCP      Inquiring about Hep B and hep a  No titers in chart, no hep b or hep A administered in clinic  Message to provider to review and advise

## 2015-11-27 ENCOUNTER — Ambulatory Visit (HOSPITAL_BASED_OUTPATIENT_CLINIC_OR_DEPARTMENT_OTHER): Payer: PRIVATE HEALTH INSURANCE | Admitting: Registered Nurse

## 2015-11-27 DIAGNOSIS — Z23 Encounter for immunization: Secondary | ICD-10-CM

## 2015-11-27 NOTE — Progress Notes (Signed)
Benefit analysis for the Typhim Vaccine:    The vaccine is covered under the patient’s prescription coverage.    Please choose 90691.2 Typhim (Prior Auth/Pharmacy) and notify [facility] Refill via cc’d chart once the vaccine has been administered.

## 2015-11-27 NOTE — Progress Notes (Signed)
Pt is [place]  Informed can come to clinic for Hep AB vaccine  Will message provider to inquire about other vaccines needed    [place]  Pt states she thinks she is staying with in [place], may be traveling 45 minutes in the [place] area    Will check with central refill in regards to typhoid

## 2015-11-27 NOTE — Progress Notes (Signed)
11/27/2015  Typhoid and Twinrix given per Dr. Allena KatzPatel    VIS given prior to administration and reviewed with the patient and or legal guardian. Patient understands the disease and the vaccine. See immunization/Injection module or chart review for date of publication and additional information.  Clide CliffSary Liyana Suniga, RN

## 2015-11-27 NOTE — Progress Notes (Signed)
Spoke with pt  Appt scheduled for hep A?\/B and typhoid vaccine today

## 2015-11-28 ENCOUNTER — Ambulatory Visit: Payer: Self-pay | Admitting: Medical

## 2015-11-28 DIAGNOSIS — Z1239 Encounter for other screening for malignant neoplasm of breast: Secondary | ICD-10-CM

## 2015-11-28 NOTE — Addendum Note (Signed)
Addended by: Mardene CelesteEFALO, DEBORAH on: 11/28/2015 04:35 PM     Modules accepted: Orders

## 2015-11-29 LAB — MA SCREENING MAMMO BILATERAL DIGITAL WITH DBT & CAD

## 2016-01-09 ENCOUNTER — Ambulatory Visit (HOSPITAL_BASED_OUTPATIENT_CLINIC_OR_DEPARTMENT_OTHER): Payer: PRIVATE HEALTH INSURANCE | Admitting: Registered Nurse

## 2016-01-31 ENCOUNTER — Ambulatory Visit (HOSPITAL_BASED_OUTPATIENT_CLINIC_OR_DEPARTMENT_OTHER): Payer: PRIVATE HEALTH INSURANCE | Admitting: Family Medicine

## 2016-01-31 ENCOUNTER — Encounter (HOSPITAL_BASED_OUTPATIENT_CLINIC_OR_DEPARTMENT_OTHER): Payer: Self-pay | Admitting: Family Medicine

## 2016-01-31 VITALS — BP 128/80 | HR 78 | Temp 98.9°F | Wt 207.0 lb

## 2016-01-31 DIAGNOSIS — R5383 Other fatigue: Principal | ICD-10-CM

## 2016-01-31 DIAGNOSIS — R5381 Other malaise: Secondary | ICD-10-CM

## 2016-01-31 DIAGNOSIS — R51 Headache: Secondary | ICD-10-CM

## 2016-01-31 DIAGNOSIS — S139XXA Sprain of joints and ligaments of unspecified parts of neck, initial encounter: Secondary | ICD-10-CM

## 2016-01-31 DIAGNOSIS — N926 Irregular menstruation, unspecified: Secondary | ICD-10-CM

## 2016-01-31 DIAGNOSIS — R519 Headache, unspecified: Secondary | ICD-10-CM

## 2016-01-31 LAB — COMP METABOLIC PANEL, FASTING
ALANINE AMINOTRANSFERASE: 24 U/L (ref 12–45)
ALBUMIN: 3.7 g/dL (ref 3.4–5.0)
ALKALINE PHOSPHATASE: 84 U/L (ref 45–117)
ANION GAP: 8 mmol/L (ref 5–15)
ASPARTATE AMINOTRANSFERASE: 14 U/L (ref 8–34)
BILIRUBIN TOTAL: 0.4 mg/dL (ref 0.2–1.0)
BUN (UREA NITROGEN): 12 mg/dL (ref 7–18)
CALCIUM: 8.7 mg/dL (ref 8.5–10.1)
CARBON DIOXIDE: 27 mmol/L (ref 21–32)
CHLORIDE: 108 mmol/L — ABNORMAL HIGH (ref 98–107)
CREATININE: 0.8 mg/dL (ref 0.4–1.2)
ESTIMATED GLOMERULAR FILT RATE: >60 mL/min (ref >60)
GLUCOSE FASTING: 90 mg/dL (ref 74–106)
POTASSIUM: 4 mmol/L (ref 3.5–5.1)
SODIUM: 143 mmol/L (ref 136–145)
TOTAL PROTEIN: 6.6 g/dL (ref 6.4–8.2)

## 2016-01-31 LAB — CBC, PLATELET & DIFFERENTIAL
ABSOLUTE BASO COUNT: 0.1 10*3/uL (ref 0.0–0.1)
ABSOLUTE EOSINOPHIL COUNT: 0.8 10*3/uL (ref 0.0–0.8)
ABSOLUTE IMM GRAN COUNT: 0.01 10*3/uL (ref 0.00–0.03)
ABSOLUTE LYMPH COUNT: 2.1 10*3/uL (ref 0.6–5.9)
ABSOLUTE MONO COUNT: 0.7 10*3/uL (ref 0.2–1.4)
ABSOLUTE NEUTROPHIL COUNT: 4 10*3/uL (ref 1.6–8.3)
BASOPHIL %: 0.8 % (ref 0.0–1.2)
EOSINOPHIL %: 10.3 % — ABNORMAL HIGH (ref 0.0–7.0)
HEMATOCRIT: 41.3 % (ref 34.1–44.9)
HEMOGLOBIN: 13.8 g/dL (ref 11.2–15.7)
IMMATURE GRANULOCYTE %: 0.1 % (ref 0.0–0.4)
LYMPHOCYTE %: 27 % (ref 15.0–54.0)
MEAN CORP HGB CONC: 33.4 g/dL (ref 31.0–37.0)
MEAN CORPUSCULAR HGB: 32.3 pg (ref 26.0–34.0)
MEAN CORPUSCULAR VOL: 96.7 fL (ref 80.0–100.0)
MEAN PLATELET VOLUME: 10.6 fL (ref 8.7–12.5)
MONOCYTE %: 9.1 % (ref 4.0–13.0)
NEUTROPHIL %: 52.7 % (ref 40.0–75.0)
PLATELET COUNT: 243 10*3/uL (ref 150–400)
RBC DISTRIBUTION WIDTH STD DEV: 44.7 fL (ref 35.1–46.3)
RBC DISTRIBUTION WIDTH: 12.8 % (ref 11.5–14.3)
RED BLOOD CELL COUNT: 4.27 M/uL (ref 3.90–5.20)
WHITE BLOOD CELL COUNT: 7.6 10*3/uL (ref 4.0–11.0)

## 2016-01-31 LAB — THYROID SCREEN TSH REFLEX FT4: THYROID SCREEN TSH REFLEX FT4: 2.44 u[IU]/mL (ref 0.358–3.740)

## 2016-01-31 LAB — URINE PREGNANCY TEST (POINT OF CARE): HCG QUALITATIVE URINE: NEGATIVE

## 2016-01-31 MED ORDER — DICLOFENAC SODIUM 1 % TD GEL
2.00 g | Freq: Two times a day (BID) | TRANSDERMAL | 1 refills | Status: AC
Start: 2016-01-31 — End: 2016-03-01

## 2016-01-31 NOTE — Progress Notes (Addendum)
Bonnie Tucker is a 50 year old female patient of Kennetha Pearman R. Allena Katz, MD who presents to the clinic today for evaluation.  Patient presents with:  Headache  Fatigue: bodyaches X 5 days      HPI:   Fatigue  Pt reports not feeling well for the past five days, very fatigued, whole body is sore, has HA, and low appetite  Pt reports she vomited once yesterday l   Pt drank 2 glasses of water today, usually drinks 5-6 glasses per day  Denies sore throat, nasal congestion, post nasal drip, diarrhea, light or sound sensitivity, ear pain, numbness or tingling in face or one side of body   Denies sick contacts, any trauma to the area   Requests letter to excuse from work for yesterday and today     HA  Pt has been experiencing HA for 2 years now since she fell and got a concussion   Pt has not f/u with Dr. Cathie Hoops for HA as previously recommended and referred   Pt has been taking Motrin  q6hrs, pt took 2 Motrin today, one around 9:30am and one 2:00pm  Notes her head feels very heavy and has pain over the forehead which is pressure like in nature, non-radiating, no light or sound sensitivity associated with it, and no nausea or vomiting associated with HAs.     Neck Pain  Reports neck pain from the bottom of head going down into the back and up to the base of the head   A throbbing pain started 2 days ago    Woke up with the pain   6/10 at this time   Has been using ibuprofen q 6 hours, sometimes helps with the pain   Hasn't been using heat or ice on the area     Mensturation  Notes she did not have her period for 3 prior to most recent period   LMP started five days ago and was light   Pt has Mirena in place for 3 years   Dr. Prescilla Sours placed the Mirena in 2014     Patient Active Problem List:     Mirena IUD     Unspecified migraine     Obstructive sleep apnea (adult) (pediatric)     Obesity (BMI 35.0-39.9 without comorbidity)     Hemorrhage of rectum and anus     Diverticula of colon     Excessive or frequent  menstruation     Vitamin D deficiency        Past Surgical History    NO SIGNIFICANT SURGICAL HISTORY         Social History    Marital status: Married             Spouse name:                       Years of education:                 Number of children:               Social History Main Topics    Smoking status: Never Smoker  Smokeless status: Never Used                        Alcohol use: No              Drug use: No              Sexual activity: Yes               Partners with: Female       Birth control/protection: IUD       Comment: paraguard 2000    Other Topics            Concern  Blood Transfusions      No  Seat Belt               Yes    Social History Narrative    She is a Engineer, water, 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     Says it's not too stressful.     Married.     Has two adult children.         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    Living with husband.    Children are out of the house.          Family History    Hypertension Mother     OTHER Father     Comment: killed by terrorists [place]    In Good Health Brother     In Good Health Brother     Cancer - Breast FamHxNeg     Asthma Sister     Alcohol/Drug Abuse FamHxNeg     Cancer - Other FamHxNeg     Diabetes FamHxNeg     Heart FamHxNeg     Lipids FamHxNeg     Mental/Emotional Disorders FamHxNeg     Stroke FamHxNeg     Pulmonary FamHxNeg     Renal FamHxNeg     TB FamHxNeg     Thyroid FamHxNeg     In Good Health Mother          Current Outpatient Prescriptions:     diclofenac (VOLTAREN) 1 % GEL Gel, Apply 2 g topically 2 (two) times daily Do Not Exceed 32 g in 24 Hours, Disp: 100 g, Rfl: 1    Cholecalciferol (VITAMIN D3) 1000 UNITS TABS Tablet, Take 1 tablet by mouth daily, Disp: , Rfl:     levonorgestrel (MIRENA) 20 MCG/24HR IUD, 1 Device by Intrauterine route continuous. As Instructed, Disp: 1 Device, Rfl: 0    Current  Facility-Administered Medications:     ibuprofen (ADVIL,MOTRIN) tablet 800 mg, 800 mg, Oral, Once, David Stall    BP 128/80  Pulse 78  Temp 98.9 F (37.2 C) (Temporal)  Wt 93.9 kg (207 lb)  LMP 01/26/2016  SpO2 99%  BMI 39.11 kg/m2  Pain Score: 7 (7/10)             Review of Systems   Constitutional: Positive for malaise/fatigue. Negative for chills and fever.   HENT: Negative for congestion, ear pain and sore throat.    Eyes: Negative for blurred vision and photophobia.   Respiratory: Negative for cough, sputum production and shortness of breath.    Cardiovascular: Negative for chest pain and palpitations.   Gastrointestinal: Negative for abdominal pain, blood in stool, constipation, diarrhea, nausea and vomiting.   Musculoskeletal: Positive for neck pain.   Neurological: Positive for headaches.  Negative for dizziness, tingling, sensory change, speech change, focal weakness and loss of consciousness.     Physical Exam   Constitutional: She is oriented to person, place, and time. She appears well-developed and well-nourished. No distress.   HENT:   Head: Normocephalic and atraumatic.   Nose: Nose normal.   Mouth/Throat: Oropharynx is clear and moist.   Eyes: Conjunctivae and EOM are normal. Pupils are equal, round, and reactive to light.   No papilledema noted b/l   Neck: Neck supple. No thyromegaly present.   Decreased ROM on extension, flexion, and rotation of head due to pain   Spasm noted over right scm   No nuchal rigidity   Negative psoas and obturator signs    Cardiovascular: Normal rate, regular rhythm and normal heart sounds.    Pulmonary/Chest: Effort normal and breath sounds normal. No respiratory distress. She has no wheezes.   Neurological: She is alert and oriented to person, place, and time. No cranial nerve deficit.   Skin: Skin is warm. No rash noted.   Psychiatric: She has a normal mood and affect.   Vitals reviewed.        ASSESSMENT/PLAN:  Malaise and fatigue  (primary encounter  diagnosis)  Abnormal menses  Headache disorder  Sprain of neck, initial encounter    (R53.81,  R53.83) Malaise and fatigue  (primary encounter diagnosis)  Comment: Pt reports fatigue and body aches x5 days.   Plan: CBC + PLT + AUTO DIFF, THYROID SCREEN TSH         REFLEX FT4, COMP METABOLIC PANEL, FASTING,         COLLECTION VENOUS BLOOD VENIPUNCTURE for evaluation and further work-up. Discussed likely cause of vomiting, nausea, fatigue could have been a viral infectious process. Will check labs to rule out Thyroid dysfunction, electrolyte imbalance, as well as anemia as causes.    (N92.6) Abnormal menses  Comment: Pt reports LMP was five days ago and was light, before that notes didn't have a period for 3-5 months. Pt has Mirena in place.   Plan: URINE PREGNANCY TEST (POINT OF CARE)- negative Discussed mirena is a likely cause of symptoms such as HA and menstrual irregularities.     (R51) Headache disorder  Comment: Pt has had HA's x2 years since she had a concussion. Reports HA over the forehead.   Plan: Advised to f/u with Dr. Cathie Hoops for HA.   Advised to stop taking Ibuprofen as reason for HA could be rebound HA from overuse of NSAIDs. Discussed use of heat, ice at this time. Given warning signs for immediate f/u in ER to include vomiting, sensory deficiency, blurry vision associated with HA.     (S13.9XXA) Sprain of neck, initial encounter  Comment: C/o neck pain at the bottom of the head that goes into the back as well on right side. On exam pt had spasm noted at the [facility] and no nuchal rigidity, no papilledema noted.   Plan: diclofenac (VOLTAREN) 1 % GEL Gel  Advised to apply heat and ice to the area and to stop taking Ibuprofen at this time. Given voltaren gel to apply to the muscles of the neck for localized pain relief.    S&S include dizziness, N/V, blurry vision, increase in pain, report to ER     RTC in 4 weeks for HA f/u sooner for concerns     We discussed the patients current medications. The patient  expressed understanding and no barriers to adherence were identified.   1. The patient  indicates understanding of these issues and agrees with the plan. Brief care plan is updated and reviewed with the patient.   2. The patient is given an After Visit Summary sheet that lists all medications with directions, allergies, orders placed during this encounter, and follow-up instructions.   3. I reviewed the patient's medical information and medical history   4. I reconciled the patient's medication list and prepared and supplied needed refills.   5. I have reviewed the past medical, family, and social history sections including the medications and allergies.    By signing my name below, I, AnaMaria Musterer,  Attest that this documentation has been prepared under the direction and in the prescence of Cain Sieve, M.D.  Electronically Signed: Clent Demark, Medical Scribe. 01/31/16 4:36 PM    I, Cain Sieve, personally performed the services described in this documentation. All medical record entries made by the scribe were at my direction and in my presence. I have reviewed the chart and discharge instructions (if applicable) and agree that the record reflections my personal performance and is accurate and complete.   Cain Sieve, MD  01/31/16 9:13 PM

## 2016-01-31 NOTE — Patient Instructions (Signed)
[facility]  [address]  [place] [address]  Dept Phone: [phone]  Dept Fax: [phone]

## 2016-02-03 ENCOUNTER — Encounter (HOSPITAL_BASED_OUTPATIENT_CLINIC_OR_DEPARTMENT_OTHER): Payer: Self-pay

## 2016-02-03 ENCOUNTER — Emergency Department (HOSPITAL_BASED_OUTPATIENT_CLINIC_OR_DEPARTMENT_OTHER)
Admission: RE | Admit: 2016-02-03 | Disposition: A | Discharge status: Home / Self Care | Payer: Self-pay | Source: Emergency Department | Attending: Emergency Medicine | Admitting: Emergency Medicine

## 2016-02-03 LAB — CBC, PLATELET & DIFFERENTIAL
ABSOLUTE BASO COUNT: 0.1 10*3/uL (ref 0.0–0.1)
ABSOLUTE EOSINOPHIL COUNT: 0.8 10*3/uL (ref 0.0–0.8)
ABSOLUTE IMM GRAN COUNT: 0.01 10*3/uL (ref 0.00–0.03)
ABSOLUTE LYMPH COUNT: 2.4 10*3/uL (ref 0.6–5.9)
ABSOLUTE MONO COUNT: 0.8 10*3/uL (ref 0.2–1.4)
ABSOLUTE NEUTROPHIL COUNT: 5.9 10*3/uL (ref 1.6–8.3)
BASOPHIL %: 0.6 % (ref 0.0–1.2)
EOSINOPHIL %: 7.6 % — ABNORMAL HIGH (ref 0.0–7.0)
HEMATOCRIT: 40.1 % (ref 34.1–44.9)
HEMOGLOBIN: 13.3 g/dL (ref 11.2–15.7)
IMMATURE GRANULOCYTE %: 0.1 % (ref 0.0–0.4)
LYMPHOCYTE %: 24.4 % (ref 15.0–54.0)
MEAN CORP HGB CONC: 33.2 g/dL (ref 31.0–37.0)
MEAN CORPUSCULAR HGB: 31.4 pg (ref 26.0–34.0)
MEAN CORPUSCULAR VOL: 94.6 fL (ref 80.0–100.0)
MEAN PLATELET VOLUME: 10.2 fL (ref 8.7–12.5)
MONOCYTE %: 7.9 % (ref 4.0–13.0)
NEUTROPHIL %: 59.4 % (ref 40.0–75.0)
PLATELET COUNT: 248 10*3/uL (ref 150–400)
RBC DISTRIBUTION WIDTH STD DEV: 43.4 fL (ref 35.1–46.3)
RBC DISTRIBUTION WIDTH: 12.9 % (ref 11.5–14.3)
RED BLOOD CELL COUNT: 4.24 M/uL (ref 3.90–5.20)
WHITE BLOOD CELL COUNT: 9.9 10*3/uL (ref 4.0–11.0)

## 2016-02-03 LAB — BASIC METABOLIC PANEL
ANION GAP: 10 mmol/L (ref 5–15)
BUN (UREA NITROGEN): 13 mg/dL (ref 7–18)
CALCIUM: 9.1 mg/dL (ref 8.5–10.1)
CARBON DIOXIDE: 28 mmol/L (ref 21–32)
CHLORIDE: 108 mmol/L — ABNORMAL HIGH (ref 98–107)
CREATININE: 0.8 mg/dL (ref 0.4–1.2)
ESTIMATED GLOMERULAR FILT RATE: >60 mL/min (ref >60)
Glucose Random: 108 mg/dL (ref 74–160)
POTASSIUM: 3.7 mmol/L (ref 3.5–5.1)
SODIUM: 145 mmol/L (ref 136–145)

## 2016-02-03 LAB — MAGNESIUM: MAGNESIUM: 1.8 mg/dL (ref 1.8–2.4)

## 2016-02-03 LAB — TROPONIN I: TROPONIN I: <0.02 ng/mL (ref 0.00–0.04)

## 2016-02-03 LAB — HOLD BLUE TOP TUBE

## 2016-02-03 MED ORDER — FAMOTIDINE 20 MG/2ML IV SOLN
20.00 mg | Freq: Once | INTRAVENOUS | Status: AC
Start: 2016-02-03 — End: 2016-02-03
  Administered 2016-02-03: 20 mg via INTRAVENOUS
  Filled 2016-02-03: qty 2

## 2016-02-03 MED ORDER — KETOROLAC TROMETHAMINE 30 MG/ML INJ
30.0000 mg | Freq: Once | Status: AC
Start: 2016-02-03 — End: 2016-02-03
  Administered 2016-02-03: 30 mg via INTRAVENOUS
  Filled 2016-02-03: qty 1

## 2016-02-03 MED ORDER — ALUMINUM & MAGNESIUM HYDROXIDE 200-200 MG/5ML PO SUSP
30.0000 mL | Freq: Once | ORAL | Status: AC
Start: 2016-02-03 — End: 2016-02-03
  Administered 2016-02-03: 30 mL via ORAL
  Filled 2016-02-03: qty 30

## 2016-02-03 MED ORDER — LIDOCAINE VISCOUS 2 % MT SOLN
10.0000 mL | Freq: Once | OROMUCOSAL | Status: AC
Start: 2016-02-03 — End: 2016-02-03
  Administered 2016-02-03: 10 mL via OROMUCOSAL
  Filled 2016-02-03: qty 15

## 2016-02-03 NOTE — Narrator Note (Signed)
Pt states that after medication, her cp is not as strong, but still is intermittent on left anterior chest

## 2016-02-03 NOTE — Narrator Note (Signed)
Report to Alyssa

## 2016-02-03 NOTE — ED Triage Note (Signed)
Pt with chest pain since this afternoon.

## 2016-02-03 NOTE — ED Provider Notes (Signed)
eMERGENCY dEPARTMENT Physician Assistant NOTE    The ED nursing record was reviewed.   The prior medical records as available electronically through Epic were reviewed.  The mode of arrival was Self on 02/03/2016  8:07 PM.    This patient was seen with Emergency Department attending physician Dr. Threasa Beards.    CHIEF COMPLAINT    Patient presents with:  Chest Pain: CHEST PAIN-ID SEEN      HPI    Bonnie Tucker is a 50 year old female presents the ED with chest pain.  Patient states she's had intermittent pain in her lower left chest since approximately 11 AM.  Patient states that she ate lunch and started feeling better.  Patient reports approximately 1:30 PM she started to feel the pain again while she was driving, this concerned her so she went home and got a ride to the ED.  Patient states the pain is better now but she is still feeling an unsettled feeling in her upper abdomen.   Patient denies fever, back pain, diaphoresis, or N/V/D.    PAST MEDICAL HISTORY      Past Medical History    Functional ovarian cysts     Comment: small, seen on u/s    GERD (gastroesophageal reflux disease)     MIGRAINE NOS 07/24/2006    Comment: Complex migraine - off meds    OBSTRUCTIVE SLEEP APNEA, ADULT & PED 07/24/2006    Comment: Severe obstructive sleep apnea - on CPAP    OVARIAN CYST NEC/NOS 07/24/2006    Comment: Recurrent small simple ovarian cyst - functional    Sleep apnea     Comment: no longer uses CPAP - no longer has sxs of sleep apnea    Wears eyeglasses     Comment: reading glasses       PROBLEM LIST  Patient Active Problem List:     Mirena IUD     Unspecified migraine     Obstructive sleep apnea (adult) (pediatric)     Obesity (BMI 35.0-39.9 without comorbidity)     Hemorrhage of rectum and anus     Diverticula of colon     Excessive or frequent menstruation     Vitamin D deficiency      SURGICAL HISTORY      Past Surgical History    NO SIGNIFICANT SURGICAL HISTORY         CURRENT MEDICATIONS      Current  Facility-Administered Medications:     ibuprofen (ADVIL,MOTRIN) tablet 800 mg, 800 mg, Oral, Once, David Stall    Current Outpatient Prescriptions:     diclofenac (VOLTAREN) 1 % GEL Gel, Apply 2 g topically 2 (two) times daily Do Not Exceed 32 g in 24 Hours, Disp: 100 g, Rfl: 1    Cholecalciferol (VITAMIN D3) 1000 UNITS TABS Tablet, Take 1 tablet by mouth daily, Disp: , Rfl:     levonorgestrel (MIRENA) 20 MCG/24HR IUD, 1 Device by Intrauterine route continuous. As Instructed, Disp: 1 Device, Rfl: 0    ALLERGIES    Review of Patient's Allergies indicates:   Nkda [please verify*        FAMILY HISTORY      Family History    Hypertension Mother     OTHER Father     Comment: killed by terrorists [place]    In Good Health Brother     In Good Health Brother     Cancer - Breast FamHxNeg     Asthma Sister  Alcohol/Drug Abuse FamHxNeg     Cancer - Other FamHxNeg     Diabetes FamHxNeg     Heart FamHxNeg     Lipids FamHxNeg     Mental/Emotional Disorders FamHxNeg     Stroke FamHxNeg     Pulmonary FamHxNeg     Renal FamHxNeg     TB FamHxNeg     Thyroid FamHxNeg     In Good Health Mother        SOCIAL HISTORY    Social History    Marital status: Married             Spouse name:                       Years of education:                 Number of children:               Social History Main Topics    Smoking status: Never Smoker                                                                Smokeless status: Never Used                        Alcohol use: No              Drug use: No              Sexual activity: Yes               Partners with: Female       Birth control/protection: IUD       Comment: paraguard 2000    Other Topics            Concern  Blood Transfusions      No  Seat Belt               Yes    Social History Narrative    She is a Engineer, water, 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     Says it's not too stressful.     Married.     Has two adult children.         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    Living with husband.    Children are out of the house.        REVIEW OF SYSTEMS     The pertinent positives are reviewed in the HPI above. All other systems were reviewed and are negative.    PHYSICAL EXAM      Vital Signs: BP 103/67  Pulse 79  Temp 98 F  Resp 16  Wt 89.8 kg (198 lb)  LMP 01/26/2016  SpO2 98%  BMI 37.41 kg/m2   Pain Score: Pain Score: 6 (6/10)  Constitutional: Well-developed, Well-nourished, Non-toxic appearance. Speaking full sentences.    Distress: NAD  HEAD: NCAT, Without signs of trauma.    NECK: No C-spine tenderness; No tenderness, swelling, or step-off. Full range of motion without discomfort. Supple with no meningismus.   EYES: Pupils are equal and reactive. Extraocular movements are intact. No scleral icterus.  ENT: Clear. Mucus membranes are moist.   LYMPHATICS: No palpable cervical lymphadenopathy.   CV: RRR, No MRG, radial pulses 2+ B/L  PULMONARY:  CTAB, No WRR, No stridor, accessory muscle use or tripoding  ABDOMINAL: Soft, NTND, No rebound, guarding or masses, No murphy's, mcburney's or rovsing's.   GENITOURINARY: No CVA tenderness.   MUSCULOSKELETAL : TTP lower left chest,  Moving all 4 extremities. Ambulatory w/ a steady gait. The spine straight and nontender.   SKIN: Warm and dry, no rash . The skin color and turgor are normal.   NEUROLOGIC: Normal mental status. Cranial nerves, motor, sensor, DTRs, and cerebellum are grossly intact.   PSYCHIATRIC: Normal affect      RESULTS  No results found for this visit on 02/03/16 (from the past 24 hour(s)).     RADIOLOGY    [facility] [address], [place] [address] [phone] Department of Diagnostic Radiology PATIENT: Hurlock,Andreana ESTER DOB: [birth year] AGE: 67 SEX: F ACCT#: [id number] LOCATION: [facility] UNIT#: [id number] STATUS: DEP ER ORD PHY: Laurance Flatten PA-C Exam Date: 02/03/16 Exam Status: Signed Exam: CHEST TWO VIEWS Reason for Exam:  chest pain Exam: Chest, 2 views Indication: Chest pain Findings: Comparison is made to a previous study of 07/09/2012. There is a calcified granuloma in the left midlung field, unchanged. No acute infiltrates are seen. The heart is normal in size. The mediastinum appears normal. The bony thorax appears normal. Impression: No active pulmonary infiltrates and no change from 07/09/2012. Dictated By: Merleen Nicely MD Reviewed and Electronically Signed By: Merleen Nicely, MD Technol  ogist: [email or web address] Signed Date/Time: 02/04/16 1046 Transcribed Date/Time: 02/04/16 1045 by [facility] Printed Date/Time: Report #: [id number] Addendum Transcribed Date/Time: / Addendum Signed Date/Time CC: Drucie Opitz, MD    EKG:  74 bpm, NSR, normal ECG with no sign of ischemic changes      MEDICATIONS ADMINISTERED ON THIS VISIT    Medication Orders Placed This Encounter      lidocaine (XYLOCAINE) 2 % viscous solution 10 mL      aluminum-magnesium hydroxide (MAALOX) 200-200 mg/5 mL suspension 30 mL      famotidine (PEPCID) injection 20 mg      ketorolac (TORADOL) injection 30 mg    ED COURSE & MEDICAL DECISION MAKING      I reviewed the patient's past medical history/problem list, past surgical history, medication list, social history and allergies.    Arrival: Pt arrived in stable condition and required no immediate interventions.    ED Decision Making & Course: Pt is a 50 year old female with chest pain.   Patient appears well on physical exam and has stable vital signs.    The patient's symptoms and physical exam is unlikely this pain is caused by ACS, more likely GERD/acid reflux or musculoskeletal pain.  ECG at triage showed normal sinus rhythm without ischemic changes.  Chest x-ray, labs including troponin were ordered.  Patient was given GI cocktail with Pepcid and Toradol for symptom relief.  X-ray returned unremarkable.   Labs returned unremarkable and troponin was flat.  On reassessment patient states she was feeling better but  still had some minor pain to left lower ribs.    Plan will be second troponin and if that is negative patient will be sent home with PCP follow-up.  Patient was awaiting second troponin at the end of my shift.  Pt remained hemodynamically stable during their stay in the emergency department.     Diagnosis: Chest  pain, unspecified    Condition: Stable  Disposition: Pending    Laurance Flatten, PA-C      Patient signed out to Dr. Threasa Beards 02/03/2016 at [phone]

## 2016-02-03 NOTE — ED Notes (Signed)
Bed: 01  Expected date:   Expected time:   Means of arrival:   Comments:  HK

## 2016-02-03 NOTE — Narrator Note (Signed)
Pt to xray on a stretcher

## 2016-02-04 LAB — XR CHEST 2 VIEWS

## 2016-02-04 LAB — TROPONIN I: TROPONIN I: <0.02 ng/mL (ref 0.00–0.04)

## 2016-02-04 NOTE — Narrator Note (Signed)
Patient Disposition    Patient education for diagnosis and follow-up.  Patient left ED 2:03 AM.  Patient rep received written instructions.  Interpreter to provide instructions: No    Patient belongings with patient: YES    Have all existing LDAs been addressed? Yes    Have all IV infusions been stopped? N/A    Discharged to: Discharged to home. Pt expressed understanding of discharge material, including instructions and applicable medications. Pt expressed understanding of follow up instructions, as well as signs/symptoms to return to ED. Pt ambulatory out of ED w/steady gait.

## 2016-02-04 NOTE — Narrator Note (Signed)
Assumed care of pt from [facility]. Pt denies needs at this time, will await orders for repeat troponin.

## 2016-02-04 NOTE — Discharge Instructions (Signed)
You were seen for chest pain    We did labs, we checked your EKG, we checked a x-ray    Your chest wall was tender on palpation, the most likely cause of your pain is musculoskeletal.    You can take Motrin 600 milligrams, every 8 hours as needed for pain, and Tylenol 650 milligrams every 6 hours as needed for pain.    Based on our evaluation, we do not think your pain is caused by the heart,    However you should come back to the emergency department for urgent evaluation if you have    Worsening pain, coughing up blood, shortness of breath, sharp pain that shoots the back, or any other new or concerning symptoms    Please follow-up with her primary care doctor to see if any further workup will be needed.

## 2016-02-05 ENCOUNTER — Telehealth (HOSPITAL_BASED_OUTPATIENT_CLINIC_OR_DEPARTMENT_OTHER): Payer: Self-pay

## 2016-02-05 NOTE — Progress Notes (Signed)
ED f/u letter sent

## 2016-02-08 LAB — EKG

## 2016-03-13 ENCOUNTER — Ambulatory Visit (HOSPITAL_BASED_OUTPATIENT_CLINIC_OR_DEPARTMENT_OTHER): Payer: PRIVATE HEALTH INSURANCE | Admitting: Family Medicine

## 2016-04-01 ENCOUNTER — Ambulatory Visit

## 2016-04-01 NOTE — Progress Notes (Signed)
Immunization History:  Historical Source: Historical information - from other provider   H1N1 Influenza: H1N1-09  #1 Historical (02/03/2009)   Td: Td (adult) - Unspecified Formulation  #1 Historical (01/05/2007)   Tdap: Tdap - Unspecified Formulation  #1 Historical (01/05/2008)   Hepatitis A: Twinrix (HepA-HepB)  #1 Historical (11/28/2015)   Typhoid: Typhoid - Unspecified Formulation  #1 Historical (11/28/2015)

## 2016-04-02 ENCOUNTER — Ambulatory Visit

## 2016-06-03 ENCOUNTER — Ambulatory Visit

## 2016-06-03 NOTE — Progress Notes (Signed)
Visit Type:  Acute Visit  Primary Provider:  Princella Pellegrini. Elisabeth Most MD      History of Present Illness:  Kelsey Pace is a 50 Year Old Female who presents today for left swollen eye     Specialists seen since last visit?  Has previsit planning and a huddle been performed on this patient? No, add on     L eye, inner lower lid edema  onset 2 days ago  pt thinks it could be from a makeup party she had recently for her birthday  c/o pain located in the L cheek but denies pain in the L lower eyelid  c/o lacrimation from the L eye  denies pus  has been using warm compresses but reports no improvement         Past Medical History  No hx of mental illness  functional ovarian cysts, recurrent small simple cysts, on u/s  GERD, vitamin D deficiency  Surgical History  denies  Family History  mother - HTN  father - killed at age 74 by terrorists [place]  Sister and brother with asthma  Mat GM - passed away from asthma attack    Denies DM, breast cancer, colon cancer, heart disease  Social History  born [place]  Lives with husband  Has 3 children - one lives in [place], one in [place], one in [place]  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 Denies discharge. +lacrimation, eyelid swelling    All other pertinent systems were reviewed and are negative     Vital Signs     Weight: 209 lb. Height: 62.5  in.    BMI: 37.75  BSA:    2    Wt chg: 0  Weight: 209 lbs   BMI: 37.75 on 02/29/2016  Temperature: 98.49 deg F.     Temp Site: oral    Pulse rate: 74  BP: 122/84 - large cuff, left arm      Patient is experiencing Pain  Location: Left eye     Intensity: 6    Type: sharp   Duration: constant    Medications and Allergies Reviewed    Signed: Christina Paul,[place]....June 03, 2016  2:39 PM  PHQ 2    Over the last 2 weeks, how often have you been bothered by any of the following problems?  1. Little interest or pleasure in doing things:  0   - Not at all  2. Feeling down, depressed, or hopeless:  0   - Not at all        Physical Exam    General:      well developed, well nourished, in no acute distress.  central body obesity.   Head:      R zygomatic arch - tenderness to palpation, fullness without overlying erythema, warmth    Eyes:      L lower lid at nasolacrimal area - redness, focally swollen  L inner sclera - 2mm nodule, sclera injected  scant drainage from nasolacrimal duct  Full EOM  Psych:      alert and cooperative; normal mood and  affect; normal attention span and concentration.          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  Take with food.  MIRENA (52 MG) 20 MCG/24HR IUD (LEVONORGESTREL):   ERYTHROMYCIN 5 MG/GM OINT: 1 inch to eye-lid 3 times a day for 5 days  KEFLEX 500 MG CAPS (CEPHALEXIN): one tab PO 6 hours x 7 days  Orders:   Added new Service order of Patient Encounter ([id number]) - Signed  Added new Service order of OV Est Level III ([id number]) - Signed       Assessment and Plan:      ~ HORDEOLUM EXTERNUM, LEFT (H00.016):  irritated, continue conservative measures with compresses, add topical abx given minimal rsponse at this time.  if develops cellulitis around affected area, initiate PO keflex and refer to optho for consideration of I&D    Problems Reviewed  Med Compliance and SE's: Pt is compliant with meds with no side effects   Patient/Caregiver understand instructions and plan.    New/Revised  Medications Today:   ERYTHROMYCIN 5 MG/GM OINT (ERYTHROMYCIN) 1 inch to eye-lid 3 times a day for 5 days  KEFLEX 500 MG CAPS (CEPHALEXIN) one  tab PO 6 hours x 7 days        Patient Instructions    Continue warm compresses applied to cheek and eye area.  For pain/discomfort, use ibuprofen.  Avoid makeup while you have symptoms.  Begin antibiotic eye ointment.  If in 48 hours your symptoms are worsening call us and begin the oral antibiotics and we will refer you to an eye specialist.    Prescriptions:  KEFLEX 500 MG CAPS (CEPHALEXIN) one tab PO 6 hours x 7 days  #28 Undefined x 0   Entered and Authorized by: Denyse Amass MD   Signed by: Denyse Amass MD on 06/03/2016   Method used: Print then Give to Patient   RxID: [id number]  ERYTHROMYCIN 5 MG/GM OINT (ERYTHROMYCIN) 1 inch to eye-lid 3 times a day for 5 days  #1 x 1   Entered and Authorized by: Denyse Amass MD   Signed by: Denyse Amass MD on 06/03/2016   Method used: Electronically to      [facility] (retail)     [address]     [place], [place]  [address]     Ph: [phone]     Fax: [phone]   RxID: [id number]          By signing my name below, I, Juanetta Gosling, attest that this documentation has been prepared under the direction and in the presence of Denyse Amass MD.   Electronically Signed: (June 03, 2016 3:10 PM)

## 2016-06-03 NOTE — Telephone Encounter (Signed)
Phone Note -       ***URGENT***    Initial call taken by: Derry Skill,  June 03, 2016 12:03 PM  Initial Details of Call:  Pt, Kelsey Pace, states that she has had an inflammation in her eye all weekend and nothing that she has tried has helped. Pt would like to come in today for an appointment. I attempted to transfer to office but was on hold 4+ minutes. Please call Kelsey Pace back to schedule an appointment. Thank you.       Follow-up #1  Details: How long have you had the symptoms: WKEND  Which eye: LEFT  redness/ swelling: NOT RED, SWOLLEN  Any pain or just uncomfortable: FRI USED IBUPROFEN, WASHED EYE WITH WARM WATER  pain scale 1-10: 7  weepy/discharge/crusty: NO  Itchy: INSIDE EYE AT 3A, LESS ITCHY NOW  Fevers(temp): UNSURE  Foreign body- Chemical-what kind? Sand? NO Punched/fight: NO  Blurred vision: LITTLE BIT  Do you have eye doctor: UNSURE, INSURANCE CHANGE    Appt booked for 2:30. States no longer has BC, now has [facility] PPO, unable to give ID #, agrees to call [place] & return call  By: Tomie China ~ June 03, 2016 12:40 PM

## 2016-07-04 ENCOUNTER — Ambulatory Visit

## 2016-07-04 ENCOUNTER — Ambulatory Visit: Admitting: Family Medicine

## 2016-07-04 NOTE — Progress Notes (Signed)
Primary Provider:  Princella Pellegrini. Elisabeth Most MD      History of Present Illness:  Kelsey Pace is a 50 Year Old Female who presents today for: Cough    Started over 1 week afo  Coughing up green mucus  SOB in the mornings and when active - climbing stairs  Headaches but no fevers  Taking motrin and ibuprofen which help  Congested  Throat hurt first three days but not anymore  Slight right ear pain  No nausea  no fever  Decreased appetite    tried ginger, tea, and honey- not helpful  she stayed overnight in hospital, her daugther had baby    Specialists seen since last visit? no  Has previsit planning and a huddle been performed on this patient? no      Current Problems- Reviewed during today's visit  COUGH  HORDEOLUM EXTERNUM, LEFT  SLEEP APNEA, OBSTRUCTIVE, SEVERE  IRREGULAR MENSTRUATION  HEADACHE, CHRONIC  OBESITY, BMI 35-39.9, ADULT    Current Medications- Reviewed during today's visit  IBUPROFEN 600 MG TABS: 1 by mouth up to 3 times daily as needed for pain.  Take with food.  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   Respiratory: Complains of shortness of breath.   Skin: Denies rash.     Vital Signs     Weight: 209 lb. Height: 62.5  in.    BMI: 37.75  BSA:    2    Wt chg: 0  Weight: 209 lbs   BMI: 37.75 on 06/03/2016  Temperature: 98.2 deg F.      Temp Site: oral    Pulse rate: 88    Pulse rhythm: regular  Pulse Ox (SpO2): 98  BP: 140/92 - normal cuff, left arm      Patient is experiencing Pain  Location: head    Intensity: 5    Type: pressure   Duration: constant    Medications and Allergies Reviewed    Signed: Lazaro Arms, [place]....July 04, 2016 5:37 PM          Physical Exam    General:      well developed, well nourished, in no acute distress.    Head:      normocephalic and atraumatic.    Eyes:      normal conjunctiva  Mouth:      MMM  Lungs:      LEFT LUNG: soft crackles in anterior lung field  exp wheeze anterior and posterior    RIGHT:  no rhales  no rhonchi  few intermittent wheeze but not as pronounced as on left  Heart:       regular rate  and rhythm, S1, S2 without murmurs  Extremities:      no edema  Neurologic:      no focal deficits,   Skin:      no rash  Psych:      alert and cooperative; normal mood and affect; normal attention span and concentration.           Assessment and Plan:     Kelsey Pace is a 50 Years Old Female who presents today for cough and SOB     ~ COUGH (R05):    concerned for developing left sided PNA  rx for AZITHROMYCIN 250 MG TABS: 2 tabs PO today then 1 tab PO daily for 4 days  rx for  PROAIR HFA 108 (90 BASE) MCG/ACT AERS (ALBUTEROL SULFATE): 2 puffs inhaled every 4 hrs as needed for SOB/wheeze.   call if no improvement in 48hrs or worsnes      Medications Removed Today:   KEFLEX 500 MG CAPS (CEPHALEXIN) one tab PO 6 hours x 7 days    New/Revised  Medications Today:   AZITHROMYCIN 250 MG TABS (AZITHROMYCIN) 2 tabs PO today then 1 tab PO daily for 4 days  PROAIR HFA 108 (90 BASE) MCG/ACT AERS (ALBUTEROL SULFATE) 2 puffs inhaled every 4 hrs as needed for SOB/wheeze        Prescriptions:  PROAIR HFA 108 (90 BASE) MCG/ACT AERS (ALBUTEROL SULFATE) 2 puffs inhaled every 4 hrs as needed for SOB/wheeze  #1[Inhaler] x 0   Entered and Authorized by: Cheryln Manly MD   Signed by: Cheryln Manly MD on 07/04/2016   Method  used: Electronically to      [facility] (retail)     [address]     [place], [place]  [address]     Ph: [phone]     Fax: [phone]   RxID: [id number]  AZITHROMYCIN 250 MG TABS (AZITHROMYCIN) 2 tabs PO today then 1 tab PO daily for 4 days  #6 x 0   Entered and Authorized by: Cheryln Manly MD   Signed by: Cheryln Manly MD on 07/04/2016   Method used: Electronically to      [facility] (retail)     [address]     [place], [place]  [address]     Ph: [phone]     Fax: [phone]   RxID: [id number]

## 2016-07-04 NOTE — Telephone Encounter (Signed)
Phone Note -       Initial call taken by: Meredith Leeds,  July 04, 2016 2:15 PM  Initial Details of Call:  pt calling stating that she has had a cough for more then a week now. appt scheduled 7/6 at 5:30 with Dr. Loletha Grayer     When did it start: LAST WEEK   dry or productive(give color): PRODUCTIVE, GREEN MUCUS   Wheezing/ SOB: SOB WHEN ACTIVE   Fevers(with temp)/chills: NO  chest congestion: YES   head pain: YES   nose congested: YES   sore throat: NO   ear pain: A LITTLE BIT   body aches: NO   inhalers: NO   nausea/vomiting/diarrhea: NO

## 2016-07-11 ENCOUNTER — Ambulatory Visit

## 2016-07-11 ENCOUNTER — Ambulatory Visit: Admitting: Medical

## 2016-07-11 LAB — HX CBC W/DIFFX
HX ABSOLUTE BASOPHILS COUNT: 0.13 10*3/uL (ref 0.0–0.33)
HX ABSOLUTE EOSINOPHIL COUNT: 0.66 10*3/uL (ref 0.0–0.88)
HX ABSOLUTE LYMPHS COUNT: 2.39 10*3/uL (ref 0.99–4.84)
HX ABSOLUTE MONOCYTES COUNT: 0.78 10*3/uL (ref 0.18–1.21)
HX ABSOLUTE NEUTROPHIL CNT: 4.98 10*3/uL (ref 1.8–7.7)
HX BASOS: 1.4 %
HX EOS: 7.3 %
HX HEMATOCRIT: 39.2 % (ref 36.0–46.0)
HX HEMOGLOBIN: 13.2 g/dL (ref 12.0–16.0)
HX IMMATURE GRANULOCYTES: 0.4 % (ref 0.0–1.0)
HX LYMPHS: 26.6 %
HX MEAN CORP.HEMO.CONC.: 33.7 g/dL (ref 31.0–37.0)
HX MEAN CORPUSCULAR HEMOGLOBIN: 31.7 pg (ref 26.0–34.0)
HX MEAN CORPUSCULAR VOLUME: 94 fL (ref 80.0–100.0)
HX MEAN PLATELET VOLUME: 9.3 fL — ABNORMAL LOW (ref 9.4–12.4)
HX MONOS: 8.7 %
HX PLATELET COUNT: 252 10*3/uL (ref 150.0–400.0)
HX POLYS: 55.6
HX RED BLOOD COUNT: 4.2 M/uL (ref 4.0–5.2)
HX RED CELL DISTRIBUTION WIDTH SD: 43.4 fL (ref 35.0–51.0)
HX WHITE BLOOD COUNT: 9 10*3/uL (ref 4.5–11.0)

## 2016-07-11 LAB — HX COMPREHENSIVE METABOLIC PANELX
HX ALBUMIN: 4.1 g/dL (ref 3.2–4.9)
HX ALKALINE PHOSPHATASE: 101 U/L (ref 30.0–117.0)
HX ALT: 23 U/L (ref 0.0–31.0)
HX ANION GAP: 10 mmol/L (ref 9.0–19.0)
HX AST: 20 U/L (ref 0.0–31.0)
HX BICARBONATE: 27 mmol/L (ref 22.0–29.0)
HX BUN/CREAT RATIO: 15 (ref 12.0–20.0)
HX BUN: 12 mg/dL (ref 6.0–20.0)
HX CALCIUM: 9.4 mg/dL (ref 8.5–10.5)
HX CHLORIDE: 104 mmol/L (ref 98.0–110.0)
HX CREATININE: 0.8 mg/dL (ref 0.4–1.2)
HX GLOMERULAR FILTRATION RATE: 76
HX GLUCOSE: 94 mg/dL (ref 70.0–100.0)
HX HEMOLYSIS INDEX: 4 mg/dL (ref 0.0–50.0)
HX ICTERIC INDEX: 1 (ref 0.0–2.0)
HX LIPEMIC INDEX: 25 mg/dL (ref 0.0–40.0)
HX POTASSIUM: 3.7 mmol/L (ref 3.6–5.3)
HX SODIUM: 141 mmol/L (ref 137.0–146.0)
HX TOTAL BILIRUBIN: 0.3 mg/dL (ref 0.2–1.2)
HX TOTAL PROTEIN: 6.9 g/dL (ref 6.5–8.4)

## 2016-07-11 NOTE — Telephone Encounter (Signed)
Phone Note -       Initial call taken by: Kalman Drape RN,  July 11, 2016 11:51 AM  Initial Details of Call:  The patient's daughter, Kelsey Pace (patient gave verbal permission to speak to her), . The patient was seen on 07/04/16 for a cough and given some medication.  Kelsey Pace is wanting to discuss her mother's continuing  coughing issues as she is still coughing almost continuously and having a hard time speaking.  The ocugh medicine had increased her blood pressure when she was taking it.  call her back  [phone] . Appt scheduled with Dr. Loletha Grayer for today @ 2:45 PM.          When did it start: X ! MONTH  dry or productive(give color): DRY    Wheezing/ SOB: SOMETIMES    Fevers(with temp)/chills: DENIES    chest congestion: DENIES  head pain: YES    nose congested: DENIES  sore throat: YES    ear pain: DENIES    ear blocked: DENIES    inhalers: PROAR  nausea/vomiting/diarrhea DENIES  Hx of asthma/allergies/COPD: DENIES  On any ace inhibitors (lisinopril):  DENIES  improve with OTC meds/remedy:     overall:   better, worse, no change NO CHANGE        Follow-up #1  Details: can she got o [facility] or [facility] beofre I see here for CXR?  CXR ordered  By: Cheryln Manly MD ~ July 11, 2016 12:42 PM    Details: When daughter Kelsey Pace and I spoke this AM she said that her Mom was going to go to the ED. I told her we she did not have to go threre, wecould see her here. Evidently,Mom went anyway and is @ [facility]. They kept Appt with you thinking thaty she could follow-Up here. I told her I would cancel appt here since Mom is already being seen. Informed her she can call for Follow-Up as needed Post ED.  By: Kalman Drape RN ~ July 11, 2016 1:22 PM    Follow-up #2  Details: pt being seen in ED  By: Cheryln Manly MD ~ July 11, 2016 1:32 PM        Orders:  Added new Test order of XR - Chest (XR-CHST) - Signed

## 2016-11-15 ENCOUNTER — Ambulatory Visit

## 2016-11-15 NOTE — Progress Notes (Signed)
Clinical Lists Changes - Est pt CPE   Did you self refer to any specialists?    Age: 50 Years Old  Last Secure Message:    Last PIN change:           Labs   No eye exam in record!  No LDL on record !  HGBA1C =       Most Recent Visits     (Last rx/phone contact: 07/11/2016 )        02/29/2016  Letitia Sabala D. Elisabeth Most MD (.HPI)    07/04/2016  Cheryln Manly MD (.HPI)    06/03/2016  Denyse Amass MD (.HPI)                                      Next Appointment(s) 11/18/2016  [facility]  4:45 PM      Immunizations   No flu vaccine this season     Hepatitis B  11/28/2015      Hepatitis A  11/28/2015      Td Booster 01/05/2007  TDAP 10/31/2015   Last Flu  11/02/2015    typhim vaccine   11/28/2015   done       Mammogram:  Date of Exam:  Date of Exam: .   PAP: Date of Exam: .   Bone Density:  Date of Exam:    Colonoscopy: normal -rpt 10 yrs Date of Exam:  12/31/2011.  FIT Test:  Date:    Hemoccult:  Date:  .    Protocols Due  FLU VAX, PAP SMEAR, MAMMOGRAM, CHOLESTEROL.       FLU  pap today  please offer breast and skin exam. if she wants this, have her change into gown, bra off, opening in the back. thanks!    js- mammo, fasting labs. colo due 12/2021    Directives   The above represents the pre-visit preparations conducted for this patient.    flu  pap          Problems:  Added new problem of SCREENING FOR BREAST CANCER (ICD-V76.10) (ZOX09-U04.9)  Added new problem of SCREENING, PAP SMEAR, LOW RISK (ICD-V76.2) (ICD10-Z12.4)  Added new problem of PREVENTIVE HEALTH CARE (ICD-V70.0) (ICD10-Z00.00)  Orders:  Added new Test order of Mammogram-Screening ** (MAMDIGSC) - Signed  Added new Test order of PAP/HPV (231) - Signed  Added new Test order of HDL -Cholesterol (HDL ) - Signed  Added new Test order of LDLD- LDL Direct  (LDLD) - Signed  Added new Test order of CHOL -Cholesterol**serum total (CHOL) - Signed  Added new Test order of COMMP -Comp. Metabolic Panel (CMP) - Signed

## 2016-11-18 ENCOUNTER — Ambulatory Visit

## 2016-11-18 ENCOUNTER — Ambulatory Visit: Admitting: Family Medicine

## 2016-11-18 NOTE — Progress Notes (Signed)
FLU  pap today  please offer breast and skin exam. if she wants this, have her change into gown, bra off, opening in the back. thanks!    js- mammo, fasting labs. colo due 12/2021    Primary Provider:  Princella Pellegrini. Elisabeth Most MD      History of Present Illness:  Kelsey Pace is a 50 Year Old Female who presents today for: Est pt CPE   Specialists seen since last visit?  Has previsit planning and a huddle been performed on this patient? yes     cc: physical    Diet & exercise: started weight watchers, met with nutriitionist at work twice.   - exercising: walks and machine at home, 2 days/week  - reducing sugar and bread  Dental care: up to date  Eye care: up to date  age-related screening: had mammogram in the last year, up to date with colonoscopy, no hx abnormal pap  GYN/Contraception: LMP 3 weeks ago, irregular, no menopausal symptoms. mirena IUD since 2014    rash - noticed on anterior chest, comes and goes  can be itchy  aveeno cream helps a little  unsure if related to the weather    ROS:  patient denies any significant weight loss, headaches, visual changes, chest pains, palpitations, shortness of beath,  abdominal pain, blood in stool, change in bowels,   unusual joint or muscle pains, or suspicious skin lesions  All other pertinent systems reviewed and are negative.            Current Problems- Reviewed during today's visit  GYNECOLOGICAL EXAMINATION ROUTINE  VACCINE AGAINST INFLUENZA  SKIN RASH  PREVENTIVE HEALTH CARE  SCREENING, PAP SMEAR, LOW RISK  SLEEP APNEA, OBSTRUCTIVE, SEVERE  IRREGULAR MENSTRUATION  OBESITY, BMI 35-39.9, ADULT    Current Medications- Reviewed during today's visit  MIRENA (52 MG) 20 MCG/24HR INTRAUTERINE INTRAUTERINE DEVICE (LEVONORGESTREL):   TRIAMCINOLONE ACETONIDE 0.1 % EXTERNAL CREAM: apply two times daily as needed to  rash  Current Allergies- Reviewed during today's visit  NO KNOWN ALLERGIES  Past Medical History  No hx of mental illness  functional ovarian cysts, recurrent small  simple cysts, on u/s  GERD, vitamin D deficiency  Surgical History  denies  Family History  mother - HTN  father - killed at age 66 by terrorists [place]  Sister and brother with asthma  Mat GM - passed away from asthma attack    Denies DM, breast cancer, colon cancer, heart disease  Social History  born [place]  Lives with husband  Has 3 children - one lives in [place], one in [place], one in [place]  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 Yes      Vital Signs     Weight: 197.40 lb. Height: 62.5  in.    BMI: 35.66  BSA: 1.91    Wt chg:  -12  Weight: 209 lbs   BMI: 37.75 on 07/04/2016  Pulse rate: 84    Pulse rhythm: regular  BP: 136/82 - large cuff, right arm      Patient is not experiencing pain    LMP: 10/28/2016   Medications and Allergies Reviewed    Signed: Sallee Lange [place].Marland KitchenMarland KitchenMarland KitchenNovember 20, 2017 4:49 PM  PHQ 2    Over the last 2 weeks, how often have you been bothered by any of the following problems?  1. Little interest or pleasure in doing things:  0   - Not at all  2. Feeling down, depressed, or hopeless:  0   - Not at all        Vaccines Ordered: Flu IM  Vaccine Counselling by Provider     Vaccines Given  Flu IM     Dose:  0.5 mL  Given By:  Sallee Lange [place]  Manufacturer:  [employer]     Lot Number:  [id number]     Expiration Date:  05/24/2017  Vaccine Type:  Fluarix Quad Preservative Free (>3 yrs.) [CVX150]  NDC Number:  [id number]  VIS Version Provided, Date:  08/05/2014     Source:  Private Purchase  Route:  IM     Site:  Left Deltoid          Physical Exam    Genitalia:      Pelvic  Exam:         External: normal female genitalia without lesions or masses         Vagina: normal without lesions or masses         Cervix: normal without lesions or masses, IUD strings visible         Adnexa: normal bimanual exam without masses or fullness         Uterus: normal by palpation         Pap smear: performed  GEN: well appearing, NAD  HEAD: normocephalic, atraumatic  EYES: PERRL, sclera anicteric  EARS: TMs normal bilaterally  MOUTH: OP clear, MMM  NECK: no lymphadenopathy or thyromegaly, no masses  BREAST: no masses, adenopathy or nipple discharge, no skin changes  CV: RRR, Normal S1, S2, no murmur  PULM: no accessory muscle use, CTAB with good air movement throughout  ABD: normal BS, soft, NT, no organomegaly or masses  EXT: no lower extremity edema  PSYCH: normal mood and affect  SKIN: warm, dry, anterior chest with faintly erythematous slightly rough skin, one 3-4 mm erythematous patch, no suspicious lesions       Assessment and Plan:      ~ PREVENTIVE HEALTH CARE (Z00.00):     ~ OBESITY, BMI 35-39.9, ADULT (E66.9):  Doing well  Immunizations:  flu today  Depression screen: PHQ2 Negative   Counseled about diet, exercise, dental care, seatbelts, sunscreen  Discussed recommended screening based on current USPSTF recommendations Pap/HPV today. mammo up to date per pt - will request records; colonscopy due 2023  Contraceptive management: mirena IUD  due to obesity & age fasting CMP, lipids ordered  Follow up: 1 year for routine CPE       ~ SKIN RASH (R21):  deteriorated. appears inflammatory  rx triamcinolone cream to use two times daily x 1-2 weeks  she will call if not improving with above treatment. in this case re-eval or consider derm referral      Med Compliance and SE's: Pt is compliant with meds with no side effects   Patient/Caregiver understand instructions and plan.    Medications Removed Today:   IBUPROFEN 600 MG  ORAL TABLET (IBUPROFEN) 1 by mouth up to 3 times daily as needed for pain.  Take  with food.  AZITHROMYCIN 250 MG ORAL TABLET (AZITHROMYCIN) 2 tabs PO today then 1 tab PO daily for 4 days  PROAIR HFA 108 (90 BASE) MCG/ACT INHALATION AEROSOL SOLUTION (ALBUTEROL SULFATE) 2 puffs inhaled every 4 hrs as needed for SOB/wheeze    New/Revised  Medications Today:   TRIAMCINOLONE ACETONIDE 0.1 % EXTERNAL CREAM (TRIAMCINOLONE ACETONIDE) apply two times daily as needed to  rash            Patient Instructions    To do:    keep up the great work with healthy eating and exercising.     * Please have fasting labs done at your convenience: nothing to eat or drink for 8-12 hours before EXCEPT water and pills are ok    * Try triamcinolone cream two times daily for your rash. Use for 1-2 weeks and let me know if not improving with this    * please sign a form to request your last mammogram result from [facility]    Follow up: 1 year for routine physical    Prescriptions:  TRIAMCINOLONE ACETONIDE 0.1 % EXTERNAL CREAM (TRIAMCINOLONE ACETONIDE) apply two times daily as needed to  rash  #1[Unspecified] x 1   Entered and Authorized by: Princella Pellegrini. Elisabeth Most MD   Signed by: Princella Pellegrini. Elisabeth Most MD on 11/18/2016   Method used: Electronically to      [id number]* (retail)     [address]     [place], [place]  [address]     Ph: [phone]     Fax: [phone]   RxID: [id number]

## 2016-11-19 LAB — HX HPV WITH REFLEX TO GENOTYPE: HX HPV WITH REFLEX TO GENOTYPE: NEGATIVE

## 2016-11-24 ENCOUNTER — Ambulatory Visit: Admitting: Family Medicine

## 2016-11-26 ENCOUNTER — Ambulatory Visit

## 2016-11-26 NOTE — Progress Notes (Signed)
[facility]  [address]  &  [address]  [place], [place] [address]                    [place], [place] [address]  Tel: [phone]                     Fax: [phone]         November 26, 2016      Kelsey Pace  [address]    [place],  [address]        RE:  TEST RESULTS    Dear Ms. Kelsey Pace:    I have carefully reviewed your most recent test results, and below is a summary of my findings.  Please take note of any instructions provided.      Your pap test was normal and there was no human papilloma virus [HPV] detected. This is the virus that can increase the risk for cervical cancer. We still advise yearly annual exams and of course if you have any pelvic concerns you should call for an appointment.     Please repeat pap smear test in 5 year(s).    Sincerely,      Vicki Mallet, MD  [facility]

## 2016-11-27 ENCOUNTER — Ambulatory Visit

## 2016-11-27 LAB — HX CBC W/DIFFX
HX ABSOLUTE BASOPHILS COUNT: 0.04 10*3/uL (ref 0.0–0.33)
HX ABSOLUTE EOSINOPHIL COUNT: 0.4 10*3/uL (ref 0.0–0.88)
HX ABSOLUTE LYMPHS COUNT: 1.42 10*3/uL (ref 0.99–4.84)
HX ABSOLUTE MONOCYTES COUNT: 0.52 10*3/uL (ref 0.18–1.21)
HX ABSOLUTE NEUTROPHIL CNT: 4.45 10*3/uL (ref 1.8–7.7)
HX BASOS: 0.6 %
HX EOS: 5.8 %
HX HEMATOCRIT: 40.5 % (ref 36.0–46.0)
HX HEMOGLOBIN: 13.7 g/dL (ref 12.0–16.0)
HX IMMATURE GRANULOCYTES: 0.1 % (ref 0.0–1.0)
HX LYMPHS: 20.8 %
HX MEAN CORP.HEMO.CONC.: 33.8 g/dL (ref 31.0–37.0)
HX MEAN CORPUSCULAR HEMOGLOBIN: 32.5 pg (ref 26.0–34.0)
HX MEAN CORPUSCULAR VOLUME: 96.2 fL (ref 80.0–100.0)
HX MEAN PLATELET VOLUME: 10.5 fL (ref 9.4–12.4)
HX MONOS: 7.6 %
HX PLATELET COUNT: 234 10*3/uL (ref 150.0–400.0)
HX POLYS: 65.1
HX RED BLOOD COUNT: 4.2 M/uL (ref 4.0–5.2)
HX RED CELL DISTRIBUTION WIDTH SD: 45 fL (ref 35.0–51.0)
HX WHITE BLOOD COUNT: 6.8 10*3/uL (ref 4.5–11.0)

## 2016-11-27 LAB — HX COMPREHENSIVE METABOLIC PANELX
HX ALBUMIN: 4.1 g/dL (ref 3.2–4.9)
HX ALKALINE PHOSPHATASE: 74 U/L (ref 30.0–117.0)
HX ALT: 17 U/L (ref 0.0–31.0)
HX ANION GAP: 11 mmol/L (ref 9.0–19.0)
HX AST: 18 U/L (ref 0.0–31.0)
HX BICARBONATE: 27 mmol/L (ref 22.0–29.0)
HX BUN/CREAT RATIO: 16 (ref 12.0–20.0)
HX BUN: 12 mg/dL (ref 6.0–20.0)
HX CALCIUM: 9 mg/dL (ref 8.5–10.5)
HX CHLORIDE: 105 mmol/L (ref 98.0–110.0)
HX CREATININE: 0.7 mg/dL (ref 0.4–1.2)
HX GLOMERULAR FILTRATION RATE: 89
HX GLUCOSE: 74 mg/dL (ref 70.0–100.0)
HX HEMOLYSIS INDEX: 1 mg/dL (ref 0.0–50.0)
HX ICTERIC INDEX: 1 (ref 0.0–2.0)
HX LIPEMIC INDEX: 16 mg/dL (ref 0.0–40.0)
HX POTASSIUM: 4.4 mmol/L (ref 3.6–5.3)
HX SODIUM: 143 mmol/L (ref 137.0–146.0)
HX TOTAL BILIRUBIN: 0.4 mg/dL (ref 0.2–1.2)
HX TOTAL PROTEIN: 6.5 g/dL (ref 6.5–8.4)

## 2016-11-27 LAB — HX LIPID PANEL FASTINGX
HX CHD RISK ASSESMENT FACTORX: 4.7
HX CHOLESTEROL (LIPR): 165 mg/dL (ref <200)
HX HDL CHOLESTEROLX: 35 mg/dL — ABNORMAL LOW (ref 45.0–65.0)
HX LDL CHOLESTEROLX: 88 mg/dL (ref <130)
HX NON HDL CHOLESTEROLX: 130 mg/dL (ref <130)
HX TRIGLYCERIDES: 208 mg/dL — ABNORMAL HIGH (ref <150)

## 2016-11-27 LAB — HX TSH WITH REFLEX: HX TSH WITH REFLEX: 2.34 u[IU]/mL (ref 0.27–4.2)

## 2016-11-27 NOTE — Progress Notes (Signed)
[facility]  [address]  &  [address]  [place], [place] [address]                    [place], [place] [address]  Tel: [phone]                     Fax: [phone]           November 27, 2016      Regarding Patient: Kelsey Pace  DOB: [birth year]    [name] was last seen in our office on: November 27, 2016.  Able to Return on: 11/29/2016    With any questions or concerns, please feel free to contact the office.    Sincerely,     Kennis Carina, M.D.

## 2016-11-27 NOTE — Telephone Encounter (Signed)
Phone Note -       Initial call taken by: Lacretia Leigh,  November 27, 2016 10:43 AM  Initial Details of Call:  Patient Kelsey Pace called in, patient is requesting a call back to schedule an appt for a bad cough. Please call patient at [phone]       Follow-up #1  Details: PT HAS 2 CHARTS- had a new pt appt with Dr Elisabeth Most on 11/18/16. Sending  merge request    When did it start: SUNDAY  dry or productive(give color):DRY  Wheezing/ SOB: SOB  Fevers(with temp)/chills FELT HOT YESTERDAY  chest congestion: YES  head pain: BAD H/A YESTERDAY-ADVIL HELPED  nose congested: YES  sore throat: NO  ear pain: RIGHT  body aches: YES  inhalers: HAD 1 IN PAST  nausea/vomiting/diarrhea NO  Hx of asthma/allergies/COPD: NO  new meds: NO  Smoking hx: NO  improve with OTC meds/remedy: ROBITUSSIN-NO HELP  overall: better, worse, no change WORSE    Appt booked with Dr Conception Oms    By: Tomie China ~ November 27, 2016 11:06 AM

## 2016-11-27 NOTE — Progress Notes (Signed)
Visit Type:  Acute Visit  Primary Provider:  Princella Pellegrini. Elisabeth Most MD    CC:  Cough/urine incontience .    History of Present Illness:  Kelsey Pace is a 50 Years Old Female who presents today     -Cough   c/o dry cough that started sunday.  SOB and head pressure when coughing  bad chest congestion  mild tightness with breathing  no fevers  no sore throat  no ear pain  no vomiting  no diarrhea  tried Robitussin, Tea, Ginger Ale  never been diagnosed with asthma  never smoked  pt seen over the summer with cough/wheezing and was treated with Proair - wonders if she needs refill of this     -Urine incontience  pt states when she coughed the other day she has associated leakage of urine  pt never had that before   she does have hx of vaginal delivery   pt has noted a slight increase in urinary frequency but also has been drinking more water    Remainder of 10 point ROS discussed and negative     Do you self refer to any specialists? no  Has previsit planning and a huddle been performed on this patient? no SDS        Current Problems- Reviewed during today's visit  URINARY INCONTINENCE  COUGH  VACCINE AGAINST INFLUENZA  SKIN RASH  PREVENTIVE HEALTH CARE  SCREENING, PAP SMEAR, LOW RISK  SLEEP APNEA, OBSTRUCTIVE, SEVERE  IRREGULAR MENSTRUATION  OBESITY, BMI 35-39.9, ADULT    Current Medications- Reviewed during today's visit  MIRENA (52 MG) 20 MCG/24HR INTRAUTERINE INTRAUTERINE DEVICE (LEVONORGESTREL):   TRIAMCINOLONE ACETONIDE 0.1 % EXTERNAL CREAM: apply two times daily as needed to  rash  BENZONATATE 100 MG ORAL CAPSULE: 1 cap three times daily as needed for cough  PROAIR HFA 108 (90 BASE) MCG/ACT INHALATION AEROSOL SOLUTION (ALBUTEROL SULFATE): 2 puffs every 4 hours as needed for wheezing  MUCINEX 600 MG ORAL TABLET EXTENDED RELEASE 12 HOUR (GUAIFENESIN): 1 by mouth two times daily for cough/congestion  Current Allergies- Reviewed during today's visit  NO KNOWN ALLERGIES            Vital Signs     Weight: 199 lb.  Height: 62.5  in.    BMI: 35.95  BSA:    2    Wt chg:    2  Weight: 197.40 lbs   BMI: 35.66 on 11/18/2016  Temperature: 98.71 deg F.     Temp Site: oral    Pulse rate: 88    Pulse rhythm: regular  On Oxygen? No  Pulse Ox (SpO2): 98 BP: 132/90 - large cuff, left arm      Patient is not experiencing pain    Medications and Allergies Reviewed    Signed: Lady Saucier.Marland KitchenMarland KitchenMarland KitchenNovember 29, 2017 12:55 PM  PHQ 2    Over the last 2 weeks, how often have you been bothered by any of the following problems?  1. Little interest or pleasure in doing things:  0   - Not at all  2. Feeling down, depressed, or hopeless:  0   - Not at all    Laboratory Data    Urinalysis Dipstick   Specific Gravity 1.030  Glucose negative  PH 6  Ketones negative  Leukocytes negative  Urobili normal  Nitrate negative  Bilirubin negative  Protein negative  Blood negative   New Orders:  Patient Encounter [[id number]]  Pulse Ox [CPT-94760]  CBCD -CBC with Diff** [  CBCD]  COMMP -Comp. Metabolic Panel [CMP]  LIPID -Lipid Panel** [LIPR]  TSHR -TSHR with Reflex** [TSHR]  UC - Urine Culture** [UC]  UAWM -Urine w Microscopic [UAWM]  Urine Dip [CPT-81002]  Venipuncture [CPT-36415]  OV Est Level IV [ZOX-09604]  Labs Performed by:  Florian Buff Denmark on November 27, 2016 1:45 PM        Physical Exam    General:      well developed, well nourished, in no acute distress.    Head:      normocephalic and atraumatic.    Eyes:      EOMI  Mouth:      Moist mucous membranes  Lungs:      Occasional coarse cough  Clear to auscultation bilaterally  Heart:      RRR, no murmur  Msk:      Normal movement of all extremities, upper and lower   Psych:      alert and cooperative; normal mood and affect; normal attention span and concentration.           Assessment and Plan:      ~ COUGH (R05): New  Pt c/o dry cough   Lungs were clear on exam   Advised to supportive care as per pt instructions      ~ URINARY INCONTINENCE (R32):  New  Pt c/o urinary incontinence  Sounded more  consistent with stress incontience but she has never had this before  Urine dip done today - results were normal   Will check basic labs  discussed possible trial for pelvic/floor PT - pt prefers to hold off for now but will let us know if she changes her mind    ~ OBESITY, BMI 35-39.9, ADULT (E66.9):  Unchanged  Discussed diet and exercise   Encouraged pt to eat healthy daily and exercise regularly   Will check cholesterol and blood sugar       Med Compliance and SE's: Pt is compliant with meds with no side effects   Patient/Caregiver understand instructions and plan.    New/Revised  Medications Today:   BENZONATATE 100 MG ORAL CAPSULE (BENZONATATE) 1 cap three times daily as needed for cough  PROAIR HFA 108 (90 BASE) MCG/ACT INHALATION AEROSOL SOLUTION (ALBUTEROL SULFATE) 2 puffs every 4 hours as needed for wheezing  MUCINEX 600 MG ORAL TABLET EXTENDED RELEASE 12 HOUR (GUAIFENESIN) 1 by mouth two times daily for cough/congestion            Patient Instructions    It was great to meet you!  Try using a humidifier or hot shower with lots of steam for congestion  You can also try a neti pot  Make sure to stay well hydrated and get plenty of rest  Use the cough and congestion medicines as needed  If things are not improving in the next week or if you become short of breath, develop a fever over 101 or are otherwise feeling a lot worse let us know.   Once you are feeling better it might be worth doing an asthma test if you do tend to have trouble with shortness of breath or wheezing.    I will let you know the urine and blood work results as soon as I get them.  If you would like [place] to set up the special physical therapy appointment for the bladder muscles let me know.    Prescriptions:  MUCINEX 600 MG ORAL TABLET EXTENDED RELEASE 12 HOUR (GUAIFENESIN) 1 by mouth two  times daily for cough/congestion  #30[Tablet] x 0   Entered and Authorized by: Roderic Palau MD   Signed by: Roderic Palau MD on 11/27/2016   Method  used: Electronically to      [id number]* (retail)     [address]     [place], [place]  [address]     Ph: [phone]     Fax: [phone]   RxID: [id number]  PROAIR HFA 108 (90 BASE) MCG/ACT INHALATION AEROSOL SOLUTION (ALBUTEROL SULFATE) 2 puffs every 4 hours as needed for wheezing  #1 x 0   Entered and Authorized by: Roderic Palau MD   Signed by: Roderic Palau MD on 11/27/2016   Method used: Electronically to      [facility]* (retail)     [address]     [place], [place]  [address]     Ph: [phone]     Fax: [phone]   RxID: [id number]  BENZONATATE 100 MG ORAL CAPSULE (BENZONATATE) 1 cap three times daily as needed for cough  #30 x 0   Entered and Authorized by: Roderic Palau MD   Signed by: Roderic Palau MD on 11/27/2016   Method used: Electronically to      [facility]* (retail)     [address]     [place], [place]  [address]     Ph: [phone]     Fax: [phone]   RxID: [id number]

## 2016-11-29 ENCOUNTER — Ambulatory Visit

## 2016-11-29 NOTE — Progress Notes (Signed)
[facility]  [address]  &  [address]  [place], [place] [address]                    [place], [place] [address]  Tel: [phone]                     Fax: [phone]         November 29, 2016      Kelsey Pace  [address]    [place],  [address]        RE:  TEST RESULTS    Dear [name]:    I have carefully reviewed your most recent test results, and below is a summary of my findings.  Please take note of any instructions provided.    Electrolyte Studies:  Normal.  Anion Gap:  11 mmol/L    Goal:  (5-15 mmol/L)  Bicarbonate:  27    Goal:  (22 - 29 mmol/L)  Calcium:  9.0    Goal:  (8.5 - 10.5 mg/dL)  Chloride:  315    Goal:  (98 - 110 mmol)  Potassium:  4.4    Goal:  (3.4 - 5.0 mmol/L)  Sodium:  143    Goal:  (135 - 145 mmol/L)  Blood Glucose:  74    Goal:  (70 - 100 mg/dL Fasting)    Kidney Function Studies:  Normal.  BUN:  12    Goal:  (6 - 20 mg/dL)  Creatinine:  0.7   Goal:  (0.4 - 1.2 mg/dL)  eGFR:  89    Goal:  ( > 90 mL/min/1.73 m2)  Note: If patient African-American, multiply result by 1.212.    Liver Function Studies:  Normal.  Albumin:  4.1    Goal:  (3.5 - 4.9 g/dL)  Alkaline Phosphate:  74    Goal:  (30 - 117 U/L)  Total Bilirubin:  0.4    Goal:  (0.2 - 1.2 mg/dL)  SGOT (AST):  18    Goal:  (0 - 31 U/L)  SGPT (ALT):  17    Goal:  (0 - 31 U/L)  Total Protein:  6.5    Goal:  (6.5 - 8.4 g/dL)    Cholesterol Studies:  Fair.  Comments: Your HDL or "good" cholesterol was a little low. This can be improved by working on physical activity and eating "healthy" fats like nuts, fish and oils.   Cholesterol:  165    Goal:  ( < 200 mg/dL)  HDL:  35    Goal:  ( > 50 mg/dL)  LDL:  88    Goal:  ( < 130 mg/dL)  Non-HDL:  176    Goal:  ( < 130 mg/dL)  HYWVPXTGGYIR:485 Goal:(< 150 mg/dL)    Blood Count:  Normal.  White Blood Cell Count:  6.8    Goal:  (4.5 - 11.0 K/uL)  Red Blood Cell Count:  4.2 M/UL    Goal:  (3.8 - 5.2 M/uL)  Platelet Count:  234 K/UL     Goal:  (130 - 400 K/uL)  Hemoglobin:  13.7    Goal:  (11.7 - 15.7 g/dL)  Hematocrit:  46.2    Goal:  (34.9 - 46.9%)  Mean Corpuscular Volume:  96.2    Goal:  (80.0 - 100.0 fL)  Mean Corpuscular Hemoglobin:  32.5    Goal:  (26.4 - 34.0  pg)  Mean Corpuscular Hemoglobin Concentration:  33.8    Goal:  (31.4 - 35.8 g/dL)  Red Cell Distribution Width:  45.0    Goal:  (35.0 - 51.0%)  Mean Platelet Volume:  10.5    Goal:  (9.0 - 11.8 fl)  Basophils %:  0.6    Goal:  (0 - 2%)  Eosinophils %:  5.8    Goal:  (0 - 5%)  Neutrophils %:  65.1 (?)    Goal:  (40 - 70%)  Monocytes %:  7.6    Goal:  (0 - 12%)    Thyroid Studies:  Normal.  TSH W/Reflex:  2.34    Goal:  (0.27 - 4.20 uIU/mL)      I wanted to let you know the results of your blood work. Please feel free to call with any questions or concerns!       Sincerely,     Kennis Carina, M.D.

## 2016-12-02 ENCOUNTER — Ambulatory Visit

## 2017-01-30 ENCOUNTER — Ambulatory Visit

## 2017-01-30 NOTE — Telephone Encounter (Signed)
Phone Note -       Initial call taken by: Irean Hong Kennard,  January 30, 2017 12:47 PM  Initial Details of Call:  high fever did not take temp.  chills  sore throat  very dry cough this morning  no runny nose  body aches  headache yes   has not been around sick people not aware  chest feels like tight  sob  feels tired,fatigue  this morning was dizzy  no n/v  took yesterday and today Ibuprofen, advyl   aware of no appt available today   adviced urgent care today or call tomorrow sd sick visit.  Routing to RN, Noreene Larsson for further assistance         Follow-up #1  Details: Note reviewed. Routing to Dr.Sharece Fleischhacker.   By: Shaune Pascal RN ~ January 30, 2017 1:19 PM    Details: agree with appt  tylenol and ibuprofen  fluids  thx  By: Rudean Curt. Michel Santee, MD ~ January 30, 2017 3:02 PM    Follow-up #2  Action: Phone call completed  By: Kalman Drape RN ~ January 30, 2017 3:05 PM

## 2017-01-31 ENCOUNTER — Ambulatory Visit

## 2017-01-31 ENCOUNTER — Ambulatory Visit: Admitting: Emergency Medicine

## 2017-01-31 LAB — HX CBC W/DIFFX
HX ABSOLUTE BASOPHILS COUNT: 0.08 10*3/uL (ref 0.0–0.33)
HX ABSOLUTE EOSINOPHIL COUNT: 0.91 10*3/uL — ABNORMAL HIGH (ref 0.0–0.88)
HX ABSOLUTE LYMPHS COUNT: 1.81 10*3/uL (ref 0.99–4.84)
HX ABSOLUTE MONOCYTES COUNT: 0.47 10*3/uL (ref 0.18–1.21)
HX ABSOLUTE NEUTROPHIL CNT: 4.42 10*3/uL (ref 1.8–7.7)
HX BASOS: 1 %
HX EOS: 11.8 %
HX HEMATOCRIT: 41 % (ref 36.0–46.0)
HX HEMOGLOBIN: 14.2 g/dL (ref 12.0–16.0)
HX IMMATURE GRANULOCYTES: 0.3 % (ref 0.0–1.0)
HX LYMPHS: 23.5 %
HX MEAN CORP.HEMO.CONC.: 34.6 g/dL (ref 31.0–37.0)
HX MEAN CORPUSCULAR HEMOGLOBIN: 32.6 pg (ref 26.0–34.0)
HX MEAN CORPUSCULAR VOLUME: 94.3 fL (ref 80.0–100.0)
HX MEAN PLATELET VOLUME: 9.4 fL (ref 9.4–12.4)
HX MONOS: 6.1 %
HX PLATELET COUNT: 210 10*3/uL (ref 150.0–400.0)
HX POLYS: 57.3
HX RED BLOOD COUNT: 4.4 M/uL (ref 4.0–5.2)
HX RED CELL DISTRIBUTION WIDTH SD: 43.6 fL (ref 35.0–51.0)
HX WHITE BLOOD COUNT: 7.7 10*3/uL (ref 4.5–11.0)

## 2017-01-31 LAB — HX COMPREHENSIVE METABOLIC PANELX
HX ALBUMIN: 4 g/dL (ref 3.2–4.9)
HX ALKALINE PHOSPHATASE: 77 U/L (ref 30.0–117.0)
HX ALT: 16 U/L (ref 0.0–31.0)
HX ANION GAP: 10 mmol/L (ref 9.0–19.0)
HX AST: 16 U/L (ref 0.0–31.0)
HX BICARBONATE: 24 mmol/L (ref 22.0–29.0)
HX BUN/CREAT RATIO: 18 (ref 12.0–20.0)
HX BUN: 13 mg/dL (ref 6.0–20.0)
HX CALCIUM: 8.7 mg/dL (ref 8.5–10.5)
HX CHLORIDE: 105 mmol/L (ref 98.0–110.0)
HX CREATININE: 0.7 mg/dL (ref 0.4–1.2)
HX GLOMERULAR FILTRATION RATE: 89
HX GLUCOSE: 92 mg/dL (ref 70.0–100.0)
HX HEMOLYSIS INDEX: 3 mg/dL (ref 0.0–50.0)
HX ICTERIC INDEX: 1 (ref 0.0–2.0)
HX LIPEMIC INDEX: 7 mg/dL (ref 0.0–40.0)
HX POTASSIUM: 4.1 mmol/L (ref 3.6–5.3)
HX SODIUM: 139 mmol/L (ref 137.0–146.0)
HX TOTAL BILIRUBIN: 0.5 mg/dL (ref 0.2–1.2)
HX TOTAL PROTEIN: 6.8 g/dL (ref 6.5–8.4)

## 2017-01-31 LAB — HX SERUM HCG BETA SUBUNIT - QUANT: HX SERUM HCG BETA SUBUNIT - QUANT: <5 (ref <5)

## 2017-01-31 LAB — HX STREP A ANTIGEN TEST: HX STREP A ANTIGEN TEST: NEGATIVE

## 2017-01-31 LAB — HX SERUM HCG (QUALITATIVE): HX SERUM HCG (QUALITATIVE): NEGATIVE

## 2017-01-31 NOTE — ED Provider Notes (Signed)
ED Note  Rpt # [id number]               [facility]  [facility]  Department of Emergency Medicine  [address]  [place], [place] [address]  [phone]    Patient:         Kelsey Pace, Kelsey Pace  DOB:             [birth year]  Pt. Location:    [facility]  MR #:            [id number]  Acct #:          [id number]  Service Date:    01/31/17  Primary Care:    Brigid Re MD  Status Report:   Signed  ________________________________________________________________________________  Emergency Department Note        ED Report    *  * * *    Report:      Date:  01/31/17    Documenting Provider:      Maryjo Rochester MD        CHIEF COMPLAINT: Not feeling well    HPI: 51 year old female presents to the emergency department complaining of  generalized body aches, sore throat, lightheadedness, chills, nonproductive  cough, fatigue and not feeling well over the past 2 days. Patient had slight  swelling of her right face area this morning per daughter. Has felt generalized  abdominal discomfort for the past day.    Patient denies change in vision, change in hearing, chest pain, nausea, vomiting  , dysuria, diarrhea, blood in stool or black stool.    PAST MEDICAL HISTORY: none    SOCIAL HISTORY:  Cigarettes: none  Alcohol: none  Drugs: denies  Living Situation: With family in [place]    MEDICATIONS: none    ALLERGIES: none  Food Allergies: none    Family Hx: hypertension    REVIEW OF SYSTEMS:    HEENT:  No difficulty swallowing and no oral lesions are present.  LYMPH:  No lymph gland swelling.  PULMONARY: No shortness of breath, sputum production or coughing up blood.  CARDIOVASCULAR:  No chest pain, palpitations, shortness of breath or leg  swelling.  GASTROINTESTINAL:  No diarrhea, constipation, nausea, vomiting. No melena or  BRBPR.  GENITOURINARY:  No dysuria, frequency, urgency or nocturia.  NEUROLOGIC:  No numbness, weakness, muscle tenderness.  No chronic headaches.  SKIN:  No rash or  lesions.  PSYCHIATRIC: No depression or anxiety.    Vital signs reviewed and blood pressure 139/82, pulse 81, respiration 20,  temperature 98.0, O2 sat 99%    PHYSICAL EXAM:  General: Well nourished, well developed, no acute distress.  Head/Eyes: Normocephalic, atraumatic. PER, EOMI, anicteric sclera, no injection  of sclera.  ENT/Neck: MMM, pink, no lesions, uvula midline, no swelling of the posterior  oropharynx, no swelling or elevation of the tongue. Supple, FROM, no tenderness.  No cervical LAD.  Cardiovascular: Regular rhythm, normal S1-S2, no murmurs, rubs or gallops.  Respiratory: Lungs clear to auscultation bilaterally.  No wheezes, rales,  rhonchi. No respiratory distress. Breath sounds symmetric.  Abdominal: Soft, nondistended, mild right lower quadrant tenderness, no guarding  , no rigidity, no rebound tenderness. Bowel sounds normoactive.  Genitalia/Genitourinary: No CVA tenderness.  Musculoskeletal/Extremity/Back: No tenderness, no clubbing, no cyanosis, no  edema. Full range of motion.  Skin: No rash. No ecchymosis.  Neurological: Gross motor normal. Cranial Nerve Exam - II,III pupils react  equally; IV, VI - EOMI; V - intact sensation face bilateral; VII -  intact  movement face bilateral; 5/5 strength, normal gait. No gross focal neurologic  deficits.  Psychiatric: Alert and oriented x 3.  Hematology/lymphatic: No petechiae.    Course in the Emergency Department: Patient seen and evaluated by myself.  History and presentation is most consistent with viral illness.    IV established, given IV hydration and Toradol. Will obtain labs and reassess.    Twelve-lead EKG obtained shows normal sinus rhythm at 72 bpm, PR interval 144 ms  , QRS duration 80 ms, QTC 440 ms. Is normal axis. No ST segment elevation or  depression of concern.    WBC 7.7, hemoglobin 14.2, hematocrit 41.0, platelets 210, glucose 92, BUN 13,  creatinine 0.7, sodium 139, potassium 4.1, chloride 105, CO2 24, LFTs within  normal limits,  hCG negative, rapid strep negative, influenza negative.  Transvaginal ultrasound shows 5 cm right ovarian cyst, unremarkable ovaries  otherwise, good flow to both ovaries. CT scan abdomen and pelvis obtained shows  no other acute solid/viscous organ pathology.    Radiographic Studies: Read by radiology, Have been reviewed by myself.    Old Records reviewed: [facility] records and prior labs have been reviewed.    Patient does state some improvement of symptoms with hydration and medication.  Instructions are for rest, plenty of fluids, Motrin. Contact primary physician  and follow-up.    Patient is instructed to return for any uncontrolled fever, vomiting or worse  symptoms. Patient understands all of the instructions, agrees to follow up. All  questions answered.    DIAGNOSIS: 1. Acute myalgias and chills  2. Acute abdominal pain  3. right Ovarian cyst    PATIENT DISPOSITION: Discharge home    CONDITION: Fair      This dictation was created through voice recognition. The dictation is proof  read, however, grammatical errors and voice recognition errors could still be  present. Please contact me about any errors.      ELECTRONICALLY Reina Fuse MD  01/31/17   1603        ATTENTION: Dictation performed using a voice recognition program. Please excuse  spelling and/or grammar errors. Please bring to the author's attention any  material  errors that need to be corrected especially those that may change the meaning of  the sentence.  The physician's electronic signature is applicable to the entire Emergency  Department record for the above date of service. This signature indicates that  the entire Emergency Department record has been reviewed by the above signed  provider.  Please see Emergency Department Medical Record for additional patient  information;  this may include discharge diagnosis,interpretation of EKG, laboratory, and/or  radiological studies, Emergency Department course, etc.  This document was  reproduced from its electronic form and should not, under any  circumstances, be considered a complete and final reproduction of the legal  medical  record unless signed by the responsible author.

## 2017-02-01 ENCOUNTER — Ambulatory Visit: Admitting: Family Medicine

## 2017-02-01 ENCOUNTER — Ambulatory Visit

## 2017-02-01 NOTE — Telephone Encounter (Signed)
Phone Note -       Initial call taken by: Princella Pellegrini. Elisabeth Most MD,  February 01, 2017 12:18 PM  Initial Details of Call:  Please call patient regarding recent ER visit.  "We noticed that you were recently in the emergency room. We wanted to see how you were feeling and offer you an appointment if you are not feeling better or if you were advised to do so at the ER."        Follow-up #1  Details: scheduled pt today at 2:45 with Dr Elisabeth Most   pt is aware of appt   By: Lawernce Ion Xenia ~ February 03, 2017 10:44 AM

## 2017-02-03 ENCOUNTER — Ambulatory Visit: Admitting: Family Medicine

## 2017-02-03 ENCOUNTER — Ambulatory Visit

## 2017-02-03 NOTE — Progress Notes (Signed)
[facility]  [address]  &  [address]  [place], [place] [address]                    [place], [place] [address]  Tel: [phone]                     Fax: [phone]           February 03, 2017      Regarding Patient: Kelsey Pace  DOB: 1966-11-14    [name] was last seen in our office on: February 03, 2017.  [name] is currently under my care, and is not at this time medically able to attend work due to medical reasons.  Starting on: 02/03/2017  Able to Return on: 02/06/2017      Sincerely,      Vicki Mallet, MD  [facility]

## 2017-02-03 NOTE — Telephone Encounter (Signed)
Phone Note -       Initial call taken by: Lawernce Ion Lowndes,  February 03, 2017 4:14 PM  Initial Details of Call:  scheduled u/s at [facility] on Tuesday 03/25/2017 @ 6pm     pt is aware

## 2017-02-03 NOTE — Progress Notes (Signed)
[facility]  [address]  &  [address]  [place], [place] [address]                    [place], [place] [address]  Tel: [phone]                     Fax: [phone]         February 03, 2017      Kelsey Pace  [address]  [place], [place] [address]    Dear [name]:      We are writing to let you know that you missed your scheduled appointment with our office on 02/03/2017 at 2:45 PM.  We sincerely hope that you are well and your health is our primary concern.  Our goal at [facility] is to provide you with high-quality and efficient care.      We understand that there are occasions when a patient must miss an appointment due to unforeseen circumstances or a scheduling conflict beyond his or her control. In this event, we ask that you call our office and cancel your appointment at least 24 hours prior to the scheduled visit. This courtesy allows the office staff to schedule another patient who is also in need of medical care.    Please call the office and we would be happy to provide support with rescheduling the appointment for a date and time that will work for you.     Thank you for choosing [facility].  We hope to speak with you soon.      Sincerely,      Vicki Mallet, MD  [facility]

## 2017-02-03 NOTE — Progress Notes (Signed)
Primary Provider:  Princella Pellegrini. Elisabeth Most MD      History of Present Illness:  Kelsey Pace is a 51 Year Old Female who presents today for: abd pain   Specialists seen since last visit?  Has previsit planning and a huddle been performed on this patient?no    CC: hospital f/u  went to the ER 3 days ago for body aches, thought she had the flu  during exam had severe abdominal pain in RLQ so they evaluated for appendicitis  work-up was negative except for R ovarian cyst    now feeling much better except still has pain if RLQ and hurts if she presses on it or when walking  worried about ovarian cyst as she had a friend with this who ended up having cancer    sometimes the pain goes to her leg  sometimes has pain in low back but mostly it is RLQ and R inguinal region - when pressing on it or bending forward, better with resting  no diarrhea or constipation  irregular menstrual bleeding, last time 2 weeks ago, has mirena IUD    now body aches resolved  no more fevers/chills  no cough or SOB  no chest tightness    All other pertinent systems reviewed and are negative.        Current Problems- Reviewed during today's visit  ABDOMINAL PAIN  UNSPECIFIED OVARIAN CYST, RIGHT SIDE - SIMPLE CYST  URINARY INCONTINENCE  COUGH  SKIN RASH  PREVENTIVE HEALTH CARE  SCREENING, PAP SMEAR, LOW RISK  SLEEP APNEA, OBSTRUCTIVE, SEVERE  IRREGULAR MENSTRUATION  OBESITY, BMI 35-39.9, ADULT  SCREENING FOR DEPRESSION  VIRAL SYNDROME  FAMILY HISTORY OF HYPERTENSION  OBESITY  SNORING  ENLARGEMENT OF LYMPH NODES  SINUSITIS, ACUTE  ACUTE TONSILLITIS  FEVER    Current Medications- Reviewed during today's visit  FLONASE 50 MCG/ACT NASAL SUSPENSION (FLUTICASONE PROPIONATE): one spary in each nostril  TYLENOL CHEST CONGESTION 500-200 MG/15ML ORAL LIQUID (ACETAMINOPHEN-GUAIFENESIN): Take one tablet by mouth daily  MIRENA (52 MG) 20 MCG/24HR INTRAUTERINE INTRAUTERINE DEVICE (LEVONORGESTREL):   TRIAMCINOLONE ACETONIDE 0.1 % EXTERNAL CREAM: apply two times  daily as needed to  rash  BENZONATATE 100 MG ORAL CAPSULE: 1 cap three times daily as needed for cough  PROAIR HFA 108 (90 BASE) MCG/ACT INHALATION AEROSOL SOLUTION (ALBUTEROL SULFATE): 2 puffs every 4 hours as needed for wheezing  MUCINEX 600 MG ORAL TABLET EXTENDED RELEASE 12 HOUR (GUAIFENESIN): 1 by mouth two times daily for cough/congestion  Current Allergies- Reviewed during today's visit  NO KNOWN ALLERGIES  Past Medical History  No hx of mental illness  functional ovarian cysts, recurrent small simple cysts, on u/s  GERD, vitamin D deficiency  Surgical History  denies  Family History  mother - HTN  father - killed at age 47 by terrorists [place]  Sister and brother with asthma  Mat GM - passed away from asthma attack    Denies DM, breast cancer, colon cancer, heart disease  Social History  born [place]  Lives with husband  Has 3 children - one lives in 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 Height: 62.5  in.    BMI: 36.24  BSA:    2    Wt chg: 1.60  Weight: 199 lbs   BMI: 35.95 on 11/27/2016  Temperature: 98.49 deg F.     Temp Site: oral    Pulse rate: 90    Pulse rhythm: regular  BP: 130/86 - large cuff, right arm      Patient is not experiencing pain    Medications and Allergies Reviewed    Signed: Lawernce Ion [place].Marland KitchenMarland KitchenMarland KitchenFebruary  5, 2018 3:19 PM  PHQ 2    Over the last 2 weeks, how often have you been bothered by any of the following problems?  1. Little interest or pleasure in doing things:  0   - Not at all  2. Feeling down, depressed, or hopeless:  0   - Not at all        Physical  Exam    General:      well developed, well nourished, in no acute distress.    Head:      normocephalic and atraumatic.    Lungs:      clear bilaterally to auscultation.    Heart:      RRR, no murmur  Abdomen:      soft, obese, normal bowel sounds present  + ttp in RLQ and milder ttp in LLQ  no organomegaly or masses     Msk:      back: FROM but pain in RLQ with flexion, no paraspinal m tenderness  hips  NT, with FROM but pain on R side with FABER and FADIR  normal gait    Psych:      alert and cooperative; normal mood and affect; normal attention span and concentration.           Assessment and Plan:      ~ ABDOMINAL PAIN (R10.9) :     ~ UNSPECIFIED OVARIAN CYST, RIGHT SIDE - SIMPLE CYST (N83.201) : improving, suspect pain was due to acute viral illness but cyst could be contributing, now with RLQ pain, R inguinal pain which is present but continuing to improve. no red flag symptoms  Reviewed u/s result with pt which showed simple cyst 5.4 x 3.6 x 4.9 cm. Given size and that she is not as likely to be ovulating due to age, warrants monitoring (unclear if post-menopausal since she has hormonal IUD)  future u/s ordered & scheduled in 8 weeks : discussed if cyst resolved or getting smaller no eval needed; if enlarging / symptomatic would refer to GYN  encouraged rest, heat as needed, otc pain meds sparingly as needed for pain  work note given to take 2 more days off to rest  - Reviewed warning signs to call or go to ED for  including acutely worsening or severe pain    Med Compliance and SE's: Pt is compliant with meds with no side effects   Patient/Caregiver understand instructions and plan.    Medications Removed Today:   TAMIFLU 75 MG ORAL CAPSULE (OSELTAMIVIR PHOSPHATE) take one tablet by mouth daily for 7 days.            Patient Instructions    I am glad you are starting to feel better  Continue to  rest, avoid activities and positions that make the pain worse  IF needed ok to take tylenol or ibuprofen but try to  limit medicine since we want to see if you are healing  Ok to use a heating pad as needed for pain    Call if new or worsening symptoms  Otherwise we will plan to repeat the ultrasound in about 2 months to see if the cyst has resolved. if it is getting bigger at that time, we can refer you to a gynecologisit    We will call you about the ultrasound appointment later this week

## 2017-02-04 ENCOUNTER — Other Ambulatory Visit (HOSPITAL_BASED_OUTPATIENT_CLINIC_OR_DEPARTMENT_OTHER): Payer: Self-pay | Admitting: Physician Assistant

## 2017-02-04 DIAGNOSIS — R519 Headache, unspecified: Secondary | ICD-10-CM

## 2017-02-04 DIAGNOSIS — R51 Headache: Principal | ICD-10-CM

## 2017-02-04 NOTE — Progress Notes (Signed)
PER Pharm[facility], [name] is a 51 year old female has requested a refill of ibuprofen.      Last Office Visit: 01/31/2016 with patel, rishita  Last Physical Exam: 11/08/2015      Other Med Adult:  Most Recent BP Reading(s)  02/04/16 : 103/67          Cholesterol (mg/dL)   Date Value   04/54/098107/31/2014 178   ----------    LOW DENSITY LIPOPROTEIN DIRECT (mg/dL)   Date Value   19/14/782907/31/2014 107   ----------    HIGH DENSITY LIPOPROTEIN (mg/dL)   Date Value   56/21/308607/31/2014 28 (L)   ----------    TRIGLYCERIDES (mg/dl)   Date Value   57/84/696201/06/2007 204 (H)   ----------        THYROID SCREEN TSH REFLEX FT4 (uIU/mL)   Date Value   01/31/2016 2.440   ----------        TSH (THYROID STIM HORMONE) (uIU/mL)   Date Value   08/01/2009 1.93   ----------      HEMOGLOBIN A1C (%)   Date Value   09/27/2015 5.2   ----------    No results found for: POCA1C      No results found for: INR      SODIUM (mmol/L)   Date Value   02/03/2016 145   ----------      POTASSIUM (mmol/L)   Date Value   02/03/2016 3.7   ----------          CREATININE (mg/dL)   Date Value   95/28/413202/03/2016 0.8   ----------      Documented patient preferred pharmacies:    [id number] [place], [place] - [address]  Phone: [phone] Fax: [phone]

## 2017-03-25 ENCOUNTER — Ambulatory Visit: Admitting: Family Medicine

## 2017-03-25 ENCOUNTER — Ambulatory Visit

## 2017-03-26 ENCOUNTER — Ambulatory Visit: Admitting: Family Medicine

## 2017-03-26 NOTE — Telephone Encounter (Signed)
Phone Note -       Call back at Ph1 [phone]  Initial call taken by: Princella Pellegrini. Elisabeth Most MD,  March 26, 2017 4:57 PM  Initial Details of Call:  Called pt re: u/s results: right ovarian cyst is smaller but there is a new left ovarian cyst  She denies any pain except when menstruating  Plan: recommended she f/u with GYN given her age, new complex cyst. Discussed they will decide whether to monitor on u/s vs remove the cyst  Agrees with plan. prefers afternoon appt, female provider  Advised to call right away or ER if severe pain          Problems:  Changed problem from UNSPECIFIED OVARIAN CYST, RIGHT SIDE - SIMPLE CYST (ICD10-N83.201) - 5.4 x 3.6 x 4.9 cm to OTHER OVARIAN CYST, LEFT SIDE- COMPLEX CYST ON U/S 02/2017 (ONG29-B28.413)  Orders:  Added new Referral order of OBGYN Referral - Tracking (OBGYNTR) - Signed

## 2017-03-27 ENCOUNTER — Ambulatory Visit (HOSPITAL_BASED_OUTPATIENT_CLINIC_OR_DEPARTMENT_OTHER)

## 2017-03-27 NOTE — Progress Notes (Signed)
GYN  Appointment: Wednesday April 02, 2017 @1 :30pm    Ferd Hibbs, NP  Dr. Malachy Chamber  [address]  [place], [place] [address]  [phone]    pt made aware sent to referrals for processing  ..................................................................Marland KitchenOneita Kras  March 27, 2017 11:05 AM      Observations:  Added new observation of ENBSRVTYPENC: Quick Note (03/27/2017 10:49)

## 2017-03-31 ENCOUNTER — Ambulatory Visit: Admitting: Women's Health

## 2017-03-31 NOTE — Progress Notes (Signed)
Observations:  Added new observation of OBALERTNOTES:   PCP:  04/02/17  CC:  ovarian cysts/pelvic pain  pt presented to ER in early February 2018 with c/o nausea and pelvic pain  TVUS done was unremarkable except R ovarian simple cyst measuring 5.4 x 3.6 x 4.9 cm, no L ovarian cyst seen.  she had a follow up TVUS done on 03/25/17 that showed a complex left ovarian cyst and presents in follow up.    03/25/17 Examination: Pelvic Ultrasound      Indication: Ovarian cyst follow-up      Technique: Transvaginal        Comparison: January 31, 2017      Findings:      Uterus Size:  10.7 x 5.9 x 5.7 cm      Uterus: Retroverted. Nabothian cysts identified. IUD in satisfactory      position in the endometrial cavity.      Endometrial Thickness:   4.4 cm      Right Ovary Size: 2.4 x 2.3 x 1.7 cm       Left Ovary Size: 4.2 x 3.5 x 2.3 cm       Right Ovary:  Normal appearance previous right ovarian cyst is to be      collapsed and smaller. Normal flow      Left Ovary:  Normal appearance with complex cyst measuring 3.9 x 3.4 x      3.7 cm. Normal flow      Free Fluid: None      IMPRESSION: Right Ovarian cyst described in February 2018 have      partially decompressed. New Left ovarian cyst with some echogenicity      present.      IUD in the endometrial canal.    Other c/o:  LMP:              Last PE with pcp:     last urine with pcp:  Menopause/Hysterectomy/BSO  Last colonoscopy  Last BDT  Last mammogram  Last pap  11/18/16 NO ECC/NL  Last HPV 11/18/16 NEG  Any history of abnormal pap  Marital status: M-separated, S-engaged, D  Lives:  Sexually active             Duration:  total partners:  current partner:       Birth control:   IUD  HPV vaccine:  NA  gyn problem list  1.  2.  gyn medication:  1.  2.  gyn testing:  1.    ROS: no other c/o    (03/31/2017 16:02)  Added new observation of HEIGHT (CM): 158.75 cm (03/31/2017 16:02)          Vital Signs     Height: 62.5 in. (158.75 cm.)        GYN Preventive Testing   Pap: NO ECC/Normal  Date: 11/18/2016  HPV: Negative Date: 11/18/2016

## 2017-04-02 ENCOUNTER — Ambulatory Visit

## 2017-04-02 ENCOUNTER — Ambulatory Visit: Admitting: Women's Health

## 2017-04-02 NOTE — Progress Notes (Signed)
GYN Annual Visit       Patient's PCP: Princella Pellegrini. Elisabeth Most MD    Vital Signs   Weight: 204 lb. (92.73 kg.)  Height: 62.5 in. (158.75 cm.)    Body Mass Index: 36.85  Body Surface Area (m2): 1.94    LMP: 03/20/2017      Blood Pressure #1: 130 / 80 mm Hg (right arm)        Patient in pain? N  No Translator Needed   Jerry Caras....................Marland KitchenMarland KitchenApril  4, 2018 2:21 PM      History of Present Illness:    Chief Complaint: gyn problem visit  HPI: Kelsey Pace is a 51 Years Old,Married,  Female who is a G 3, P3, and LMP of 03/20/2017, last pap smearNO ECC/Normal (11/18/2016) , last hpv Negative (11/18/2016) who uses IUD for birth control.    CC:  ovarian cysts/pelvic pain  pt presented to ER in early February 2018 with c/o nausea and pelvic pain  TVUS done was unremarkable except R ovarian simple cyst measuring 5.4 x 3.6 x 4.9 cm, no L ovarian cyst seen.  she had a follow up TVUS done on 03/25/17 that showed a complex left ovarian cyst and presents in follow up.    03/25/17 Examination: Pelvic Ultrasound      Indication: Ovarian cyst follow-up      Technique: Transvaginal        Comparison: January 31, 2017      Findings:      Uterus Size:  10.7 x 5.9 x 5.7 cm      Uterus: Retroverted. Nabothian cysts identified. IUD in satisfactory      position in the endometrial cavity.      Endometrial Thickness:   4.4 cm      Right Ovary Size: 2.4 x 2.3 x 1.7 cm       Left Ovary Size: 4.2 x 3.5 x 2.3 cm       Right Ovary:  Normal appearance previous right ovarian cyst is to be      collapsed and smaller. Normal flow      Left Ovary:  Normal appearance with complex cyst measuring 3.9 x 3.4 x      3.7 cm. Normal flow      Free Fluid: None      IMPRESSION: Right Ovarian cyst described in February 2018 have      partially decompressed. New Left ovarian cyst with some echogenicity      present.      IUD in the endometrial canal.    Other c/o:  denies any pain or symptoms at this time.  LMP:   03/19/17 - period comes every month     Last mammogram   10/2016 NL  Last pap  11/18/16 NO ECC/NL  Last HPV 11/18/16 NEG  Any history of abnormal pap   yes   Marital status: M  Lives:   husband  Sexually active  yes      Birth control:   IUD    GYN History:   Patient's Age: 51 Years Old  Gravida: 3  Para: 3  NSVD:  3  Cycle: regular  Interval b/n periods: 30-35  LMP: 03/20/2017    Sexual Health Sexually Active? Yes  Satisfied with Sex? Yes  Partners Female  Multiple Partners No  Desire Pregnancy? No  Current Contraceptive:   IUD    Current Problems - Reviewed Today  OTHER OVARIAN CYST, LEFT SIDE (ICD10-N83.292)  URINARY INCONTINENCE (ICD-788.30) (ZOX09-U04)  COUGH (  ZOX-096.0) (ICD10-R05)  SKIN RASH (ICD-782.1) (AVW09-W11)  PREVENTIVE HEALTH CARE (ICD-V70.0) (ICD10-Z00.00)  SCREENING, PAP SMEAR, LOW RISK (ICD-V76.2) (BJY78-G95.4)  SLEEP APNEA, OBSTRUCTIVE, SEVERE (ICD-327.23) (ICD10-G47.33)  IRREGULAR MENSTRUATION (ICD-626.4) (ICD10-N92.6)  OBESITY, BMI 35-39.9, ADULT (ICD-278.00) (ICD10-E66.9)  SCREENING FOR DEPRESSION (ICD-V79.0)  VIRAL SYNDROME (ICD-079.99) (ICD10-B34.9)  FAMILY HISTORY OF HYPERTENSION (ICD-V17.49) (ICD10-Z82.49)  OBESITY (ICD-278.00) (ICD10-E66.9)  SNORING (ICD-786.09) (ICD10-R06.83)  ENLARGEMENT OF LYMPH NODES (ICD-785.6) (ICD10-R59.9)  SINUSITIS, ACUTE (ICD-461.9) (ICD10-J01.90)  ACUTE TONSILLITIS (ICD-463)  FEVER (ICD-780.60) (ICD10-R50.9)    Current Medications - Reviewed Today  FLONASE 50 MCG/ACT NASAL SUSPENSION (FLUTICASONE PROPIONATE) one spary in each nostril  TYLENOL CHEST CONGESTION 500-200 MG/15ML ORAL LIQUID (ACETAMINOPHEN-GUAIFENESIN) Take one tablet by mouth daily  MIRENA (52 MG) 20 MCG/24HR INTRAUTERINE INTRAUTERINE DEVICE (LEVONORGESTREL)   TRIAMCINOLONE ACETONIDE 0.1 % EXTERNAL CREAM (TRIAMCINOLONE ACETONIDE) apply two times daily as needed to  rash  BENZONATATE 100 MG ORAL CAPSULE (BENZONATATE) 1 cap three times daily as needed for cough  PROAIR HFA 108 (90 BASE) MCG/ACT INHALATION AEROSOL SOLUTION (ALBUTEROL  SULFATE) 2 puffs every 4 hours as needed for wheezing  MUCINEX 600 MG ORAL TABLET EXTENDED RELEASE 12 HOUR (GUAIFENESIN) 1 by mouth two times daily for cough/congestion    Current Allergies  No Known Allergies    Past Medical History  No hx of mental illness  functional ovarian cysts, recurrent small simple cysts, on u/s  GERD, vitamin D deficiency    Obstetric History  G3P3 NSVD x 3    Surgical History  denies    Family History  mother - HTN  father - killed at age 30 by terrorists [place]  Sister and brother with asthma  Mat GM - passed away from asthma attack    Denies DM, breast cancer, colon cancer, heart disease    Social History  born [place]  Lives with husband  Has 3 children - one lives in [place], one in [place], one in [place]  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 Yes  Satisfied with Sex? Yes  Partners Female  Multiple Partners No  Violence Screening   Do you feel safe in your current relationship? Yes        Past Pregnancy History      Gravida:  3     Term Births:  3     Premature Births: 3     Para:   3    Review of Systems   General: Complains of see HPI.   All other pertinent systems were reviewed and are negative       Physical Exam     Constitutional: Alert, no acute distress, well hydrated, well developed, well nourished, appropriate dress,non-ill appearing.   Abdomen: nondistended, nontender, no guarding, no CVA tenderness bilaterally.  Vulva: Normal appearance, normal hair distribution, no lesions or masses.   Vagina:  Normal, rugated, physiologic discharge, no lesions, no masses, adequate pelvic support.   Cervix: Normal, no motion tenderness, no lesions.   Uterus: smooth, mobile, non-tender, adequate support, firm, no prolapse, average size.   Adnexa: Normal, no masses, mobile, nontender.   Rectum: EXTERNAL HEMORRHOIDS.       Assessment and Plan:     Problem #1:  OTHER OVARIAN CYST, LEFT SIDE  discussed ovarian cysts and menstrual cycle, reviewed difference between simple and complex cysts  reviewed complex cysts more concerning for ovarian cancer, overall risk is low but necessary to follow these cysts.  discussed surgical options.  If cyst is >5cm, strong recommendation for surgery.   discussed ovarian torsion, s/s.  discussed following complex left ovarian cyst with TVUS, repeat in 4-6 weeks and see if any change (wasn't seen on ultrasound done in early February).  Depending on follow up TVUS, may recommend surgery versus monitoring.    pt reports she is not having any pain at this time, no symptoms or concerns.  Advised to call if pain returns.    she elects to monitor cyst for now, will schedule repeat TVUS in 4-6 weeks and rto 1 week later.    In total I spent 25 minutes of face-to-face discussion and evaluation with the patient today more thatn 50% of which was spent on counseling.          Return to Office   Disposition: return to clinic after TVUS       Patient Instructions:  1)  rto 1 week after TVUS    Orders:  Added new Service order of Patient Encounter ([id number]) - Signed  Added new Test order of US - Transvaginal (US-TV) - Signed  Added new Service order of OV New Level III ([id number]) - Signed        Dr. Dorna Bloom provided supervision for this service and is in agreement with the assessment and plan as stated above.     signed Ferd Hibbs, NP    signed Richarda Osmond. Dorna Bloom MD

## 2017-05-07 ENCOUNTER — Ambulatory Visit: Admitting: Women's Health

## 2017-05-07 ENCOUNTER — Ambulatory Visit

## 2017-05-09 ENCOUNTER — Ambulatory Visit: Admitting: Women's Health

## 2017-05-09 NOTE — Progress Notes (Signed)
Observations:  Added new observation of OBALERTNOTES:     myriad family history screening done on 04/02/17  plan: she does not meet current guidelines for this test.  she declines testing    PCP:  04/02/17  CC:  ovarian cysts/pelvic pain  pt presented to ER in early February 2018 with c/o nausea and pelvic pain  TVUS done was unremarkable except R ovarian simple cyst measuring 5.4 x 3.6 x 4.9 cm, no L ovarian cyst seen.  she had a follow up TVUS done on 03/25/17 that showed a complex left ovarian cyst and presents in follow up.    03/25/17 TVUS:      Endometrial Thickness:  0.4 cm      Right Ovary:  Normal appearance previous right ovarian cyst is to be collapsed and smaller. Normal flow      Left Ovary:  Normal appearance with complex cyst measuring 3.9 x 3.4 x 3.7cm      IMPRESSION: Right Ovarian cyst described in February 2018 have      partially decompressed. New Left ovarian cyst with some echogenicity      present. IUD in the endometrial canal.    05/07/17 TVUS:      There is a 3.1 x 3.8 x 2.7 cm complex cyst seen left ovary      with low level echoes suggesting possible endometrioma. Similar size      and appearance noted prior exam 03/25/2017. In addition similar      appearing complex cyst was seen in the right ovary on exam 01/31/2017.      No complex cyst seen in right ovary on this exam. No free fluid seen.    Other c/o:  denies any pain or symptoms at this time.  LMP:   03/19/17 - every month          Last PE with pcp:     last urine with pcp:  Menopause/Hysterectomy/BSO  Last colonoscopy  Last BDT  Last mammogram   10/2016 NL  Last pap  11/18/16 NO ECC/NL  Last HPV 11/18/16 NEG  Any history of abnormal pap   yes   Marital status: M  Lives:   husband  Sexually active  yes           Duration:  total partners:  current partner:       Birth control:   IUD  HPV vaccine:  NA  gyn problem list  1.  2.  gyn medication:  1.  2.  gyn testing:  1.    ROS: no other c/o    (05/09/2017 15:54)

## 2017-05-14 ENCOUNTER — Ambulatory Visit

## 2017-05-14 ENCOUNTER — Ambulatory Visit: Admitting: Women's Health

## 2017-05-14 NOTE — Progress Notes (Signed)
GYN Annual Visit         Vital Signs   Weight: 205 lb. (93.18 kg.)  Height: 62.5 in. (158.75 cm.)    Body Mass Index: 37.03  Body Surface Area (m2): 1.94    LMP: 05/05/2017      Blood Pressure #1: 130 / 80 mm Hg (right arm)        Patient in pain? N  No Translator Needed   Jerry Caras......................May 14, 2017 4:26 PM      History of Present Illness:    Chief Complaint: gyn office visit  HPI: Kelsey Pace is a 51 Years Old, Married, Female who is a G 3, P 3, and LMP of 05/05/2017, last pap smear NO ECC/Normal (11/18/2016), last hpv Negative (11/18/2016) who uses IUD for birth control.    CC: RESULTS  ovarian cysts/pelvic pain    pt presented to ER in early February 2018 with c/o nausea and pelvic pain  TVUS done was unremarkable except R ovarian simple cyst measuring 5.4 x 3.6 x 4.9 cm, no L ovarian cyst seen.  she had a follow up TVUS done on 03/25/17 that showed a complex left ovarian cyst and presents in follow up.    03/25/17 TVUS:      Endometrial Thickness:  0.4 cm      Right Ovary:  Normal appearance previous right ovarian cyst is to be collapsed and smaller. Normal flow      Left Ovary:  Normal appearance with complex cyst measuring 3.9 x 3.4 x 3.7cm      IMPRESSION: Right Ovarian cyst described in February 2018 have      partially decompressed. New Left ovarian cyst with some echogenicity      present. IUD in the endometrial canal.    05/07/17 TVUS:      There is a 3.1 x 3.8 x 2.7 cm complex cyst seen left ovary      with low level echoes suggesting possible endometrioma. Similar size      and appearance noted prior exam 03/25/2017. In addition similar      appearing complex cyst was seen in the right ovary on exam 01/31/2017.      No complex cyst seen in right ovary on this exam. No free fluid seen.    recommend pelvic MRI to monitor.  no pain today.  reports menses historically very regular but at times painful.  Reviewed potential endometrioma and discussed endometriosis.    Other c/o:  denies  any pain or symptoms at this time.  she feels well.  She only has pain during her menses.  LMP:   05/05/17    - every 2-3 months, moderate flow, crampy  Last mammogram   10/2016 NL  Last pap  11/18/16 NO ECC/NL  Last HPV 11/18/16 NEG  Any history of abnormal pap   yes   Marital status: M  Lives:   husband  Sexually active  yes            Birth control:   IUD  HPV vaccine:  NA    GYN History:   Patient's Age: 51 Years Old  Gravida: 3  Para: 3  NSVD:  3  Menarche (yrs):  12  Cycle: irregular  Length of period: 1  Interval b/n periods: 60-90  LMP: 05/05/2017    Sexual Health Sexually Active? Yes  Satisfied with Sex? Yes  Partners Female  Multiple Partners No  Desire Pregnancy? No  Current Contraceptive:   IUD  Current Problems - Reviewed Today  OTHER OVARIAN CYST, LEFT SIDE (ICD10-N83.292)  URINARY INCONTINENCE (ICD-788.30) (VOZ36-U44)  COUGH (ICD-786.2) (ICD10-R05)  SKIN RASH (ICD-782.1) (IHK74-Q59)  PREVENTIVE HEALTH CARE (ICD-V70.0) (ICD10-Z00.00)  SCREENING, PAP SMEAR, LOW RISK (ICD-V76.2) (DGL87-F64.4)  SLEEP APNEA, OBSTRUCTIVE, SEVERE (ICD-327.23) (ICD10-G47.33)  IRREGULAR MENSTRUATION (ICD-626.4) (ICD10-N92.6)  OBESITY, BMI 35-39.9, ADULT (ICD-278.00) (ICD10-E66.9)  SCREENING FOR DEPRESSION (ICD-V79.0)  VIRAL SYNDROME (ICD-079.99) (ICD10-B34.9)  FAMILY HISTORY OF HYPERTENSION (ICD-V17.49) (ICD10-Z82.49)  OBESITY (ICD-278.00) (ICD10-E66.9)  SNORING (ICD-786.09) (ICD10-R06.83)  ENLARGEMENT OF LYMPH NODES (ICD-785.6) (ICD10-R59.9)  SINUSITIS, ACUTE (ICD-461.9) (ICD10-J01.90)  ACUTE TONSILLITIS (ICD-463)  FEVER (ICD-780.60) (ICD10-R50.9)    Current Medications - Reviewed Today  FLONASE 50 MCG/ACT NASAL SUSPENSION (FLUTICASONE PROPIONATE) one spary in each nostril  TYLENOL CHEST CONGESTION 500-200 MG/15ML ORAL LIQUID (ACETAMINOPHEN-GUAIFENESIN) Take one tablet by mouth daily  MIRENA (52 MG) 20 MCG/24HR INTRAUTERINE INTRAUTERINE DEVICE (LEVONORGESTREL)   TRIAMCINOLONE ACETONIDE 0.1 % EXTERNAL CREAM (TRIAMCINOLONE  ACETONIDE) apply two times daily as needed to  rash  BENZONATATE 100 MG ORAL CAPSULE (BENZONATATE) 1 cap three times daily as needed for cough  PROAIR HFA 108 (90 BASE) MCG/ACT INHALATION AEROSOL SOLUTION (ALBUTEROL SULFATE) 2 puffs every 4 hours as needed for wheezing  MUCINEX 600 MG ORAL TABLET EXTENDED RELEASE 12 HOUR (GUAIFENESIN) 1 by mouth two times daily for cough/congestion    Current Allergies  No Known Allergies    Past Medical History  No hx of mental illness  functional ovarian cysts, recurrent small simple cysts, on u/s  GERD, vitamin D deficiency    Obstetric History  G3P3 NSVD x 3    Surgical History  denies    Family History  mother - HTN  father - killed at age 60 by terrorists [place]  Sister and brother with asthma  Mat GM - passed away from asthma attack    Denies DM, breast cancer, colon cancer, heart disease    Social History  born [place]  Lives with husband  Has 3 children - one lives in [place], one in [place], one in [place]  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 No  Contraceptive History: IUD      Sexual Health  Sexually Active? Yes  Satisfied with Sex? Yes  Partners Female  Multiple Partners No        Past Pregnancy History      Gravida:  3     Term Births:  3     Premature Births: 3     Para:   3    Review of Systems   General: Complains of see HPI.   All other pertinent systems were reviewed and are negative       Physical Exam  Constitutional: Alert, no acute distress, well hydrated, well developed,  well nourished, appropriate dress,non-ill appearing.       Impression and Recommendations:    PROBLEM #1 OTHER OVARIAN CYST, LEFT SIDE:    discussed reports and persistent complex cyst on left ovary.  ?endometrioma.  discussed endometriosis and written information given. reviewed this can cause painful periods.  IUD is in proper position according to TVUS done.  Would recommend pelvic MRI to further evaluate complex cyst.  discussed importance of follow-up - low risk of malignancy but cannot be ruled out at this time.  she will schedule pelvic MRI.    In total I spent 25 minutes of face-to-face discussion and evaluation with the patient today more than 50% of which was spent on counseling.      Return to Office   Disposition: return to clinic after pelvic MRI       Patient Instructions:  1)  pt to schedule pelvic MRI and rto afterwards    Orders:  Added new Service order of Patient Encounter ([id number]) - Signed  Added new Test order of MRI - Pelvis (MRI-PELV) - Signed  Added new Service order of OV Est Level III ([id number]) - Signed        Dr. Dorna Bloom provided supervision for this service and is in agreement with the assessment and plan as stated above.     signed Ferd Hibbs, NP    signed Richarda Osmond. Dorna Bloom MD

## 2017-05-15 ENCOUNTER — Ambulatory Visit

## 2017-05-19 ENCOUNTER — Ambulatory Visit

## 2017-05-19 NOTE — Telephone Encounter (Signed)
Phone Note -     Outgoing Call    Initial call taken by: Amil Amen,  May 19, 2017 12:17 PM  Call placed to: Patient  Summary of Call: left message for patient to cll the office    [need to schedule patient for MRI  and rto office visit]    Action Taken: Left Message for Patient    Follow-up #1  Details: spoke with patient and scheduled her MRI for 05/28/17 @ 3:30pm she must arrive by 3pm and rto on 06/03/17 @ 4:30pm  Action: Phone call completed  By: Amil Amen ~ May 21, 2017 4:03 PM

## 2017-05-21 ENCOUNTER — Ambulatory Visit

## 2017-05-28 ENCOUNTER — Ambulatory Visit

## 2017-05-28 ENCOUNTER — Ambulatory Visit: Admitting: Women's Health

## 2017-05-29 ENCOUNTER — Ambulatory Visit: Admitting: Women's Health

## 2017-05-29 NOTE — Progress Notes (Signed)
Observations:  Added new observation of OBALERTNOTES: myriad family history screening done on 04/02/17  plan: she does not meet current guidelines for this test.  she declines testing    PCP:    06/03/17  CC: RESULTS - pt seen for ovarian cysts/pelvic pain  when IUD inserted?  endometriosis?  adenomyosis?  pain management.  could try continuous OCPs if not contraindicated.    pt presented to ER 01/2017 with c/o nausea, pelvic pain  TVUS: unremarkable except R ovarian simple cyst measuring 5.4 x 3.6 x 4.9 cm, no L ovarian cyst seen.    03/25/17 TVUS:      Endometrium: 0.4 cm      R Ovary:  NL. previous R ovarian cyst is collapsed/smaller. NL flow      L Ovary:  NL w/ complex cyst measuring 3.9 x 3.4 x 3.7cm      IUD in the endometrial canal.    05/07/17 TVUS:         3.1 x 3.8 x 2.7cm complex cyst seen L ovary w/ low level echoes ?endometrioma. Similar size and appearance             noted prior exam 03/25/2017. similar appearing complex cyst was seen in the R ovary on exam 01/31/2017.         No complex cyst seen in right ovary on this exam. No FF    05/28/17 pelvic MRI:  Uterus is 11 cm in length and 6.3 cm anterior posterior dimension. Assessment of the myometrium shows no differentiation between junctional zone and myometrium and scattered small cysts within the myometrial tissue suggesting diffuse adenomyosis. L ovary is NL in size and contains 2 small follicles.  R ovary contains a 3.4 cm diameter simple cyst w/ one thin septation present.  intrauterine device w/in uterus        Other c/o:  denies any pain, bloating, early satiety.  LMP:   05/05/17  - regular, moderate flow, crampy   Last colonoscopy - strongly adv to f/u w/ PCP  Last BDT  Last mammogram   10/2016 NL  Last pap  11/18/16 NO ECC/NL  Last HPV 11/18/16 NEG  H/o abn pap: Y  Marital status: M  Lives: H  Sexually active: Y               Birth control:   IUD   (05/29/2017 21:33)  Added new observation of HEIGHT (CM): 158.75 cm (05/29/2017 21:33)          Vital  Signs     Height: 62.5 in. (158.75 cm.)

## 2017-06-03 ENCOUNTER — Ambulatory Visit

## 2017-06-03 ENCOUNTER — Ambulatory Visit: Admitting: Women's Health

## 2017-06-03 NOTE — Progress Notes (Signed)
GYN Annual Visit         Vital Signs   Weight: 207 lb. (94.09 kg.)  Height: 62.5 in. (158.75 cm.)    Body Mass Index: 37.39  Body Surface Area (m2): 1.95        Blood Pressure #1: 104 / 74 mm Hg (right arm)        Patient in pain? N  No Translator Needed   Colette Durand,[place]......................June 03, 2017 4:24 PM      History of Present Illness:    Chief Complaint: RESULTS OF U/S  HPI: Pt is a 51 YOMF G3P3, LMP of 05/05/2017, last pap smear NO ECC/NL (11/18/2016) , last hpv Negative (11/18/2016) who uses IUD (09/22/13) for birth control.    CC: RESULTS - pt seen for ovarian cysts/pelvic pain  she denies any pain since last menses on 05/05/17.  she feels well today.  pain is diffuse, sharp/crampy associated w/ menses.    Reviewed results.  ? endometriosis ?adenomyosis. discussed pain management.      pt presented to ER 01/2017 with c/o nausea, pelvic pain  TVUS: unremarkable except R ovarian simple cyst measuring 5.4 x 3.6 x 4.9 cm, no L ovarian cyst seen.    03/25/17 TVUS:      Endometrium: 0.4 cm      R Ovary:  NL. previous R ovarian cyst is collapsed/smaller. NL flow      L Ovary:  NL w/ complex cyst measuring 3.9 x 3.4 x 3.7cm      IUD in the endometrial canal.    05/07/17 TVUS:         3.1 x 3.8 x 2.7cm complex cyst seen L ovary w/ low level echoes ?endometrioma. Similar size and appearance             noted prior exam 03/25/2017. similar appearing complex cyst was seen in the R ovary on exam 01/31/2017.         No complex cyst seen in right ovary on this exam. No FF    05/28/17 pelvic MRI:  Uterus is 11 cm in length and 6.3 cm anterior posterior dimension. Assessment of the myometrium shows no differentiation between junctional zone and myometrium and scattered small cysts within the myometrial tissue suggesting diffuse adenomyosis. L ovary is NL in size and contains 2 small follicles.  R ovary contains a 3.4 cm diameter simple cyst w/ one thin septation present.  intrauterine device w/in uterus        Other c/o:   denies any pain, bloating, early satiety.  LMP:   05/05/17  - q 2-3 months moderate flow, crampy   Last colonoscopy - strongly adv to f/u w/ PCP  Last mammogram   10/2016 NL  Last pap  11/18/16 NO ECC/NL  Last HPV 11/18/16 NEG  H/o abn pap: Y  Marital status: M  Lives: H  Sexually active: Y               Birth control:   IUD (09/22/13)    GYN History:   Patient's Age: 51 Years Old  Gravida: 3  Para: 3  NSVD:  3  Menarche (yrs):  12  Length of period: 1  Interval b/n periods: 60-90    Sexual Health Sexually Active? Yes  Satisfied with Sex? Yes  Partners Female  Multiple Partners No  Desire Pregnancy? No  Current Contraceptive:   IUD - inserted 09/22/13    Current Problems - Reviewed Today  URINARY INCONTINENCE (ICD-788.30) (ZOX09-U04)  COUGH (ICD-786.2) (ICD10-R05)  SKIN RASH (ICD-782.1) (ZOX09-U04)  PREVENTIVE HEALTH CARE (ICD-V70.0) (ICD10-Z00.00)  SCREENING, PAP SMEAR, LOW RISK (ICD-V76.2) (VWU98-J19.4)  SLEEP APNEA, OBSTRUCTIVE, SEVERE (ICD-327.23) (ICD10-G47.33)  IRREGULAR MENSTRUATION (ICD-626.4) (ICD10-N92.6)  OBESITY, BMI 35-39.9, ADULT (ICD-278.00) (ICD10-E66.9)  SCREENING FOR DEPRESSION (ICD-V79.0)  VIRAL SYNDROME (ICD-079.99) (ICD10-B34.9)  FAMILY HISTORY OF HYPERTENSION (ICD-V17.49) (ICD10-Z82.49)  OBESITY (ICD-278.00) (ICD10-E66.9)  SNORING (ICD-786.09) (ICD10-R06.83)  ENLARGEMENT OF LYMPH NODES (ICD-785.6) (ICD10-R59.9)  SINUSITIS, ACUTE (ICD-461.9) (ICD10-J01.90)  ACUTE TONSILLITIS (ICD-463)  FEVER (ICD-780.60) (ICD10-R50.9)  DYSMENORRHEA (ICD-625.3) (ICD10-N94.6)      OTHER OVARIAN CYST, RIGHT SIDE (ICD10-N83.291)      OTHER OVARIAN CYST, LEFT SIDE (ICD10-N83.292)      ? ADENOMYOSIS (ICD-617.0) (ICD10-N80.0)    Current Medications - Reviewed Today  FLONASE 50 MCG/ACT NASAL SUSPENSION (FLUTICASONE PROPIONATE) one spary in each nostril  TYLENOL CHEST CONGESTION 500-200 MG/15ML ORAL LIQUID (ACETAMINOPHEN-GUAIFENESIN) Take one tablet by mouth daily  MIRENA (52 MG) 20 MCG/24HR INTRAUTERINE INTRAUTERINE DEVICE  (LEVONORGESTREL)   TRIAMCINOLONE ACETONIDE 0.1 % EXTERNAL CREAM (TRIAMCINOLONE ACETONIDE) apply two times daily as needed to  rash  BENZONATATE 100 MG ORAL CAPSULE (BENZONATATE) 1 cap three times daily as needed for cough  PROAIR HFA 108 (90 BASE) MCG/ACT INHALATION AEROSOL SOLUTION (ALBUTEROL SULFATE) 2 puffs every 4 hours as needed for wheezing  MUCINEX 600 MG ORAL TABLET EXTENDED RELEASE 12 HOUR (GUAIFENESIN) 1 by mouth two times daily for cough/congestion    Current Allergies (No new allergies added) - Reviewed Today  * NO KNOWN DRUG  ALLERGIES.        Past Medical History  No hx of mental illness  functional ovarian cysts, recurrent small simple cysts, on u/s  GERD, vitamin D deficiency    Obstetric History  G3P3 NSVD x 3    Surgical History  denies    Family History  mother - HTN  father - killed at age 6 by terrorists [place]  Sister and brother with asthma  Mat GM - passed away from asthma attack    Denies DM, breast cancer, colon cancer, heart disease    Social History  born [place]  Lives with husband  Has 3 children - one lives in [place], one in [place], one in [place]  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 Yes  Satisfied with Sex? Yes  Partners Female  Multiple Partners No  Violence Screening   Do you feel safe in your current relationship? Yes        Past Pregnancy History      Gravida:  3     Term Births:  3     Premature Births: 3     Para:  3    Review of Systems   General: Complains of see  HPI.   All other pertinent systems were reviewed and are negative       Physical Exam     Constitutional: Alert, no acute distress, well hydrated, well developed, well nourished, appropriate dress,non-ill appearing.       Impression and Recommendations:    PROBLEM  #1:  DYSMENORRHEA:    Discussed imaging results.  MRI ?adenomyosis.  reviewed pathology and clinical presentation.  Hysterectomy is only reliable cure.  Sx tend to resolve by menopause and would not recommend this option based on her age, mostly likely will go through menopause within next few years and risks of procedure.  she is in agreement.    Also reviewed ovarian cysts; Mirena does not consistently prevent ovulation and development of cysts.  Based off imaging reports, cysts appears to be forming and resolving (as they are on different ovaries in the reports).  ?Endometrioma.  Discussed continuous OCPs to prevent cyst formation, help control pain - she declines at this time. Reviewed NSAIDs and hot pad to help with painful periods.  Reviewed recommendation for repeat TVUS in ~3 months to follow up on right ovarian cyst seen on pelvic MRI.  She is in agreement.    PROBLEM  #2:  OTHER OVARIAN CYST, RIGHT SIDE:    repeat TVUS in ~3 months  reviewed importance of follow up      Return to Office   Disposition: return to clinic as above       Patient Instructions:  1)  TVUS in 2-3 months, rto afterwards  2)  go to ER with severe pain, call office with any concerns    Orders:  Added new Service order of Patient Encounter ([id number]) - Signed  Added new Test order of US - Transvaginal (US-TV) - Signed  Added new Service order of OV Est Level III ([id number]) - Signed        Dr. Dorna Bloom provided supervision for this service and is in agreement with the assessment and plan as stated above.     signed Ferd Hibbs, NP    signed Richarda Osmond. Dorna Bloom MD

## 2017-06-05 ENCOUNTER — Ambulatory Visit

## 2017-06-05 NOTE — Progress Notes (Signed)
[facility]   [address]   [place], [place] [address]  Office: [phone] Fax: [phone]     Kelsey Pace ([birth year])       Printed: June 05, 2017        June 05, 2017        Kelsey Pace  [address]  [place], [place]  [address]    Dear Ms. Kelsey Pace:    You are scheduled to have a transvaginal ultrasound @ [facility] on                               Date: 09/03/17  Time: 4:30pm    PLEASE CALL [phone] TO RESCHEDULE ABOVE APPOINTMENT IF IT IS NOT GOOD FOR YOU.    PLEASE ARRIVE 30 MINUTES EARLY TO REGISTER    You are scheduled to return to Dr. Haskell Riling office for a result appointment  Date: 09/10/17  Time: 4:45pm    PLEASE CALL OUR OFFICE @ [phone] TO RESCHEDULE THIS APPOINTMENT IF IT IS NOT A GOOD TIME FOR YOU.    The ultrasound requisition is enclosed. Thank you.          Sincerely,         Richarda Osmond. Dorna Bloom, MD

## 2017-09-09 ENCOUNTER — Other Ambulatory Visit (HOSPITAL_BASED_OUTPATIENT_CLINIC_OR_DEPARTMENT_OTHER): Payer: Self-pay

## 2017-09-09 NOTE — Telephone Encounter (Signed)
[facility] Outreach  Call made to Bonnie Tucker toimprove patient engagementfor a physical exam, for Hypertension, for a breast cancer screening (mammogram) and for a cervical cancer screening (PAP and/or HPV). No interpreter needed.   Patient (self) did not answer at [phone]  this team member left a message asking for a call back.  I have completed the following communication and reminder steps: Added to patient list  Bonnie Tucker, [place], 09/09/2017, 2:39 PM    On behalf of UJW:[id number]:Rishita R. Allena KatzPatel, MD

## 2017-09-10 ENCOUNTER — Ambulatory Visit

## 2017-09-10 NOTE — Progress Notes (Signed)
[facility]   [address]   [place], [place] [address]  Office: [phone] Fax: [phone]  Dr. Richarda Osmond. [name].             September 10, 2017          Kelsey Pace  [address]  [place], [place]  [address]          Dear Ms. Tallie    Our records indicate that you missed your follow up appointment scheduled for ultrasound results on September 10, 2017.    Please call the office to rebook this appointment [phone].        Sincerely,        Sherald Barge, MD

## 2017-09-16 ENCOUNTER — Ambulatory Visit

## 2017-09-16 NOTE — Progress Notes (Signed)
Accessed chart due to refill request from pharm. looks like pt's pcp is Dr. Dorna Bloom who isn't at our office. faxed back to pharmacy to let them know pt is not a pt here anymore        Observations:  Added new observation of ENBSRVTYPENC: Quick Note (09/16/2017 9:22)

## 2017-10-28 ENCOUNTER — Other Ambulatory Visit (HOSPITAL_BASED_OUTPATIENT_CLINIC_OR_DEPARTMENT_OTHER): Payer: Self-pay

## 2017-10-28 ENCOUNTER — Encounter (HOSPITAL_BASED_OUTPATIENT_CLINIC_OR_DEPARTMENT_OTHER): Payer: Self-pay

## 2017-10-28 NOTE — Telephone Encounter (Signed)
Planned Care Outreach    Call made to Bonnie PaganiniMaria Ester Tucker to schedule an appointment  for a cervical cancer screening (PAP and/or HPV) and for Asthma. No interpreter needed.  Patient (self) answered at [phone]; PATIENT STATES SHE HAS SWITCHED HER INSURANCE AND IS NO LONGER A [facility] PATIENT..     I have completed the following communication and reminder steps: Patient notified by telephone  Cottie BandaElizabeth Burgos, 10/28/2017, 2:04 PM    On behalf of PCP: Rishita R. Allena KatzPatel, MD

## 2017-11-27 ENCOUNTER — Ambulatory Visit

## 2017-11-27 ENCOUNTER — Ambulatory Visit: Admitting: Physician Assistant

## 2017-11-27 LAB — HX CBC W/DIFF
HX ABSOLUTE BASO COUNT AUTODIFF: 0.02 10*3/uL (ref 0.0–0.22)
HX ABSOLUTE EOS COUNT AUTODIFF: 0.42 10*3/uL (ref 0.0–0.45)
HX ABSOLUTE LYMPHS COUNT AUTODIFF: 1.11 10*3/uL (ref 0.74–5.04)
HX ABSOLUTE MONO COUNT AUTODIFF: 0.62 10*3/uL (ref 0.0–1.34)
HX ABSOLUTE NEUTRO COUNT AUTODIFF: 5.44 10*3/uL (ref 1.48–7.95)
HX BASOPHIL AUTOMATED: 0.3 %
HX EOSINOPHIL AUTOMATED: 5.5 %
HX HEMATOCRIT: 40.8 % (ref 36.0–47.0)
HX HEMOGLOBIN: 13.5 g/dL (ref 11.8–16.0)
HX IG AUTOMATED: 0.4 % (ref 0.0–2.0)
HX LYMPHOCYTE AUTOMATED: 14.5 %
HX MEAN CORP.HEMO.CONC.: 33.1 g/dL (ref 31.0–37.0)
HX MEAN CORPUSCULAR HEMOGLOBIN: 31.3 pg (ref 26.0–34.0)
HX MEAN CORPUSCULAR VOLUME: 94.4 fL (ref 80.0–100.0)
HX MEAN PLATELET VOLUME: 9.2 fL — ABNORMAL LOW (ref 9.4–12.4)
HX MONOCYTE AUTOMATED: 8.1 %
HX NEUTROPHIL AUTOMATED: 71.2 %
HX PLATELET COUNT: 197 10*3/uL (ref 150.0–400.0)
HX RED BLOOD COUNT: 4.32 M/uL (ref 3.9–5.2)
HX RED CELL DISTRIBUTION WIDTH SD: 44 fL (ref 35.0–51.0)
HX WHITE BLOOD COUNT: 7.6 10*3/uL (ref 4.5–11.0)

## 2017-11-27 LAB — HX COMPREHENSIVE METABOLIC PANELX
HX ALBUMIN: 4.1 g/dL (ref 3.2–4.9)
HX ALKALINE PHOSPHATASE: 87 U/L (ref 30.0–117.0)
HX ALT: 20 U/L (ref 0.0–31.0)
HX ANION GAP: 10 mmol/L (ref 9.0–19.0)
HX AST: 22 U/L (ref 0.0–31.0)
HX BICARBONATE: 25 mmol/L (ref 22.0–29.0)
HX BUN/CREAT RATIO: 14 (ref 12.0–20.0)
HX BUN: 9 mg/dL (ref 6.0–20.0)
HX CALCIUM: 9.1 mg/dL (ref 8.5–10.5)
HX CHLORIDE: 105 mmol/L (ref 98.0–110.0)
HX CREATININE: 0.7 mg/dL (ref 0.4–1.2)
HX GLOMERULAR FILTRATION RATE: 88
HX GLUCOSE: 101 mg/dL — ABNORMAL HIGH (ref 70.0–100.0)
HX HEMOLYSIS INDEX: 14 mg/dL (ref 0.0–50.0)
HX ICTERIC INDEX: 1 (ref 0.0–2.0)
HX LIPEMIC INDEX: 9 mg/dL (ref 0.0–40.0)
HX POTASSIUM: 3.6 mmol/L (ref 3.5–5.1)
HX SODIUM: 140 mmol/L (ref 137.0–146.0)
HX TOTAL BILIRUBIN: 0.5 mg/dL (ref 0.2–1.2)
HX TOTAL PROTEIN: 6.9 g/dL (ref 6.5–8.4)

## 2017-11-27 NOTE — Telephone Encounter (Signed)
Phone Note -       Call back at Ph1   Initial call taken by: [name], MD,  November 27, 2017 9:14 PM  Initial Details of Call:  Pt with fevers/chills/severe joint pain.  In bed all day, temp was 103 this morning, taking high dose ibuprofen with no improvement in her fever.    2 weeks ago pt started to have joint pain, headaches, dizziness, cough.  Suddenly got worse 2 days ago, and started to have fevers (Tmax 103) that have not been responding to ibuprofen.  Reports a "pulsing pain" in suprapubic area, severe headache, and is feeling so ill she hasn't been out of bed all day.  Advised pt go to ED.    Nursing please call to check in on pt - can call Byrd Hesselbach at number in chart, or her daughter Porfirio Mylar [phone]      Follow-up #1  Details: Pt seen @ [facility] 11/27/17 for Flu Symptoms.  Attempted to contact Pt.  On return call, Please ask Pt how she is doing.  Offer Follow-Up, though Pt has not been seen here since 02/03/17, Previous pt of Dr. Elisabeth Most. Who is Pt's PCP now? Also need to Update current Insurance.  Action: Left Message for Patient  By: Kalman Drape RN ~ November 28, 2017 8:15 AM    Details: She is changing to Dr. Vear Clock. Aware she needs to tell her insurance     [employer] EPO: [id number]  Updated insurance in EMR    Patient would like to call for a SDS next week as she needs to call her insurance first and find child care  Routing to new PCP as an FYI  By: Lazaro Arms, [place] ~ November 28, 2017 12:57 PM    Follow-up #2  Details: noted  Action: Phone call completed  By: [name], MD ~ November 28, 2017 1:58 PM

## 2017-11-28 ENCOUNTER — Ambulatory Visit

## 2018-02-20 ENCOUNTER — Ambulatory Visit

## 2018-02-20 NOTE — Progress Notes (Signed)
Preround appt 02/25 - CPE    Flu  Mammogram    SP  pap NILM HPV neg 05-28-2016  Tdap 05-29-2015  colo stated 12/31/2011 however I cannot find records, will ask where procedure done and get record  check lipids, offer FBS    Did you self refer to any specialists?    Age: 52 Years Old  Last Secure Message:  04/02/2017 - Secure Messaging: Patient Portal Pin Creator  Last PIN change:  04/02/2017         Labs   No eye exam in record! LDL 88 on 11/27/2016 Triglycerides 208 on 11/27/2016 HGBA1C =       Most Recent Visits     (Last rx/phone contact: 11/28/2017 )        06/03/2017  Paige H. Nunes NP (.HPI)    05/14/2017  Paige H. Nunes NP (.HPI)    04/02/2017  Paige H. Nunes NP (.HPI)                                      Next Appointment(s) 02/23/2018  [facility]  12:30 PM      Immunizations   No flu vaccine this season     Hepatitis B  11/28/2015      Hepatitis A  11/28/2015      Td Booster 01/05/2007  TDAP 10/31/2015   Last Flu  11/18/2016    typhim vaccine   11/28/2015   done       Mammogram:  Date of Exam:  Date of Exam: .   PAP: NO ECC/NormalDate of Exam: 11/18/2016.   Bone Density:  Date of Exam:    Colonoscopy: normal -rpt 10 yrs Date of Exam:  12/31/2011.  FIT Test:  Date:    Hemoccult:  Date:  .    Protocols Due  FLU VAX, MAMMOGRAM.               Diagnosis codes used in May 28, 2016, not billed for in May 28, 2017  DYSMENORRHEA (ICD10-N94.6), OTHER OVARIAN CYST, RIGHT SIDE (ICD10-N83.291), ? ADENOMYOSIS (ICD10-N80.0), OTHER OVARIAN CYST, LEFT SIDE (ICD10-N83.292)    Directives   The above represents the pre-visit preparations conducted for this patient.

## 2018-02-26 ENCOUNTER — Ambulatory Visit

## 2018-02-26 NOTE — Telephone Encounter (Signed)
Phone Note -     Patient    Routine    Call back at [phone]  Initial call taken by: Lenell Antu,  February 26, 2018 12:05 PM  Initial Details of Call:  Patient called regarding her insurance, she states that she called her insurance company prior and her insurance company did verify she is active although, she is still inactive in passport. She was unable to provide a reference number. Patient was looking to book an appointment advised patient that we cannot until her insurance is active, patient is calling her insurance again to clarify gave the patient Dr. [name] NPI number and to call us back once her insurance is straighten out.     Unable to verify insurance for: NEOMI LAIDLER, 10/23/1966  Insurance ID # [id number]  Insurance name: Blue cross blue shield  Phone number on back of card: [phone]  Address to send medical claims to: n/a        Follow-up #1  Details: checked insurance still not eligible with BCBS  By: Everette Rank [place] ~ March 04, 2018 2:53 PM

## 2018-06-04 ENCOUNTER — Ambulatory Visit

## 2018-06-04 NOTE — Telephone Encounter (Signed)
Phone Note -     Patient    ***URGENT***    Call back at [phone]  Initial call taken by: Lenell Antu (Patient Access Center),  June 04, 2018 10:19 AM  Initial Details of Call:  Patient calling in stating she has been getting the worst headaches going on for about a week now and it starts at 2 am and 3 am also, has been having dizziness.       Follow-up #1  Details: Call received from [facility]. Pt. states calling to schedule a CPE and having bad headaches with dizziness x1 week. Pt. states the headache occur at 2-3 am each day. Pain is 10/10. Pt. also states it is accompanied by dizziness and extreme fatigue.  Pt. denies fever and/or chills. Denies nausea and/or vomitting. Pt. states she takes Ibuprofen and uses an ice pack and it subsides greatly, but comes back the next day. Pt. offered an appt. to be seen today, 06/04/18, but pt. declined since Dr. Vear Clock is not in the office today and does not want to see any other provider. Pt. requested to see Dr. Vear Clock tomorrow, 06/05/18. Dr. Vear Clock avail for a CPE 06/05/18, pt. scheduled for that appt. at 3:45 pm. Pt. advised if h/a  worsens, please call office and  an appt. can be scheduled for today. Routing to [place], Cassie and Dr. Vear Clock as Lorain Childes.     By: Mathis Bud RN ~ June 04, 2018 10:47 AM    Details: noted routing to Dr. Vear Clock  By: Manfred Shirts [place] ~ June 04, 2018 11:13 AM    Follow-up #2  Details: noted  By: [name], MD ~ June 04, 2018 1:38 PM

## 2018-06-05 ENCOUNTER — Ambulatory Visit

## 2018-06-05 NOTE — Progress Notes (Signed)
a    Primary Provider:  [name], MD      History of Present Illness:  Kelsey Pace is a 52 year old Female who presents today for: Est Patient CPE    Specialists seen since last visit?  Has previsit planning and a huddle been performed on this patient?    protocols due;  mammogram    Visit conducted in Spanish    Headache  --For > 1 week has been suddenly waking up at 2AM with severe HA  --Associated with feeling like eyes are "inflamed"  --Denies nausea, vomiting, blurred vision  --Improves with ibuprofen, strong coffee and a cold compress but still feels like something heavy is on her head  --No pain during the day  --Generally feels weak, cares for her grandchildren and yesterday was too tired to play with her grandchildren    Redness on her upper chest  --Will be itchy some days  --Worried that it may signify she has cancer  --Not related to being in the sun or the heat, seems to be more itchy when it's cold  --Was previously given a cream for it which improved it but skin always seems darker    Obesity  --Trying to eat more healthfully  --In the past tried weight watchers, lost 15 pounds, then gained it back when she stopped going      Past Medical History  functional ovarian cysts, recurrent small simple cysts, on u/s  GERD, vitamin D deficiency  Surgical History  denies  Family History  mother - HTN, pacemaker for arrhythmia  father - killed at age 34 by terrorists in [place]  Sister and brother with asthma, brother HTN  Mat GM - passed away from asthma attack  Denies DM, breast cancer, colon cancer, heart disease  Social History  born [place]  Lives with husband, daughter  Has 3 children - one lives in [place], one in [place], one in [place]  occ: not currently working, caring for her grandchildren, previously custodial  safe at home  non-smoker    Risk Factors  Tobacco User: no  Smoking Status: never smoked  Passive smoke exposure: No    Drug use: no  Alcohol use: no    Exercise: Yes  Times  per week: 2  Exercise Comments:  active with grandchildren, used to be more active when she was working    Caffeine (drinks/day): 0-1  Sun exposure: occasionally, wears sunscreen  Seatbelt use (%): 100  Family History MI in female age < 49: no  Family History MI in female age < 65: no        Review of systems  Denies chest pain, shortness of breath, lower extremity edema, palpitations, vision changes, headache, dizziness, joint pain, muscle pain, nausea, vomiting, diarrhea, BRBPR    Vital Signs     Patient: 52 Years Old Female  Height:  62.5 in.  Weight: 215 lbs      Wt Chg: 8 since 06/03/2017  BMI:  38.84        37.39 on 06/03/2017  BP:  124/86 left arm, large cuff, seated     104/74 on 06/03/2017   Temp:  98.01  F    oral  Pulse:  72       regular    Patient is not experiencing pain    Medications and Allergies Reviewed    Signed: Manfred Shirts [place]....June 05, 2018 3:40 PM      Mini-HRA  1. Has your physical and emotional health limited your social activities with family, friends, neighbors, or groups?  1  - Never      Venipuncture   Venipuncture Completed      Physical Exam  General: Well developed, well nourished, in no acute distress.  Head: Normocephalic and atraumatic.    Eyes: PERRL/EOM intact, conjunctiva and sclera clear with out nystagmus.    Ears: TM's intact and clear with normal canals with grossly normal hearing.   Nose: No deformity, discharge, inflammation, or lesions.   Mouth: No deformity or lesions with good dentition.    Neck: No masses, thyromegaly, or abnormal cervical nodes.    Lungs: Clear bilaterally to auscultation.    Heart: Regular rate and rhythm, S1, S2 without murmurs, rubs, or gallops  Abdomen: Normal bowel sounds; soft, NT, ND  Msk: Full ROM   Extremities: No edema  Neurologic: No focal deficits, cranial nerves II-XII grossly intact with normal sensation, reflexes, coordination, muscle   strength and tone.    Skin: Hyperpigmented area on upper chest, 1.5 cm round macule on left  upper chest  Psych: Alert and cooperative; normal mood and affect; normal attention span and concentration.         Assessment and Plan:      ~PREVENTIVE HEALTH CARE    pap NILM HPV neg 2017  Tdap 2016  colo recorded as 12/31/2011 in [facility] record normal, pt thinks she had procedure at [facility] but is not in the record  check lipids, A1c  mammogram ordered  Order(s): LIPID -Lipid Panel**, GLYCO-A1C**  Order(s): Mammogram-Screening **     ~HEADACHE    Per chart pt has a history of headaches however she reports never being woken from sleep in the past with HA.  No focal neurological deficits however severe headache waking from sleep warrants MRI especially in someone >50 yrs old.  Order(s): MRI-Brain w/o and with Contrast     ~SKIN LESION    Order(s): Dermatology Referral     ~OBESITY, BMI 35-39.9, ADULT    discussed trying WW again or trying my fitness pal or other tracking app      Problems Reviewed    Medications Removed Today:   FLONASE 50 MCG/ACT NASAL SUSPENSION (FLUTICASONE PROPIONATE) one spary in each nostril  TYLENOL CHEST CONGESTION 500-200 MG/15ML ORAL LIQUID (ACETAMINOPHEN-GUAIFENESIN) Take one tablet by mouth daily  BENZONATATE 100 MG ORAL CAPSULE (BENZONATATE) 1 cap three times daily as needed for cough; Route: ORAL  MUCINEX 600 MG ORAL TABLET EXTENDED RELEASE 12 HOUR (GUAIFENESIN) 1 by mouth two times daily for cough/congestion; Route: ORAL      Medications:  Cancelled MUCINEX 600 MG ORAL TABLET EXTENDED RELEASE 12 HOUR (GUAIFENESIN) 1 by mouth two times daily for cough/congestion  #30[Tablet] x 0   Route:ORAL   Entered by: Manfred Shirts [place]   Authorized by: Roderic Palau MD   Signed by: [name], MD on 06/05/2018   Method used: Electronically to      [id number]* (retail)     [address]     [place], [place]  [address]     Ph: [phone]     Fax: [phone]   RxID: [id number]  Cancelled TYLENOL CHEST CONGESTION 500-200 MG/15ML ORAL LIQUID (ACETAMINOPHEN-GUAIFENESIN) Take one tablet by  mouth daily  #60[Unspecified] x 0   Entered by: Manfred Shirts [place]   Authorized by: Joanna Puff MD   Signed by: [name], MD on 06/05/2018   Method used: Electronically to  [id number]* (retail)     [address]     [place], [place]  [address]     Ph: [phone] or [phone]     Fax: [phone]   RxID: [id number]  Cancelled BENZONATATE 100 MG ORAL CAPSULE (BENZONATATE) 1 cap three times daily as needed for cough  #30[Capsule] x 0   Route:ORAL   Entered by: Manfred Shirts [place]   Authorized by: Roderic Palau MD   Signed by: [name], MD on 06/05/2018   Method used: Electronically to      [facility] [phone]* (retail)     [address]     [place], [place]  [address]     Ph: [phone]     Fax: [phone]   RxID: [id number]  Cancelled FLONASE 50 MCG/ACT NASAL SUSPENSION (FLUTICASONE PROPIONATE) one spary in each nostril  #1[Unspecified] x 0   Entered by: Manfred Shirts [place]   Authorized by: Joanna Puff MD   Signed by: [name], MD on 06/05/2018   Method used: Electronically to      [id number] (retail)     [address]     [place], [place]  [address]     Ph: [phone] or [phone]     Fax: [phone]   RxID: [id number]              Health Risk Assessment     In the past four weeks?   1. Has your physical and emotional health limited your social activates with family, friends, neighbors or groups?  1  - Never  2. Have you needed help preparing your own meals?  1  - Never  3. Have you needed help with transportation?  (for example, can you travel alone on buses or drive your own car?)  1  - Never  4. Are you having difficulties driving your car?  1  - Never  5. Have you needed help with shopping for groceries or clothes?  1  - Never  6. Have you needed help with taking your medications?  1  - Never  7. Have you needed help with managing your finances?  1  - Never  8. Have you needed help with household chores?:  1  - Never  9. Was someone available to assist you if  needed or wanted help?  (for example, help with daily chores; if you felt lonely; or got sick)  1  - Never  12. Have you had any sexual problems?  1  - Never  13. Do you eat healthy foods daily?  5  - Always  14. Have teeth or denture problems?  1  - Never  15. Have problems using a telephone?  1  - Never  16. Have tiredness or fatigue?  1  - Never  17. How much bodily pain have you generally had?  4  - Often    Do you live alone? No  Does your home have: Throw rugs? No  Does your home have: Poor lighting? No  Does your home have: A slippery tub? No  Does your home have: Grab bars in bathroom? No  Does your home have: Handrails on stairs or steps? No  Does your home have: Functioning smoke alarms? Yes  Does your home have: Functioning carbon monoxide alarms? Yes    During the past four weeks, how would you rate your health in general? Very Good  During the past four weeks, how have things been going for you? Pretty Well  How confident are you that you can control and manage most of your health problems? Very Confident

## 2018-06-06 LAB — HX GLYCOHEMOGLOBIN
HX ESTIMATED AVERAGE GLUCOSE: 123 mg/dL
HX HEMOGLOBIN A1C: 5.9 % — ABNORMAL HIGH (ref <=5.6)

## 2018-06-06 LAB — HX LIPID PANEL FASTING
HX CHOLESTEROL (LIPR): 196 mg/dL (ref <=200)
HX HDL CHOLESTEROL: 39 mg/dL — ABNORMAL LOW (ref >=40)
HX LDL CHOLESTEROL: 94 mg/dL (ref <=130)
HX TRIGLYCERIDES: 313 mg/dL — ABNORMAL HIGH (ref <=150)

## 2018-06-08 ENCOUNTER — Ambulatory Visit

## 2018-06-08 NOTE — Progress Notes (Signed)
Dermatology referral        Appointment on Monday June17th at 11:00am (10:45 arrival)  with Raiford Simmonds, PA (referral under [place])  [address]  [place], [place] [address]  [phone]   Fax:[phone]       Patient informed    Insurance is active    Referral sent to be processed  ..................................................................Marland KitchenParesa Gennigiorgis  June 08, 2018 4:30 PM      Patients date of birth is incorrect with insurance. Notified patient and central referrals.  ..................................................................Marland KitchenParesa Gennigiorgis  June 09, 2018 9:12 AM    patient called back informed that she changed her DOB with insurance  should be all set within 24 hours     Routing to [employer] for FYI  ..................................................................Marland KitchenEverette Rank [place]  June 09, 2018 10:29 AM    Noted and message sent to central referrals  ..................................................................Marland KitchenParesa Gennigiorgis  June 09, 2018 10:31 AM      no referral required  ..................................................................Marland KitchenParesa Gennigiorgis  June 10, 2018 1:54 PM  Observations:  Added new observation of ENBSRVTYPENC: Quick Note (06/08/2018 16:20)

## 2018-06-09 ENCOUNTER — Ambulatory Visit

## 2018-06-09 NOTE — Telephone Encounter (Signed)
Phone Note -       Initial call taken by: Manfred Shirts [place],  June 09, 2018 8:34 AM  Initial Details of Call:  attempted to start prior authorization for patient to have an MRI of the brain without and with contrast. Prior auth through [facility] [phone]. unable to start PA as patient's Date of birth through insurance is different than ours on file. confirmed with patient ours is correct. asked patient to give her insurance a call to change her date of birth. asked her to give [place] a call back once it is all set. patient understands.      note for myself; patient prefers afternoons at [facility] for MRI.      Follow-up #1  Details: Patient states that she called her insurance to change her date of birth. Patient states that her insurance told her it would take 24 hours to show up in the system.    Transferred call to the office.    Patient can be reached at [phone].  By: Emelda Fear (Patient Access Center) ~ June 09, 2018 10:25 AM    Details: patient transferred to me , let her know we should be all set and hopefully have this done for her soon .     She would like the latest appt available in the afternoon        Routing to Cassie   By: Everette Rank [place] ~ June 09, 2018 10:30 AM    Follow-up #2  Details: new phone note opened. closeing this one. for further updates see next phone note.  By: Manfred Shirts [place] ~ June 11, 2018 12:44 PM

## 2018-06-10 ENCOUNTER — Ambulatory Visit

## 2018-06-11 ENCOUNTER — Ambulatory Visit

## 2018-06-11 NOTE — Telephone Encounter (Signed)
Phone Note -       Initial call taken by: [name], MD,  June 11, 2018 8:59 AM  Initial Details of Call:  Please let her know  1) Blood sugar very slightly elevated.  Does not have diabetes but is at risk of developing diabetes in the next few years.  Losing weight will help prevent her from developing diabetes, as well as diet changes.  2) Cholesterol is good except for one measure called triglycerides, which is high.  This is treated with diet change.  We can send information on foods to avoid to improve triglycerides, or if she puts into google "[facility] triglycerides" she will find good information      Follow-up #1  Details: Spoke with patient and results relayed.  No further questions at this time.    Triglycerides infomartion mailed to patient per her request.  By: Claretta Fraise [place] ~ June 11, 2018 9:08 AM

## 2018-06-11 NOTE — Telephone Encounter (Signed)
Phone Note -       Initial call taken by: Claretta [place],  June 11, 2018 9:05 AM  Initial Details of Call:  MRI ORDERED    Pacemaker? NO  Metal in body? NO  any hx of cancer? NO  are you claustrophobic? NO  surgery on part of body you need the MRI for? NO  any chance of pregnant? NO  Days and times you prefer? LATE AFTERNOON, ANYDAY OF THE WEEK        Follow-up #1  Details: attempted to prior authoriza MRI for second time yesterday, was told they are unable to do anything as we have a differnt birthday than her insurance. closing previous phone note as this one is opened.  By: Manfred Shirts [place] ~ June 11, 2018 12:42 PM    Details: spoke to [employer] specialty health representitive Edison Simon. at 3:24pm est time 06/12/2018. he confirmed patient DOES NOT need a PA.    MRI OF BRAIN- WITH AND WITHOUT CONTRAST scheduled for JUNE 25 AT 4:00PM   PATIENT IS TO ARRIVE AT 3:30PM TO THE [facility]  [address] [place] on the second floor     patient is aware of the above    routing to Dr.Phillips FYI  By: Manfred Shirts [place] ~ June 12, 2018 2:42 PM

## 2018-06-15 ENCOUNTER — Ambulatory Visit

## 2018-06-24 ENCOUNTER — Ambulatory Visit

## 2018-06-24 NOTE — Telephone Encounter (Signed)
Phone Note -       Initial call taken by: Brantley Fling,  June 24, 2018 3:11 PM  Initial Details of Call:  [facility]   Spoke to Gloris [place]     It was booked with and without contrast for HA.  MD there is suggesting without contrast.   They would like a new order.    Appointment @ 4PM     P: [phone]   F: [phone]       Follow-up #1  Details: new order entered will have [place] fax  By: [name], MD ~ June 24, 2018 3:50 PM    Details: faxed and spoke to [facility] who did not recieve yet, refaxed  By: Manfred Shirts [place] ~ June 24, 2018 4:08 PM    Follow-up #2  Details: spoke to christie who confirmed patient was scanned and order was recieved  By: Manfred Shirts [place] ~ June 25, 2018 9:05 AM          Orders:  Added new Test order of MRI- Brain Without Contrast Horatio Pel) - Signed

## 2018-06-25 ENCOUNTER — Ambulatory Visit

## 2018-06-25 NOTE — Telephone Encounter (Signed)
Phone Note -       Initial call taken by: [name], MD,  June 25, 2018 11:24 AM  Initial Details of Call:  Please let her know the MRI of her brain was normal so nothing concerning that could explain her headaches.  If they are continuing to bother her I would recommend she see a neurologist, let me know if she wants me to refer      Follow-up #1  Details: Spoke with pt and she continues having headaches.    Already saw neurologist at [facility] on June 17th, her name is Martinique, pt does not remember her last name.    Pt stated that she will try to reach her Neurologist to schedule a f/u appt.    Routing to Dr. Vear Clock as Lorain Childes.   By: Irean Hong [place] ~ June 25, 2018 12:14 PM

## 2018-07-14 ENCOUNTER — Ambulatory Visit

## 2018-08-03 ENCOUNTER — Ambulatory Visit

## 2018-08-04 ENCOUNTER — Ambulatory Visit

## 2018-08-04 NOTE — Telephone Encounter (Signed)
Phone Note -     Patient    Routine    Call back at [phone]  Initial call taken by: Jonita Albee (Patient Access Center),  August 04, 2018 1:40 PM  Initial Details of Call:  Patient would like a call in regards to her results for her US done yesterday in [place]  Contact [phone]      Follow-up #1  Details: [place] results signed  routing to PCP to advise  By: Arlice Colt ~ August 04, 2018 1:57 PM    Details: spoke w pt, relayed results of [place] which show a complex cyst, likely benign, rpt 6 mos  By: [name], MD ~ August 04, 2018 2:21 PM          Problems:  Added new problem of ABNORMAL BREAST IMAGING CYST REPEAT SONO 01/2019 (ICD-793.89) (ICD10-R92.8)

## 2019-02-01 ENCOUNTER — Ambulatory Visit

## 2019-02-01 NOTE — Telephone Encounter (Signed)
 Phone Note -       Initial call taken by: [name], MD,  February 01, 2019 7:26 PM  Initial Details of Call:  Please call pt to let her know she is due for a repeat ultrasound and mammogram of her breast - does she already have it scheduled? Thanks      Follow-up #1  Details: spoke to [place] at [facility], patient is already scheduled for repeat u/s of breast and mammogram at the breast center 02/09/2019 9:50AM, spoke to patient and she is aware.   By: Manfred Shirts [place] ~ February 02, 2019 2:42 PM

## 2019-02-09 ENCOUNTER — Ambulatory Visit

## 2019-04-29 ENCOUNTER — Ambulatory Visit

## 2019-07-20 ENCOUNTER — Ambulatory Visit

## 2019-07-21 ENCOUNTER — Ambulatory Visit: Admitting: Family Medicine

## 2019-07-21 NOTE — Progress Notes (Signed)
 [facility]   [address]  [place], [place] [address]  Phone: [phone]  Fax: [phone]       July 21, 2019      Kelsey Pace  [address]    [place], [place] [address]        RE:  TEST RESULTS    Dear [name]:    I have carefully reviewed your most recent test results, and below is a summary of my findings.  Please take note of any instructions provided.      Dr. Vear Clock is not in office this week so I am covering for her today. I am pleased to tell you that your recent mammogram was normal. I recommend that you have another mammogram in one year.    Please contact our office with any questions or concerns.      Sincerely,    Unice Bailey, DO

## 2019-08-14 ENCOUNTER — Ambulatory Visit

## 2019-08-14 ENCOUNTER — Ambulatory Visit: Admitting: Family Medicine

## 2019-08-14 ENCOUNTER — Ambulatory Visit: Admitting: Emergency Medicine

## 2019-08-14 NOTE — Telephone Encounter (Signed)
 Phone Note -       Initial call taken by: Denyse Amass MD,  August 14, 2019 9:08 PM  Initial Details of Call:  oncall, fever fatigue and cough  daughter tested positive for COVID, she cares for her children  rec asap covid testing, presume positive, quarantine  rec clinical eval at [facility]

## 2019-08-16 ENCOUNTER — Ambulatory Visit

## 2019-08-16 NOTE — Telephone Encounter (Signed)
 Phone Note -       Initial call taken by: [name], MD,  August 16, 2019 6:07 AM  Initial Details of Call:  Pt seen in urgent care over weekend with COVID symptoms (exposure from daughter), results not back yet.  Please call to check in on symptoms, if not improved please offer telehealth appt      Follow-up #1  Details: Feeling a little better, after being exposed. Patient is experiencing body aches, sore throat, headaches.     Patients callback number is [phone] (other given number does not work anymore)   please call this given number to give results.  By: Eugenie Filler (Patient Access Center) ~ August 16, 2019 8:46 AM    Details: Called Patient and spoke with Daughter Victorino Dike.  Her Mom was exposed to Sister  Seen @ [facility] and tested. Symptoms have improved.  Please call as son as results available  Pt was tested Saturday  Routing to Dr.Rozella Servello.    By: Kalman Drape RN ~ August 16, 2019 10:08 AM    Follow-up #2  Details: please advise pt test came back negative.  However given her positive exposure to a known contat with COVID and her symptoms she needs to quarantine for 14 days from last contact with COVID positive person  By: [name], MD ~ August 17, 2019 6:06 AM    Details: Patient called to get covid test results.  Patient can be reached at# [phone]  By: Pat Kocher (Patient Access Center) ~ August 17, 2019 9:08 AM    Follow-up #3  Details: Relayed result and Quarantine instructions to Pt. She verbalized understanding. Signing to Pt Chart.  By: Madilyn Hook [place] ~ August 17, 2019 9:21 AM

## 2019-08-17 LAB — HX COVID 19 ~~LOC~~: HX COVID 19 RESULT ~~LOC~~: NEGATIVE

## 2019-10-29 ENCOUNTER — Ambulatory Visit

## 2019-10-29 NOTE — Telephone Encounter (Signed)
 Phone Note -     Patient    Routine    Initial call taken by: Reita Chard (Patient Access Center),  October 29, 2019 12:40 PM  Initial Details of Call:  Patient booked on: DATE 11/2 @ 10AM     Patient made aware they will be receiving a call morning of their appointment to be triaged for any COVID related symptoms.      Patient provided with pick-up line [phone] and told to call that number upon arrival to [facility].     Patient is advised to wear a short sleeve shirt or any loose clothing that can be easily pulled up to administer their vaccine.    If patient has any questions or concerns regarding their appointment they should call the office number at [phone].        Follow-up #1  Details: Noted, thank you  By: Lazaro Arms, [place] ~ October 29, 2019 12:58 PM

## 2019-11-01 ENCOUNTER — Ambulatory Visit

## 2019-11-01 NOTE — Telephone Encounter (Signed)
 Phone Note -       Initial call taken by: Lizabeth Leyden,  November 01, 2019 8:30 AM  Initial Details of Call:  We are asking you to attest for your own safety and the safety of our staff.    Fever  SOB  Headaches  New loss of smell/taste  Fatigue  New cough(not related to chronic condition)  New nasal congestion or runny nose (not related to seasonal allergies)  Muscle aches  Aches and Pains  Sore throat  Diarrhea  Have you been tested for COVID?  If so, Where (facility) ?  Date?  Covid Result:    Positive / Negative    lm for patient to call back to answer attestation questions. Please provide pickup line [phone] for patient when they arrive.               Follow-up #1  Details: We are asking you to attest for your own safety and the safety of our staff.    Fever-no  SOB-no  Headaches-no  New loss of smell/taste-no  Fatigue-no  New cough(not related to chronic condition)-no  New nasal congestion or runny nose (not related to seasonal allergies)-no  Muscle aches-no  Aches and Pains-no  Sore throat-no  Diarrhea-no  Have you been tested for COVID? yes  If so, Where (facility) ? [facility]  Date?2 or 3 months ago  Covid Result:    Negative   pt has been given the p/up # to call when arrivinng at [facility]   Ins active in clearwave   Signing off on note   By: Nelda Bucks. Marlar [place] ~ November 01, 2019 9:12 AM

## 2019-11-01 NOTE — Progress Notes (Signed)
 Vital Signs     Patient: 53 Years Old Female  Height:  62.5 in.  Prev Weight:  215 lbs   BMI: 38.84 on 06/05/2018   Old BP:  124/86 on 06/05/2018            Vaccines Ordered: Flu IM  Ordering Provider:  [name]    Vaccines Given  Flu IM     Dose:  0.5 mL  Given By:  Lazaro Arms, [place]  Manufacturer:  [employer]     Lot Number:  [id number]     Expiration Date:  06/28/2020  Vaccine Type:  Influenza - Unspecified Formulation [CVX88]  NDC Number:  [id number]  VIS Version Provided, Date:  04/29/2018     Source:  Private Purchase  Route:  IM     Site:  Left Deltoid          Clinical Lists Changes    Orders:  Added new Service order of Flu Vac ([id number]) - Signed  Added new Service order of Imm. Admin; single or combo vaccine (58850) ([id number]) - Signed  Observations:  Added new observation of FLU VAX#1NDC: [id number]  (11/01/2019 10:40)  Added new observation of FLU VAXSRC: Private Purchase  (11/01/2019 10:40)  Added new observation of FLU VAXRTE: IM  (11/01/2019 10:40)  Added new observation of FLU VAX#1MFR: Sanofi Pasteur  (11/01/2019 10:40)  Added new observation of FLU VAX EXP: 06/28/2020  (11/01/2019 10:40)  Added new observation of FLU VAX VIS: 04/29/2018  (11/01/2019 10:40)  Added new observation of FLU VAX: Flu IM  (11/01/2019 10:40)  Added new observation of VAXNAME: no vaccines pending  (11/01/2019 10:40)  Added new observation of ENBSRVTYPENC: Nurse Visit  (11/01/2019 10:40)  Added new observation of VFC ELIGIBLE: Not VFC Eligible  (11/01/2019 10:40)  Added new observation of HEIGHT: 62.5 in (11/01/2019 10:40)  Added new observation of HEIGHT (CM): 158.75 cm (11/01/2019 10:40)

## 2019-11-03 ENCOUNTER — Ambulatory Visit

## 2019-11-03 LAB — HX THROAT GROUP A STREP (PCR): HX THROAT GROUP A STREP (PCR): NEGATIVE

## 2019-12-15 ENCOUNTER — Ambulatory Visit

## 2019-12-16 ENCOUNTER — Ambulatory Visit

## 2019-12-16 NOTE — Telephone Encounter (Signed)
 Phone Note -       Initial call taken by: [name], MD,  December 16, 2019 5:32 AM  Initial Details of Call:  Pt reported dizziness in secure message- please try to get her in for OV as soon as possible, ok if not with me      Follow-up #1  Details: Left message to call office   Dr Dia Sitter has openings tomorrow    By: Margit Banda [place] ~ December 16, 2019 12:42 PM    Details: TTC. Patient called back and can be reached at [phone].  By: Pryor Curia (Patient Access Center) ~ December 16, 2019 12:52 PM    Follow-up #2  Details: When did it start: Sunday   Sensation of room spinning: yes   Worse with change in head or body position changes:  Headaches: not headache but head feels heavy, pressure in forehead  Light sensitivity: no but feels back of eyes "something moving"  Vision changes:  Head trauma:no  Changes in hearing:no  Hx vertigo:no  Ear pain?: no  Recent URI, sinusitis:no  New meds, injections:no  Oriented to date, time, person: yes  When did you last drink fluids:breakfast and reports eating and drinking well.   improve with OTC meds/remedy:   Has been going to kickbox for 1 year and now cant go because of the lightheadedness and feels fatigued   Pt reports sx's are more in am but stays with her.  Advised pt to have family member drive her to appt for safety.  In office number provided [phone] call before entering building  Routed to [facility] for insurance verification and Dr. Dia Sitter    By: Chrisandra Carota RN ~ December 16, 2019 1:36 PM    Details: Dr. Dia Sitter is not contracted with [employer] yet  By: Elpidio Eric ~ December 16, 2019 1:40 PM    Follow-up #3  Details: well that sucks  may be able to switch her to the [facility] appt at :1:20  am waiting until after 3 today so I can take the SDA appt      By: Margit Banda [place] ~ December 16, 2019 1:56 PM    Details: Dr. Vear Clock has an available in office appt  resched her to 12:40 tomorrow  pt notified  AB  By: Margit Banda [place] ~ December 16, 2019  3:15 PM    Follow-up #4  Details: noted  By: Chrisandra Carota RN ~ December 16, 2019 3:21 PM    By: [name], MD ~ December 17, 2019 11:15 AM

## 2019-12-17 ENCOUNTER — Ambulatory Visit

## 2019-12-17 NOTE — Telephone Encounter (Signed)
 Phone Note -       Initial call taken by: Margit Banda [place],  December 17, 2019 11:34 AM  Initial Details of Call:  We are asking you to attest for your own safety and the safety of our staff.    Fever  SOB  Headaches  New loss of smell/taste  Fatigue  New cough(not related to chronic condition)  New nasal congestion or runny nose (not related to seasonal allergies)  Muscle aches  Aches and Pains  Sore throat  Diarrhea  Have you been tested for COVID?  If so, Where (facility) ?  Date?  Covid Result:    Positive / Negative  Has anyone you?ve been in contact with or in your household tested positive for covid? If YES When?     neg to all

## 2019-12-17 NOTE — Progress Notes (Signed)
 Receipt of: dizziness SDA    The following were sent to 'Kelsey Pace' at [email or web address] on 12/20/2019 5:40:43 AM:     - Secure message created from [facility] template     - Attachment created from [facility] template

## 2019-12-17 NOTE — Telephone Encounter (Signed)
 Phone Note -       Initial call taken by: Margit Banda [place],  December 17, 2019 9:55 AM  Initial Details of Call:  left mssg appt today moved to 2pm

## 2019-12-17 NOTE — Telephone Encounter (Signed)
 Phone Note -       Initial call taken by: Elpidio Eric,  December 17, 2019 3:22 PM    Risk of Patient: Average Risk  Status:  Due    Last Colonoscopy: normal -rpt 10 yrs (12/31/2011 3:00:38 PM)

## 2019-12-17 NOTE — Progress Notes (Signed)
 Primary Provider:  [name], MD      History of Present Illness:  Kelsey Pace is a 53 year old Female who presents today for: dizziness SDA  random during the day but always in the am when sitting up and getiing out of med  eye fatigue and some vision changes  neck and back of head pain  no nausea or vomiting  no recent injurys      Is there an updated release of information to speak for the patient on file ?   Specialists seen since last visit?  Has previsit planning and a huddle been performed on this patient?     Dizziness  --Woke up 5 days ago with dizziness  --When she got up from bed felt the room was spinning, had to hold on to something to stand up and couldn't walk  --felt weak the whole day  --Almost fell down that night  --Also had headache that felt like a pressure and eyes felt swollen  --Slept for a while and felt better  --Had these sx all day; had some tea which didn't seem to help  --The following day woke up, had some tea and started to feel less dizzy but when she started to do some exercise again felt as though the room was spinning.  Continued to have HA on that day  --Continues to have pressure on the front of her head  --Also intermittently forgetting things  --No vomiting, nausea, vision changes other than when she has room spinning sensation  --HA hasn't improved at all, it will get less bothersome with advil and then recurs    Medications Prior to this Visit  MIRENA (52 MG) 20 MCG/24HR INTRAUTERINE INTRAUTERINE DEVICE (LEVONORGESTREL) ; Route: INTRAUTERINE  TRIAMCINOLONE ACETONIDE 0.1 % EXTERNAL CREAM (TRIAMCINOLONE ACETONIDE) apply two times daily as needed to  rash; Route: EXTERNAL  PROAIR HFA 108 (90 BASE) MCG/ACT INHALATION AEROSOL SOLUTION (ALBUTEROL SULFATE) 2 puffs every 4 hours as needed for wheezing; Route: INHALATION    Past Medical History  hypertriglyceridemia  prediabetes  recurrent ovarian cysts  OSA    Surgical History  denies    Family History  mother - HTN,  pacemaker for arrhythmia  father - killed at age 4 by terrorists in [place]  Sister and brother with asthma, brother HTN  Mat GM - passed away from asthma attack  Denies DM, breast cancer, colon cancer, heart disease    Social History  born [place]  Lives with husband, daughter  Has 3 children - one lives in [place], one in [place], one in [place]  occ: not currently working, caring for her grandchildren, previously custodial  safe at home  non-smoker            5Vital Signs     Patient: 53 Years Old Female  Height:  62.5 in.  Weight: 214 lbs      Wt Chg: -1 since 06/05/2018  BMI:  38.66        38.84 on 06/05/2018  BP:  116/76 right arm, large cuff, seated     124/86 on 06/05/2018   Temp:  98.4  F    oral  Pulse:  84       regular    Patient is experiencing Pain  Location: head    Intensity: 4    Type: dull   Duration: constant    Medications and Allergies Reviewed    Signed: Margit Banda [place].Marland KitchenMarland KitchenMarland KitchenDecember 18, 2020 1:58 PM  PHQ 2    Over the last 2 weeks, how often have you been bothered by any of the following problems?  1. Little interest or pleasure in doing things:  0   - Not at all  2. Feeling down, depressed, or hopeless:  0   - Not at all        Gen: NAD  Neuro: CN 2-12 intact, strength/sensation equal and wnl, Romberg negative, no nystagmus, normal finger to nose  CV: RRR  Chest: CTAb      Impression and Recommendations:     ~VERTIGO, BENIGN PAROXYSMAL POSITION    Symptoms are very c/w vertigo.  Other possible dx include migraine or, unlikely, TIA or CVA.  Will give it a few more days to see if she feels better however asked her to call if not improving.  Trial meclizine.  Medication(s): MECLIZINE HCL 25 MG ORAL TABLET: take one tab by mouth every 8 hours as needed for dizziness.     ~HYPERTRIGLYCERIDEMIA     ~FASTING HYPERGLYCEMIA    Order(s): GLYCO-A1C**, COMMP -Comp. Metabolic Panel, LIPID -Lipid Panel**     ~PREVENTIVE HEALTH CARE    labs ordered, colo ordered  Order(s): COL -Colonoscopy,  Gastroenterology Referral          Changes to Medication List Documented Today:   MECLIZINE HCL 25 MG ORAL TABLET (MECLIZINE HCL) take one tab by mouth every 8 hours as needed for dizziness.; Route: ORAL      Medications:  MECLIZINE HCL 25 MG ORAL TABLET (MECLIZINE HCL) take one tab by mouth every 8 hours as needed for dizziness.  #30[Tablet] x 0   Route:ORAL   Entered and Authorized by: [name], MD   Signed by: [name], MD on 12/20/2019   Method used: Electronically to      [id number] (retail)     [address]     [place], [place]  [address]     Ph: [phone] or [phone]     Fax: [phone]   Note to Pharmacy: Route: ORAL;    RxID: [id number]  TRIAMCINOLONE ACETONIDE 0.1 % EXTERNAL CREAM (TRIAMCINOLONE ACETONIDE) apply two times daily as needed to  rash  #1[Tube] x 1   Route:EXTERNAL   Entered by: Margit Banda [place]   Authorized by: [name], MD   Signed by: [name], MD on 12/17/2019   Method used: Electronically to      [id number]* (retail)     [address]     [place], [place]  [id number]     Ph: [phone]     Fax: [phone]   Note to Pharmacy: Route: EXT;    RxID: [id number]

## 2019-12-20 ENCOUNTER — Ambulatory Visit

## 2019-12-20 LAB — HX COMPREHENSIVE METABOLIC PANEL
HX ALBUMIN: 3.8 g/dL (ref 3.2–5.0)
HX ALKALINE PHOSPHATASE: 98 U/L (ref 30.0–117.0)
HX ALT: 25 U/L (ref 6.0–55.0)
HX ANION GAP: 6 (ref 3.0–11.0)
HX AST: 20 U/L (ref 6.0–40.0)
HX BICARBONATE: 26 mmol/L (ref 21.0–32.0)
HX BUN: 18 mg/dL (ref 6.0–20.0)
HX CALCIUM: 9 mg/dL (ref 8.5–10.5)
HX CHLORIDE: 109 mmol/L (ref 98.0–110.0)
HX CREATININE: 0.81 mg/dL (ref 0.55–1.3)
HX GLOMERULAR FR AFRICAN AMERICAN: >90
HX GLOMERULAR FR NON AFRICAN AMER: 83
HX GLUCOSE: 88 mg/dL (ref 70.0–110.0)
HX POTASSIUM: 4.1 mmol/L (ref 3.6–5.2)
HX SODIUM: 141 mmol/L (ref 136.0–146.0)
HX TOTAL BILIRUBIN: 0.5 mg/dL (ref 0.2–1.2)
HX TOTAL PROTEIN: 6.7 g/dL (ref 6.0–8.4)

## 2019-12-20 LAB — HX LIPID PANEL FASTING
HX CHOLESTEROL (LIPR): 204 mg/dL — ABNORMAL HIGH (ref <=200)
HX HDL CHOLESTEROL: 41 mg/dL (ref >=40)
HX LDL CHOLESTEROL: 127 mg/dL (ref <=130)
HX TRIGLYCERIDES: 181 mg/dL — ABNORMAL HIGH (ref <=150)

## 2019-12-20 LAB — HX GLYCOHEMOGLOBIN
HX ESTIMATED AVERAGE GLUCOSE: 108 mg/dL
HX HEMOGLOBIN A1C: 5.4 % (ref <=5.6)

## 2019-12-20 NOTE — Telephone Encounter (Signed)
 Phone Note -       Initial call taken by: [name], MD,  December 20, 2019 5:35 AM  Initial Details of Call:  Spoke w pt, feeling a little bit better, taking advil frequently, also took something her daughter uses for migraines which helped a lot, asked her to call if she doesn't continue to improve  advised of normal A1c

## 2020-01-03 ENCOUNTER — Ambulatory Visit

## 2020-01-03 NOTE — Progress Notes (Signed)
 [facility]   [address]  [place], [place] [address]  Phone: [phone]  Fax: [phone]       January 03, 2020      Kelsey Pace  [address]    [place], [place] [address]        RE:  TEST RESULTS    Dear [name]:    I have carefully reviewed your most recent test results, and below is a summary of my findings.  Please take note of any instructions provided.    Electrolyte Studies:  Normal.  Anion Gap: 6    Goal:  3-11  Bicarbonate:  26    Goal:  21-32  Calcium:  9.0    Goal:  8.5-10.5  Chloride:  109    Goal:  98-110  Potassium:  4.1    Goal:  3.6-5.2  Sodium:  141    Goal:  136-146  Blood Glucose:  88    Goal:  70-110    Kidney Function Studies:  Normal.  BUN:  18    Goal:  6-20  Creatinine:  0.81    Goal:  0.550-1.300  eGFR:  83    Goal:      Liver Function Studies:  Normal.  Albumin:  3.8    Goal:  3.2-5.0  Alkaline Phosphate:  98    Goal:  30-117  Total Bilirubin:  0.5    Goal:  0.2-1.2  SGOT (AST):  20    Goal:  6-40  SGPT (ALT):  25    Goal:  6-55  Total Protein:  6.7    Goal:  6.0-8.4    Cholesterol Studies:  Good.  Comments: Triglycerides are a bit high but much better than last year.  No need for medication, please work on some diet changes (see enclosed information on triglycerides) to continue to improve your triglycerides.    Blood Glucose Studies:  Normal.  Blood Glucose:  88    Goal:  70-110  HGBA1C:  5.4    Goal:  <=5.6    Please call our office if you have any questions.    Sincerely,    [name], MD

## 2020-01-14 ENCOUNTER — Ambulatory Visit

## 2020-01-14 NOTE — Telephone Encounter (Signed)
 Phone Note -       Initial call taken by: Daneil Dan,  January 14, 2020 10:46 AM              Orders:  Added new Test order of COVID19 by PCR [facility]) - Signed

## 2020-01-17 ENCOUNTER — Ambulatory Visit

## 2020-01-17 NOTE — Progress Notes (Signed)
 [facility] [place]   [address]   [place], [place] [address]  Office: [phone] Fax: [phone]  January 17, 2020        Kelsey Pace  [address]  [place], [place]  [address]    Dear Ms. [name] Gottron is important information regarding your scheduled procedure.  DR. Claudie Leach has reviewed all of your medical information available and has reserved a time for your specified appointment.  Please note that this appointment requires a time slot at the endoscopy suite, booking of nursing staff, and (depending on the type of procedure) an anesthesiologist.  Cancelling your appointment on short notice, or not showing up for the appointment, leads to unnecessary downtime and cost and results in decreased access to timely care for our patients.  Try to avoid the cancellation of your appointment, if at all possible, by making the necessary arrangements needed in order to keep your appointment.  If you have to cancel ? Please call at least 5 business days ahead of time, to allow for another patient to be schedule in that time slot.  If you cancel on shorter notice, or do not show for your appointment, you may be billed a nominal administrative fee of $150.00 to help cover some of the incurred overhead costs.  We appreciate your cooperation and understanding in this matter.  Sincerely,  [facility]

## 2020-01-17 NOTE — Telephone Encounter (Signed)
----   Converted from flag ----  ---- 01/15/2020 7:39 AM, Romilda Garret wrote:  not booked    ---- 01/14/2020 10:47 AM, Daneil Dan wrote:  COVID-19 Drive Up Test Order  H47.654 - Exposure  Authorized by:  Renato Shin MD  Ordered by:  Daneil Dan  Comments: COLONOSCOPY BOOKED WITH DR. Claudie Leach ON 02/24/20    Patient: Kelsey Pace  DOB: May 28, 1966  ID: [id number]    Race:  Unspecified  Ethnicity:  Unspecified  Primary Language:  English  Reason for Test: Pre-op (<24 hrs)  Disability:    Occupation:    First Test?    Employed in Healthcare?    Is the Patient Symptomatic?  Unknown  Date of Onset:    Was the Patient hospitalized for this Illness?    Was the Patient in the ICU?    Is Patient a Resident of a Nursing Home, Group Home, [facility] or Shelter?    Pregnant?  ------------------------------

## 2020-01-17 NOTE — Progress Notes (Signed)
 [facility] [place]   [address]  [place], [place] [address]  Phone: [phone]  Fax: [phone]     January 17, 2020      Kelsey Pace  [address]  [place], [place] [address]      Dear Ms. Kelsey Pace:    Your appointment has been made for you. Please see details below.  Please bring a photo ID and your insurance card to your appointment.      Appointment Type: COLONOSCOPY  Facility/Provider  DR. [facility]  Facility/Provider Phone Number: [phone]  Date: 02/24/2020   Day of Week: Thursday  Time: 8:00 AM  Arrival Time: 7:00 AM      Appointment Details:   [facility]  [address]  [place], [place] [address]  4th Floor Endoscopy Unit     ***Our Insurance Coordination Department will be reaching out to you 3-5 days prior to your procedure to pre-register you for your appointment. During this phone call your demographic and insurance information will be confirmed and/or updated.  At this time they will also make you aware of any financial responsibilities you may have, such as co-payment, co-insurance or deductible.    Payments can be made over the phone at the time of preregistration.  Otherwise, payment is expected at check in on the day of your procedure.   If you have not heard from a member of the Insurance Coordination Department within 48 hours of your procedure please contact them at [phone].  You may also reach out to your insurance company with any questions regarding your financial responsibility.    ** IF YOU DO NOT HEAR FROM CENTRAL SCHEDULING A WEEK PRIOR TO YOUR APPOINTMENT, PLEASE CONTACT THEM DIRECTLY AT [phone] TO SCHEDULE YOUR COVID TESTING.  FAILURE TO HAVE THIS TEST DONE IN A TIMELY MANNER WILL RESULT IN YOUR APPOINTMENT BEING CANCELLED. **       Patient Instructions for Miralax Colonoscopy Preparation:    PURCHASE THESE OVER THE COUNTER LAXATIVES:  1. GATORADE (96 ounces): lemonade, orange, clear, blue, lemon-lime (purchase three 32 OZ  bottles)  2. DULCOLAX 5mg  tablets (two tablets)  3. MIRALAX BOTTLE 238 grams     IF YOU ARE TAKING BLOOD THINNERS (PLAVIX, XARELTO, OR COUMADIN), DIABETES MEDICATIONS (INSULIN OR TABLETS), HEART MEDICATIONS (DIGOXIN), OR IF YOU HAVE A PACEMAKER OR DEFIBRILLATOR (AICD):  PLEASE NOTIFY us WELL IN ADVANCE OF THE PROCEDURE TO RECEIVE SPECIFIC INSTRUCTIONS.    THE DAY BEFORE YOUR COLONOSCOPY: CLEAR LIQUIDS ONLY. ABSOLUTLEY NO SOLID FOOD    Drink only clear liquids.  Examples of clear liquids:  Water, clear fruit juices such as apple or white grape, chicken or beef bouillon, Jell-O (NO RED, GREEN, OR GRAPE), Gatorade, or similar sports drink (NO RED, GREEN OR PURPLE), popsicles (NO RED, GREEN, OR GRAPE), clear soft drinks (Sprite, ginger ale, 7UP etc.), coffee/tea  without cream.     NO MILK OR MILK PRODUCTS, NO ORANGE JUICE, NO RED, GREEN, OR GRAPE JELLO OR JUICES    3 PM:  Take 2 Dulcolax tablets   5PM-10PM:  Pour the  entire bottle of Miralax in a large pitcher filled with the three bottles of Gatorade. Mix well to dissolve all of the Miralax.  Pour the solution back into the three bottles. Begin drinking the first two bottles of the solution. You may drink it cold or room temperature, whichever you prefer. If you need a break, that's okay. You can stop for 30 minutes and resume.  MIDNIGHT:  ***Very Important-Please read carefully***  After you have completed the two 32 OZ bottles of Gatorade, you must finish the remaining bottle by 3AM. Split dosing preparations are especially important because they help ensure the best clean out over conventional preps. Consuming the second dose later allows for clean out of residual stool because your body continues to produce stool.**    THE DAY OF YOUR COLONOSCOPY:  FOR AM PROCEDURES:  You may take your morning medications with a sip of water.     FOR AFTERNOON PROCEDURES:  You may drink clear liquids until 6AM.  NO SOLID FOOD.  You may take your morning medications with a sip of  water.     **If you typically have caffeine, and are prone to caffeine headaches, you may have a  small cup of coffee or tea  WITHOUT milk or cream in the morning.   You should have this at least four hours before your arrival time.  **    REMEMBER:  The preparation is very important.  An adequate clean out allows for the best evaluation of your entire colon.  During the prep, using baby wipes may ease some of your discomfort (if any). Applying Vaseline or similar to the anal area can also help with any discomfort.    *If you become ill with cold or flu symptoms, you should call the office immediately and speak to the nurse.*    All patients must make arrangements to be picked up and accompanied home by a friend or relative at time of discharge.  You will not be allowed to go home by yourself, including public transportation, or taxi.  You should NOT plan on working or driving the rest of the day due to the sedation given at the procedure.     You will be able to return home approximately 3 hours from your arrival time.         Sincerely,    Renato Shin, M.D.

## 2020-01-22 ENCOUNTER — Ambulatory Visit

## 2020-02-10 ENCOUNTER — Ambulatory Visit

## 2020-02-10 NOTE — Telephone Encounter (Signed)
 Phone Note -       Initial call taken by: Eugenia Mcalpine,  February 10, 2020 10:27 AM  Initial Details of Call:  LVM for pt to confirm appt date, time/arrival, transportation, prep instructions  paatient to call  CS if no covid test has been schedule, no test dated listed

## 2020-02-16 ENCOUNTER — Ambulatory Visit

## 2020-02-16 NOTE — Telephone Encounter (Signed)
 Phone Note -       Initial call taken by: Renae Fickle,  February 16, 2020 11:26 AM  Initial Details of Call:  pt returned call and confirmed appt date, time/arrival, transportation. did not have prep instructions, emailed prep and covid test was not schedueled. transferred to CS

## 2020-02-21 ENCOUNTER — Ambulatory Visit

## 2020-02-22 LAB — HX COVID19 BY PCR CIRCLE HEALTH: HX COVID19 RESULT: NOT DETECTED

## 2020-02-24 ENCOUNTER — Ambulatory Visit

## 2020-02-28 ENCOUNTER — Ambulatory Visit

## 2020-02-28 ENCOUNTER — Ambulatory Visit: Admitting: Gastroenterology

## 2020-07-20 ENCOUNTER — Ambulatory Visit

## 2020-07-25 ENCOUNTER — Ambulatory Visit

## 2020-07-27 ENCOUNTER — Ambulatory Visit: Admitting: Family Medicine

## 2020-07-27 NOTE — Telephone Encounter (Signed)
 Internal Correspondence   Initial call taken by: Gertie Exon MD,  July 27, 2020 7:51 PM  Summary of Call: please let patient know mammogram is normal- will follow up in one year      Follow-up #1  Details: Spoke to pt and relayed msg.    Closing note.  No further action necessary.    By: Marchelle Folks ~ July 31, 2020 9:43 AM    Details: Note reviewed.   no further actions, closing note.  By: Cyndy Freeze [place] ~ July 31, 2020 10:56 AM

## 2020-10-13 ENCOUNTER — Ambulatory Visit

## 2020-10-13 NOTE — Telephone Encounter (Signed)
 Phone Note -     Patient    ***URGENT***    Call back at Cell [phone]  Initial call taken by: Ileene Patrick (Patient Access Center),  October 13, 2020 8:46 AM  Actual Caller: Patient  Call For: Nurse  Initial Details of Call:  Patient has chosen Dr. Dia Sitter as her new PCP.   Patient calling stating that she is not feeling good and has a cough. Patient stated that she tested positive for covid on 10-11-2020. Patient stated that over the counter medications are not working and is asking for medication to help with the cough.  Patient stated that she does not have a fever.    Send Prescription to: [id number] [[facility]]   [address], [place] [id number]    BCB # [phone]        Follow-up #1  Details: Pt tested Negative w/ Covid  home test on 10/09/20  Pt tested positive for Covid at [facility] on 10/11/20 and was told they were positive last night.  Pt is fully covid vaccinated and all the people in house are vaccinated except 66yrs old granddtr  Pt wears a mask when in contact w. grandchild, cooks/feed, bathing grand child.  Pt has not quarantined in the home since  Pt stayed with people in [place] last weekend and returned on Monday felt sx and took the home test that was Negative but still had cold sx    Has pt travelled recently or had direct exposure?(date of exposure) YES TRAVELED TO [place] ON THE WEEKEND OF 10/9  When did symptoms start? 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  NO    Pregnant? NO       reviewed INS w. pt and ID # is correct bu INS is invalid  pt does not know why it INS is not valid?  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 Dia Sitter  pt cannot be booked for a Zoom due to invalid INS      Routing to Dr. Dia Sitter & Daxy    By: Reggie Pile [place] ~ October 13, 2020 10:03 AM    Details: As pt has not est care and last seen with Dr. Vear Clock 11/2019- needs to have visit for medications. Treatment of cough depends on multiple factors- is it due to postnasal drip, underlying alt infection, reactive airway, etc. If pt cannot be seen via zoom, would rec UC visit. Thanks.  By: Sherlynn Carbon MD ~ October 13, 2020 10:33 AM    Follow-up #2  Details: scheduled apt fir 10/17/2020 via zoom     link sent   By: Leone Brand [place] ~ October 13, 2020 11:46 AM

## 2020-10-13 NOTE — Telephone Encounter (Signed)
 Phone Note -       Call back at [facility] [phone]  Call back at Cell [phone]  Initial call taken by: Reggie Pile [place],  October 13, 2020 10:04 AM  Initial Details of Call:  Pt called today re: another matter and their INS is invalid  When I reviewed ID # w/ pt it is correct  pt said it it [employer] EPO but it still shows up as invalid?  Am I missing something?  Please review  Thank You    Routing to [place] for review  ..................................................................Marland KitchenReggie Pile [place]  October 13, 2020 10:10 AM       Follow-up #1  Details: I verified pt's insurance on [employer] and pt's insurance is showing as INACTIVE.  Called pt LVM to check with her insurance regarding the eligibiluty and memeber ID and call back with an update.   By: Renold Don ~ October 16, 2020 11:01 AM    Details: Pt called back and aware that insurance is inactive. She  will check with her husband who provides the insurance and then call us back  By: Chrisandra Carota RN ~ October 16, 2020 11:13 AM    Follow-up #2  Details: called pt to see if she updated insurance, she stated they on there end it is active (checked clearwave still not active) she does not have a reference number asked her to call them to ask for a reference number so she can be seen in office or via telehealth or she can pay up front the $80.00  By: Leone Brand [place] ~ October 17, 2020 8:38 AM    Details: Daxy transferred pt to me over the phone to assist with taking payment.  Notified pt that the payment would be $85.00 rather than $80.00.   Pt verbalized understanding and paid with a Mastercard over the phone.   Payment went through successfully.   Transferred pt back to Daxy as requested.     ~No further action needed.   Closing note.   By: Gardiner Coins ~ October 17, 2020 9:21 AM

## 2020-10-16 ENCOUNTER — Ambulatory Visit: Admitting: Family Medicine

## 2020-10-16 NOTE — Progress Notes (Signed)
 schedule CPE      Pt tested +covid19 on 10/13, having cough and wanting rx as OTC is not working- has tried dayquil and nyquil theraflu.  Pt has not quarantiend at home, but report wearing mask around grandkids.   Pt started having sx 10/11 when returned after weekend in [place].   Pt having cough with yellow sputum, nasal congestion. Denes shob/wheeze, fever, HA, myalgia, sore throat, n/v/d.     ?postnasal drip, underlying alt infection, reactive airway, etc

## 2020-10-17 ENCOUNTER — Ambulatory Visit

## 2020-10-17 NOTE — Progress Notes (Signed)
 Receipt of: covid, cough - Video    The following were sent to 'Nadean Corwin' at [email or web address] on 10/17/2020 9:39:54 AM:     - Secure message created from ACM template     - Attachment created from ACM template

## 2020-10-17 NOTE — Progress Notes (Signed)
 Primary Provider:  [name], MD      History of Present Illness:  Kelsey Pace is a 54 year old Female who presents today for: postive for covid    Is there an updated release of information to speak for the patient on file ? no  Specialists seen since last visit? no  Has previsit planning and a huddle been performed on this patient? yes       Pt tested +covid19 on 10/13, having cough and wanting rx as OTC is not working- has tried dayquil and nyquil theraflu. Pt has not quarantiend at home, but report wearing mask around grandkids. Pt started having sx 10/11 when returned after weekend in [place]. Pt having cough with yellow sputum, nasal congestion. Denies shob/wheeze, chest pain, fever, HA, myalgia, sore throat, n/v/d. Reports improving in symptoms. Pt has tried mucinex, which helps.           Current Problems- Reviewed during today's visit  COVID-19 CORONAVIRUS INFECTION (ICD10-U07.1)  VERTIGO, BENIGN PAROXYSMAL POSITION (ICD10-H81.10)  HYPERTRIGLYCERIDEMIA (ICD10-E78.1)  FASTING HYPERGLYCEMIA (ICD10-R73.01)  ABNORMAL BREAST IMAGING CYST REPEAT SONO 01/2019 (ICD10-R92.8)  HEADACHE (ICD10-R51)  URINARY INCONTINENCE (KVQ25-Z56)  PREVENTIVE HEALTH CARE (ICD10-Z00.00)  SLEEP APNEA, OBSTRUCTIVE, SEVERE (ICD10-G47.33)  OBESITY, BMI 35-39.9, ADULT (ICD10-E66.9)  ? ADENOMYOSIS (ICD10-N80.0)    Current Medications- Reviewed during today's visit  MIRENA (52 MG) 20 MCG/24HR INTRAUTERINE INTRAUTERINE DEVICE (LEVONORGESTREL):   TRIAMCINOLONE ACETONIDE 0.1 % EXTERNAL CREAM: apply two times daily as needed to  rash  PROAIR HFA 108 (90 BASE) MCG/ACT INHALATION AEROSOL SOLUTION (ALBUTEROL SULFATE): 2 puffs every 4 hours as needed for wheezing  MECLIZINE HCL 25 MG ORAL TABLET: take one tab by mouth every 8 hours as needed for dizziness.  BENZONATATE 100 MG ORAL CAPSULE: 1 pill up to 3 times a day for cough  ALLERGY RELIEF 50 MCG/ACT NASAL SUSPENSION (FLUTICASONE PROPIONATE): 1-2 spray each nostirl daily; substitute  okay    Current Allergies- Reviewed during today's visit  * NO KNOWN DRUG  ALLERGIES (Mild)    Past Medical History  hypertriglyceridemia  prediabetes  recurrent ovarian cysts  OSA    Surgical History  denies    Family History  mother - HTN, pacemaker for arrhythmia  father - killed at age 75 by terrorists in [place]  Sister and brother with asthma, brother HTN  Mat GM - passed away from asthma attack  Denies DM, breast cancer, colon cancer, heart disease    Social History  born [place]  Lives with husband, daughter  Has 3 children - one lives in [place], one in [place], one in [place]  occ: not currently working, caring for her grandchildren, previously custodial  safe at home  non-smoker    Risk Factors  Smoking Status: never smoked  Passive smoke exposure: No    Drug use: no  Alcohol use: no    Exercise: Yes  Times per week: 2  Exercise Comments:  active with grandchildren, used to be more active when she was working    Caffeine (drinks/day): 0-1  Sun exposure: occasionally, wears sunscreen  Seatbelt use (%): 100  Family History MI in female age < 71: no  Family History MI in female age < 58: no        Review of Systems   General: Denies fever, chills, sweats, anorexia, fatigue, weakness, malaise, weight loss.   Eyes: Denies visual change or blurring, eye pain.   Ears/Nose/Throat: Denies earache, decreased hearing, difficulty swallowing.   Cardiovascular: Denies  chest pain or pressure, palpitations, shortness of breath.   Respiratory: Complains of productive cough. Denies dry cough, shortness of breath, wheezing.   Gastrointestinal: Denies acid indigestion, nausea, vomiting, diarrhea, abdominal pain, change in bowel habits, constipation, mucous or blood in stools.   Musculoskeletal: Denies muscle cramps or aches, muscle weakness, morning stiffness, joint pain, joint swelling.   Skin: Denies dry skin, rash, skin ulcers, suspicious lesions.   Psychiatric: Denies anxiety, depression, insomnia.     Vital Signs      Patient: 54 Years Old Female  Height:  62.5 in.  Weight: 214 lbs        BMI:  38.66        38.66 on 12/17/2019  Old BP:  116/76 on 12/17/2019  Temp:  98.29  F      Pulse:  83         Pulse Ox: 96 %    Patient is not experiencing pain  Are you having trouble paying for your medications?  No    Medications and Allergies Reviewed    Signed: Leone Brand [place].Marland KitchenMarland KitchenMarland KitchenOctober 19, 2021 9:16 AM    PHQ 2    Over the last 2 weeks, how often have you been bothered by any of the following problems?  1. Little interest or pleasure in doing things:  0   - Not at all  2. Feeling down, depressed, or hopeless:  0   - Not at all          Physical Exam    General:      over video: no acute distress  Head:      normocephalic and atraumatic.    Eyes:      EOMI  Ears:      grossly normal hearing.    Lungs:      normal respiratory effort, frequent cough  Psych:      alert and cooperative; normal mood and affect; normal attention span and concentration.          Impression and Recommendations:    1.  COVID-19 CORONAVIRUS INFECTION (U07.1)    Dicussed quarantine 14d since start of symptoms, pt improving but cont cough/congestion  Rx sent for flonase and benzonatate; discussed cont conservative home care- hot drinks, honey, etc  Pt having intermittent rt eye redness- self resolves, discussed likley post-tussive     - advised to contact if does not improve or other sx arise  ED/return precautions discussed      Changes to Medication List Documented Today:   BENZONATATE 100 MG ORAL CAPSULE (BENZONATATE) 1 pill up to 3 times a day for cough; Route: ORAL  ALLERGY RELIEF 50 MCG/ACT NASAL SUSPENSION (FLUTICASONE PROPIONATE) 1-2 spray each nostirl daily; substitute okay; Route: NASAL  Orders:   Added new Service order of Patient Encounter ([id number]) - Signed  Added new Service order of OV Est Level III ([id number]) - Signed      Medications:  ALLERGY RELIEF 50 MCG/ACT NASAL SUSPENSION (FLUTICASONE PROPIONATE) 1-2 spray each nostirl daily;  substitute okay  #1[Container] x 0   Route:NASAL   Entered and Authorized by: Sherlynn Carbon MD   Signed by: Sherlynn Carbon MD on 10/17/2020   Method used: Electronically to      [id number]  [address]  [place], [place]  [address]  Ph: [phone] or [phone]  Fax: [phone]   Note to Pharmacy: Route: NASAL;    RxID: [id number]  BENZONATATE 100 MG ORAL CAPSULE (BENZONATATE) 1 pill up to 3  times a day for cough  #30[Capsule] x 0   Route:ORAL   Entered and Authorized by: Sherlynn Carbon MD   Signed by: Sherlynn Carbon MD on 10/17/2020   Method used: Electronically to      [facility]  [address]  [place], [place]  [address]  Ph: [phone] or [phone]  Fax: [phone]   Note to Pharmacy: Route: ORAL;    RxID: [id number]          This real-time, interactive virtual Telehealth encounter was done by video  Two patient identifiers were used and confirmed.  The patient was at home.  My location: office  Other participants/Involvement: no  The patient has consented to this encounter type.  Total minutes spent: 20    This treatment is voluntary and a claim will be submitted to your insurance company for payment. You agree there are certain limitations in providing your care in this manner in lieu of an in person visit. Some virtual applications are not considered HIPAA compliant and therefore not secure. If a platform is not HIPAA compliant you agree to use of this application for this visit.    Patient presents during the COVID-19 pandemic / federally declared state of public health emergency. This service conducted via video. Normal HIPAA rules have been waived and patient accepts and understands these conditions.

## 2020-12-27 ENCOUNTER — Ambulatory Visit

## 2020-12-27 NOTE — Telephone Encounter (Signed)
 Phone Note -     Patient    Routine    Initial call taken by: Ancil Linsey (Patient Access Center),  December 27, 2020 10:40 AM  Initial Details of Call:  Patient calling in with a sore throat. She was around other family members that have a confirmed case. She has to attend a funeral today so she isn't sure if she should go. Please follow up with her at [phone]      Follow-up #1  Details: Called pt.   Her son and Husband have sx of COVID.   family was exposed to a positive COVID person.   Explained we are testing but are booked until Wednesday next week.   Pt understood and wil try for test with husband who is being tested today.   pt will call if needing test.   Pt was advsied it is safer to NOT attend the Funeral for saftey of everryone.  pt agreed.    By: Cyndy Freeze [place] ~ December 27, 2020 2:41 PM

## 2021-03-08 ENCOUNTER — Encounter (HOSPITAL_BASED_OUTPATIENT_CLINIC_OR_DEPARTMENT_OTHER): Payer: Self-pay

## 2021-03-28 NOTE — Teleconsult (Signed)
Phone Note -       Initial call taken by: [name], MD,  June 11, 2018 8:59 AM  Initial Details of Call:  Please let her know  1) Blood sugar very slightly elevated.  Does not have diabetes but is at risk of developing diabetes in the next few years.  Losing weight will help prevent her from developing diabetes, as well as diet changes.  2) Cholesterol is good except for one measure called triglycerides, which is high.  This is treated with diet change.  We can send information on foods to avoid to improve triglycerides, or if she puts into google "[facility] triglycerides" she will find good information      Follow-up #1  Details: Spoke with patient and results relayed.  No further questions at this time.    Triglycerides infomartion mailed to patient per her request.  By: Claretta Fraise [place] ~ June 11, 2018 9:08 AM

## 2021-03-28 NOTE — Teleconsult (Signed)
Phone Note -       Initial call taken by: Princella Pellegrini. Elisabeth Most MD,  February 01, 2017 12:18 PM  Initial Details of Call:  Please call patient regarding recent ER visit.  "We noticed that you were recently in the emergency room. We wanted to see how you were feeling and offer you an appointment if you are not feeling better or if you were advised to do so at the ER."        Follow-up #1  Details: scheduled pt today at 2:45 with Dr Elisabeth Most   pt is aware of appt   By: Lawernce Ion [place] ~ February 03, 2017 10:44 AM

## 2021-03-28 NOTE — Teleconsult (Signed)
Phone Note -     Patient    ***URGENT***    Call back at [phone]  Initial call taken by: Lenell Antu (Patient Access Center),  June 04, 2018 10:19 AM  Initial Details of Call:  Patient calling in stating she has been getting the worst headaches going on for about a week now and it starts at 2 am and 3 am also, has been having dizziness.       Follow-up #1  Details: Call received from [facility]. Pt. states calling to schedule a CPE and having bad headaches with dizziness x1 week. Pt. states the headache occur at 2-3 am each day. Pain is 10/10. Pt. also states it is accompanied by dizziness and extreme fatigue.  Pt. denies fever and/or chills. Denies nausea and/or vomitting. Pt. states she takes Ibuprofen and uses an ice pack and it subsides greatly, but comes back the next day. Pt. offered an appt. to be seen today, 06/04/18, but pt. declined since Dr. Vear Clock is not in the office today and does not want to see any other provider. Pt. requested to see Dr. Vear Clock tomorrow, 06/05/18. Dr. Vear Clock avail for a CPE 06/05/18, pt. scheduled for that appt. at 3:45 pm. Pt. advised if h/a  worsens, please call office and  an appt. can be scheduled for today. Routing to [place], Cassie and Dr. Vear Clock as Lorain Childes.     By: Mathis Bud RN ~ June 04, 2018 10:47 AM    Details: noted routing to Dr. Vear Clock  By: Manfred Shirts [place] ~ June 04, 2018 11:13 AM    Follow-up #2  Details: noted  By: [name], MD ~ June 04, 2018 1:38 PM

## 2021-03-28 NOTE — Progress Notes (Signed)
[facility]  [address]  &  [address]  [place], [place] [address]                    [place], [place] [address]  Tel: [phone]                     Fax: [phone]           November 27, 2016      Regarding Patient: Kelsey Pace  DOB: [birth year]    [name] was last seen in our office on: November 27, 2016.  Able to Return on: 11/29/2016    With any questions or concerns, please feel free to contact the office.    Sincerely,     Kennis Carina, M.D.

## 2021-03-28 NOTE — Teleconsult (Signed)
Phone Note -       ***URGENT***    Initial call taken by: Derry Skill,  June 03, 2016 12:03 PM  Initial Details of Call:  Pt, Kelsey Pace, states that she has had an inflammation in her eye all weekend and nothing that she has tried has helped. Pt would like to come in today for an appointment. I attempted to transfer to office but was on hold 4+ minutes. Please call Tawney back to schedule an appointment. Thank you.       Follow-up #1  Details: How long have you had the symptoms: WKEND  Which eye: LEFT  redness/ swelling: NOT RED, SWOLLEN  Any pain or just uncomfortable: FRI USED IBUPROFEN, WASHED EYE WITH WARM WATER  pain scale 1-10: 7  weepy/discharge/crusty: NO  Itchy: INSIDE EYE AT 3A, LESS ITCHY NOW  Fevers(temp): UNSURE  Foreign body- Chemical-what kind? Sand? NO Punched/fight: NO  Blurred vision: LITTLE BIT  Do you have eye doctor: UNSURE, INSURANCE CHANGE    Appt booked for 2:30. States no longer has BC, now has [employer] PPO, unable to give ID #, agrees to call [place] & return call  By: Tomie China ~ June 03, 2016 12:40 PM

## 2021-03-28 NOTE — Teleconsult (Signed)
Phone Note -       Initial call taken by: Kalman Drape RN,  July 11, 2016 11:51 AM  Initial Details of Call:  The patient's daughter, Victorino Dike (patient gave verbal permission to speak to her), . The patient was seen on 07/04/16 for a cough and given some medication.  Victorino Dike is wanting to discuss her mother's continuing  coughing issues as she is still coughing almost continuously and having a hard time speaking.  The ocugh medicine had increased her blood pressure when she was taking it.  call her back  [phone] . Appt scheduled with Dr. Loletha Grayer for today @ 2:45 PM.          When did it start: X ! MONTH  dry or productive(give color): DRY    Wheezing/ SOB: SOMETIMES    Fevers(with temp)/chills: DENIES    chest congestion: DENIES  head pain: YES    nose congested: DENIES  sore throat: YES    ear pain: DENIES    ear blocked: DENIES    inhalers: PROAR  nausea/vomiting/diarrhea DENIES  Hx of asthma/allergies/COPD: DENIES  On any ace inhibitors (lisinopril):  DENIES  improve with OTC meds/remedy:     overall:   better, worse, no change NO CHANGE        Follow-up #1  Details: can she got o [facility] or [facility] beofre I see here for CXR?  CXR ordered  By: Cheryln Manly MD ~ July 11, 2016 12:42 PM    Details: When daughter Victorino Dike and I spoke this AM she said that her Mom was going to go to the ED. I told her we she did not have to go threre, wecould see her here. Evidently,Mom went anyway and is @ [facility]. They kept Appt with you thinking thaty she could follow-Up here. I told her I would cancel appt here since Mom is already being seen. Informed her she can call for Follow-Up as needed Post ED.  By: Kalman Drape RN ~ July 11, 2016 1:22 PM    Follow-up #2  Details: pt being seen in ED  By: Cheryln Manly MD ~ July 11, 2016 1:32 PM        Orders:  Added new Test order of XR - Chest (XR-CHST) - Signed

## 2021-03-28 NOTE — Progress Notes (Signed)
[facility]  [address], [address]  &  [address]  [place], [place] [address]                    [place], [place] [address]  Tel: [phone]                     Fax: [phone]         November 29, 2016      Statia Burdick  [address]    [place],  [address]        RE:  TEST RESULTS    Dear [name]:    I have carefully reviewed your most recent test results, and below is a summary of my findings.  Please take note of any instructions provided.    Electrolyte Studies:  Normal.  Anion Gap:  11 mmol/L    Goal:  (5-15 mmol/L)  Bicarbonate:  27    Goal:  (22 - 29 mmol/L)  Calcium:  9.0    Goal:  (8.5 - 10.5 mg/dL)  Chloride:  315    Goal:  (98 - 110 mmol)  Potassium:  4.4    Goal:  (3.4 - 5.0 mmol/L)  Sodium:  143    Goal:  (135 - 145 mmol/L)  Blood Glucose:  74    Goal:  (70 - 100 mg/dL Fasting)    Kidney Function Studies:  Normal.  BUN:  12    Goal:  (6 - 20 mg/dL)  Creatinine:  0.7   Goal:  (0.4 - 1.2 mg/dL)  eGFR:  89    Goal:  ( > 90 mL/min/1.73 m2)  Note: If patient African-American, multiply result by 1.212.    Liver Function Studies:  Normal.  Albumin:  4.1    Goal:  (3.5 - 4.9 g/dL)  Alkaline Phosphate:  74    Goal:  (30 - 117 U/L)  Total Bilirubin:  0.4    Goal:  (0.2 - 1.2 mg/dL)  SGOT (AST):  18    Goal:  (0 - 31 U/L)  SGPT (ALT):  17    Goal:  (0 - 31 U/L)  Total Protein:  6.5    Goal:  (6.5 - 8.4 g/dL)    Cholesterol Studies:  Fair.  Comments: Your HDL or "good" cholesterol was a little low. This can be improved by working on physical activity and eating "healthy" fats like nuts, fish and oils.   Cholesterol:  165    Goal:  ( < 200 mg/dL)  HDL:  35    Goal:  ( > 50 mg/dL)  LDL:  88    Goal:  ( < 130 mg/dL)  Non-HDL:  176    Goal:  ( < 130 mg/dL)  [id number] Goal:(< 150 mg/dL)    Blood Count:  Normal.  White Blood Cell Count:  6.8    Goal:  (4.5 - 11.0 K/uL)  Red Blood Cell Count:  4.2 M/UL    Goal:  (3.8 - 5.2 M/uL)  Platelet Count:  234 K/UL     Goal:  (130 - 400 K/uL)  Hemoglobin:  13.7    Goal:  (11.7 - 15.7 g/dL)  Hematocrit:  46.2    Goal:  (34.9 - 46.9%)  Mean Corpuscular Volume:  96.2    Goal:  (80.0 - 100.0 fL)  Mean Corpuscular Hemoglobin:  32.5    Goal:  (26.4 - 34.0  pg)  Mean Corpuscular Hemoglobin Concentration:  33.8    Goal:  (31.4 - 35.8 g/dL)  Red Cell Distribution Width:  45.0    Goal:  (35.0 - 51.0%)  Mean Platelet Volume:  10.5    Goal:  (9.0 - 11.8 fl)  Basophils %:  0.6    Goal:  (0 - 2%)  Eosinophils %:  5.8    Goal:  (0 - 5%)  Neutrophils %:  65.1 (?)    Goal:  (40 - 70%)  Monocytes %:  7.6    Goal:  (0 - 12%)    Thyroid Studies:  Normal.  TSH W/Reflex:  2.34    Goal:  (0.27 - 4.20 uIU/mL)      I wanted to let you know the results of your blood work. Please feel free to call with any questions or concerns!       Sincerely,     Kennis Carina, M.D.

## 2021-03-28 NOTE — Progress Notes (Signed)
[facility]  [address]  &  [address]  [place], [place] [address]                    [place], [place] [address]  Tel: [phone]                     Fax: [phone]           February 03, 2017      Regarding Patient: Kelsey Pace  DOB: 1966-11-14    Kelsey Pace was last seen in our office on: February 03, 2017.  Kelsey Pace is currently under my care, and is not at this time medically able to attend work due to medical reasons.  Starting on: 02/03/2017  Able to Return on: 02/06/2017      Sincerely,      Vicki Mallet, MD  [facility]

## 2021-03-28 NOTE — Teleconsult (Signed)
Phone Note -     Patient    Routine    Call back at [phone]  Initial call taken by: Jonita Albee ([facility]),  August 04, 2018 1:40 PM  Initial Details of Call:  Patient would like a call in regards to her results for her US done yesterday in [place]  Contact [phone]      Follow-up #1  Details: [place] results signed  routing to PCP to advise  By: Arlice Colt ~ August 04, 2018 1:57 PM    Details: spoke w pt, relayed results of [place] which show a complex cyst, likely benign, rpt 6 mos  By: [name], MD ~ August 04, 2018 2:21 PM          Problems:  Added new problem of ABNORMAL BREAST IMAGING CYST REPEAT SONO 01/2019 (ICD-793.89) (ICD10-R92.8)

## 2021-03-28 NOTE — Teleconsult (Signed)
Phone Note -       Initial call taken by: Meredith Leeds,  July 04, 2016 2:15 PM  Initial Details of Call:  pt calling stating that she has had a cough for more then a week now. appt scheduled 7/6 at 5:30 with Dr. Loletha Grayer     When did it start: LAST WEEK   dry or productive(give color): PRODUCTIVE, GREEN MUCUS   Wheezing/ SOB: SOB WHEN ACTIVE   Fevers(with temp)/chills: NO  chest congestion: YES   head pain: YES   nose congested: YES   sore throat: NO   ear pain: A LITTLE BIT   body aches: NO   inhalers: NO   nausea/vomiting/diarrhea: NO

## 2021-03-28 NOTE — Teleconsult (Signed)
Phone Note -       Initial call taken by: Brantley Fling,  June 24, 2018 3:11 PM  Initial Details of Call:  [facility]   Spoke to Gloris [place]     It was booked with and without contrast for HA.  MD there is suggesting without contrast.   They would like a new order.    Appointment @ 4PM     P: [phone]   F: [phone]       Follow-up #1  Details: new order entered will have [facility] fax  By: [name], MD ~ June 24, 2018 3:50 PM    Details: faxed and spoke to [facility] who did not recieve yet, refaxed  By: Manfred Shirts [place] ~ June 24, 2018 4:08 PM    Follow-up #2  Details: spoke to christie who confirmed patient was scanned and order was recieved  By: Manfred Shirts [place] ~ June 25, 2018 9:05 AM          Orders:  Added new Test order of MRI- Brain Without Contrast Horatio Pel) - Signed

## 2021-03-28 NOTE — Teleconsult (Signed)
Phone Note -       Initial call taken by: [name], MD,  June 25, 2018 11:24 AM  Initial Details of Call:  Please let her know the MRI of her brain was normal so nothing concerning that could explain her headaches.  If they are continuing to bother her I would recommend she see a neurologist, let me know if she wants me to refer      Follow-up #1  Details: Spoke with pt and she continues having headaches.    Already saw neurologist at [facility] on June 17th, her name is Martinique, pt does not remember her last name.    Pt stated that she will try to reach her Neurologist to schedule a f/u appt.    Routing to Dr. Vear Clock as Lorain Childes.   By: Irean Hong [place] ~ June 25, 2018 12:14 PM

## 2021-03-28 NOTE — Teleconsult (Signed)
Phone Note -       Call back at Ph1   Initial call taken by: [name], MD,  November 27, 2017 9:14 PM  Initial Details of Call:  Pt with fevers/chills/severe joint pain.  In bed all day, temp was 103 this morning, taking high dose ibuprofen with no improvement in her fever.    2 weeks ago pt started to have joint pain, headaches, dizziness, cough.  Suddenly got worse 2 days ago, and started to have fevers (Tmax 103) that have not been responding to ibuprofen.  Reports a "pulsing pain" in suprapubic area, severe headache, and is feeling so ill she hasn't been out of bed all day.  Advised pt go to ED.    Nursing please call to check in on pt - can call Kelsey Pace at number in chart, or her daughter Kelsey Pace [phone]      Follow-up #1  Details: Pt seen @ [facility] 11/27/17 for Flu Symptoms.  Attempted to contact Pt.  On return call, Please ask Pt how she is doing.  Offer Follow-Up, though Pt has not been seen here since 02/03/17, Previous pt of Dr. Elisabeth Most. Who is Pt's PCP now? Also need to Update current Insurance.  Action: Left Message for Patient  By: Kalman Drape RN ~ November 28, 2017 8:15 AM    Details: She is changing to Dr. Vear Clock. Aware she needs to tell her insurance     [employer] EPO: [id number]  Updated insurance in EMR    Patient would like to call for a SDS next week as she needs to call her insurance first and find child care  Routing to new PCP as an FYI  By: Lazaro Arms, [place] ~ November 28, 2017 12:57 PM    Follow-up #2  Details: noted  Action: Phone call completed  By: [name], MD ~ November 28, 2017 1:58 PM

## 2021-03-28 NOTE — Teleconsult (Signed)
Phone Note -       Initial call taken by: Irean Hong [place],  January 30, 2017 12:47 PM  Initial Details of Call:  high fever did not take temp.  chills  sore throat  very dry cough this morning  no runny nose  body aches  headache yes   has not been around sick people not aware  chest feels like tight  sob  feels tired,fatigue  this morning was dizzy  no n/v  took yesterday and today Ibuprofen, advyl   aware of no appt available today   adviced urgent care today or call tomorrow sd sick visit.  Routing to RN, Noreene Larsson for further assistance         Follow-up #1  Details: Note reviewed. Routing to Dr.Sharece Fleischhacker.   By: Shaune Pascal RN ~ January 30, 2017 1:19 PM    Details: agree with appt  tylenol and ibuprofen  fluids  thx  By: Rudean Curt. Michel [place], MD ~ January 30, 2017 3:02 PM    Follow-up #2  Action: Phone call completed  By: Kalman Drape RN ~ January 30, 2017 3:05 PM

## 2021-05-17 ENCOUNTER — Telehealth (INDEPENDENT_AMBULATORY_CARE_PROVIDER_SITE_OTHER): Admitting: Family Medicine

## 2021-05-17 NOTE — Telephone Encounter (Signed)
Called pt, no answer. Left message on voice mail. 1st attempt

## 2021-05-17 NOTE — Telephone Encounter (Signed)
Pt is calling in because for the past 2 days shes had a really bad cough   Headache  Congestion in chest      Pt took two at home covid test and both where negative.  Pt wants to know what she can do if theres any medication that can be sent to her pharmacy     [address] [place] [address]  [facility]      Call back number : [phone]

## 2021-05-17 NOTE — Telephone Encounter (Signed)
Patient called about call back and is requesting another call back.    Callback: [phone]

## 2021-05-17 NOTE — Telephone Encounter (Signed)
Has pt travelled recently or had direct exposure? (date of exposure) traveled to a group function.   When did symptoms start?  3 weeks  Cough-Dry or productive:  Yes - cough  Wheezing/SOB:  SOB  Fevers/chills: denies  Head pain:  Yes headache.   Nose congested: slight  Sore throat: irritated  Ear pain or blocked: denies   Body aches:  yes  Use oxygen or inhalers for asthma/allergies/COPD::  For allergies - nasal spray  N/V/D:  denies  Improve w OTC meds:  Denies - pt tried Robitussin and Theraflu - no help at all.   Flu/COVID vaccine:  VAX'd and booster.  Occupation/Resident of nursing home, group home, foster home or shelter? NO  Pregnant? NO    Pt tried sons inhaler, some help with SOB. Albuterol inhaler.     Pt is asking what she can do for the cough? OTC or Rx?    Pt booked telehealth appt.   Portal info sent.

## 2021-05-18 ENCOUNTER — Ambulatory Visit
Admission: RE | Admit: 2021-05-18 | Discharge: 2021-05-18 | Disposition: A | Discharge status: Home / Self Care | Source: Ambulatory Visit | Attending: Family | Admitting: Family

## 2021-05-18 ENCOUNTER — Ambulatory Visit: Attending: Family | Admitting: Family

## 2021-05-18 ENCOUNTER — Other Ambulatory Visit (INDEPENDENT_AMBULATORY_CARE_PROVIDER_SITE_OTHER): Admitting: Family

## 2021-05-18 ENCOUNTER — Other Ambulatory Visit

## 2021-05-18 DIAGNOSIS — R059 Cough, unspecified: Secondary | ICD-10-CM

## 2021-05-18 MED ORDER — doxycycline (Vibramycin) 100 mg capsule
100 | ORAL_CAPSULE | Freq: Two times a day (BID) | ORAL | 0 refills | Status: AC
Start: 2021-05-18 — End: 2021-05-25

## 2021-05-18 MED ORDER — benzonatate (Tessalon) 100 mg capsule
100 | ORAL_CAPSULE | Freq: Three times a day (TID) | ORAL | 0 refills | 10.00000 days | Status: AC | PRN
Start: 2021-05-18 — End: 2021-06-17

## 2021-05-18 MED ORDER — albuterol (ProAir HFA) 90 mcg/actuation inhaler
90 | RESPIRATORY_TRACT | 0 refills | Status: DC | PRN
Start: 2021-05-18 — End: 2022-05-07

## 2021-05-18 NOTE — Progress Notes (Signed)
[facility]  [facility]  [address]  [address] and [address]  [place] [address]  Dept: [phone]  Dept Fax: [phone]     Patient ID: Kelsey Pace is a 55 y.o. female who presents for No chief complaint on file..    Subjective   HPI    Cough starting the last weekend of April   She went to a convention through her church and travelled out of state  First she lost her voice and started coughing  She took a home COVID test which was negative  She took home cough medicines but every week she feels worse   For 2 days she felt better but now feels worse   She tried home tests 3 separate times   She coughs so much until she feels tight in her chest  She had one episode she felt like she couldn't breath  She does not have any history of asthma or breathing difficulty but has taken albuterol historically when sick  She is no longer congested and coughs up a "very green phlegm"  She did use a proair once with some effect  She is having some chills a couple days ago, but does not take her temperature  No sore throat, congestion is resolved  No chest pain, wheezing        Objective   There were no vitals taken for this visit.    Physical Exam  Constitutional:       General: She is not in acute distress.     Appearance: Normal appearance. She is not ill-appearing, toxic-appearing or diaphoretic.   Eyes:      Extraocular Movements: Extraocular movements intact.   Pulmonary:      Effort: Pulmonary effort is normal.   Neurological:      Mental Status: She is alert.   Psychiatric:         Mood and Affect: Mood normal.         Behavior: Behavior normal.         Thought Content: Thought content normal.         Judgment: Judgment normal.         Assessment/Plan   Cough  -     doxycycline (Vibramycin) 100 mg capsule; Take 1 capsule (100 mg) by mouth in the morning and at bedtime for 7 days. Take with at least 8  ounces (large glass) of water, do not lie down for 30 minutes after  -     albuterol (ProAir HFA) 90 mcg/actuation inhaler; Inhale 2 puffs every 4 (four) hours if needed for wheezing or shortness of breath.  -     benzonatate (Tessalon) 100 mg capsule; Take 1 capsule (100 mg) by mouth if needed in the morning, at noon, and at bedtime for cough. Do not crush or chew.  Cough concerning for pneumonia given long duration  Rx abx as above  Chest xray ordered  Rx albuterol given good response historically   Discussed to go to ED with further breathing difficulties     This real-time, interactive virtual Telehealth encounter was done by video with the patient's verbal consent. Two patient identifiers were used and confirmed. Physical location of the patient: home Patient resides in: [place]  Physical location of the provider: office. Other participants/involvement: none  Total minutes spent: 20

## 2021-05-20 ENCOUNTER — Encounter (INDEPENDENT_AMBULATORY_CARE_PROVIDER_SITE_OTHER): Admitting: Family

## 2021-07-18 ENCOUNTER — Encounter (INDEPENDENT_AMBULATORY_CARE_PROVIDER_SITE_OTHER): Admitting: Family Medicine

## 2021-07-19 ENCOUNTER — Telehealth (INDEPENDENT_AMBULATORY_CARE_PROVIDER_SITE_OTHER): Admitting: Family Medicine

## 2021-07-19 NOTE — Telephone Encounter (Signed)
 Spoke to husband and adv we do not do covid testing orders at all  We [facility] to [facility] and they will order testing if needed after eval  [facility] does not due travel testing either-per BIG sign on their door  She needs to get PCR testing at a pharmacy   Husband will relay info to spouse

## 2021-07-19 NOTE — Telephone Encounter (Signed)
 Patient is calling in that she was advised by [facility] to obtain an order for COVID testing from [place]. Patient states she will be travelling and needs the order. Patient states she has no symptoms.

## 2021-07-30 ENCOUNTER — Encounter (HOSPITAL_BASED_OUTPATIENT_CLINIC_OR_DEPARTMENT_OTHER)

## 2021-07-30 ENCOUNTER — Other Ambulatory Visit (HOSPITAL_BASED_OUTPATIENT_CLINIC_OR_DEPARTMENT_OTHER): Admitting: Family Medicine

## 2021-07-30 ENCOUNTER — Other Ambulatory Visit (INDEPENDENT_AMBULATORY_CARE_PROVIDER_SITE_OTHER): Admitting: Family Medicine

## 2021-08-07 ENCOUNTER — Ambulatory Visit (INDEPENDENT_AMBULATORY_CARE_PROVIDER_SITE_OTHER)

## 2021-08-07 DIAGNOSIS — Z1231 Encounter for screening mammogram for malignant neoplasm of breast: Secondary | ICD-10-CM

## 2021-09-16 ENCOUNTER — Encounter (INDEPENDENT_AMBULATORY_CARE_PROVIDER_SITE_OTHER): Admitting: Family Medicine

## 2022-02-04 ENCOUNTER — Telehealth (INDEPENDENT_AMBULATORY_CARE_PROVIDER_SITE_OTHER): Admitting: Family Medicine

## 2022-02-04 NOTE — Telephone Encounter (Signed)
Called pt and left a voicemail to let her know Dr. Dia Sitter will no longer be available on Fridays. I provided the office number for her to call and reschedule. I will attempt to contact once more.

## 2022-03-08 ENCOUNTER — Telehealth (INDEPENDENT_AMBULATORY_CARE_PROVIDER_SITE_OTHER): Admitting: Family Medicine

## 2022-03-08 NOTE — Telephone Encounter (Addendum)
I left VMx2 regarding canceled 03/22/22 Physical appt as Dr. Dia Sitter will not be at [address] on that day.     Letter sent.

## 2022-03-12 ENCOUNTER — Encounter (INDEPENDENT_AMBULATORY_CARE_PROVIDER_SITE_OTHER): Admitting: Family Medicine

## 2022-03-22 ENCOUNTER — Encounter: Payer: BLUE CROSS/BLUE SHIELD | Attending: Family Medicine | Primary: Family Medicine

## 2022-04-03 NOTE — Telephone Encounter (Signed)
I spoke with pt's husband regarding [employer] coverage. He stated he'd have pt call in to verify insurance info.

## 2022-04-03 NOTE — Telephone Encounter (Signed)
Left voicemail for patient to call back with updated insurance information before appointment on 04/04/22

## 2022-04-04 ENCOUNTER — Ambulatory Visit
Admit: 2022-04-04 | Discharge: 2022-04-04 | Payer: BLUE CROSS/BLUE SHIELD | Attending: Family Medicine | Primary: Student in an Organized Health Care Education/Training Program

## 2022-04-04 DIAGNOSIS — L03211 Cellulitis of face: Secondary | ICD-10-CM

## 2022-04-04 MED ORDER — mupirocin (Bactroban) 2 % cream
2 | Freq: Three times a day (TID) | TOPICAL | 0 refills | Status: DC
Start: 2022-04-04 — End: 2022-04-08

## 2022-04-04 MED ORDER — sulfamethoxazole-trimethoprim (Bactrim DS) 800-160 mg tablet
800-160 | ORAL_TABLET | Freq: Two times a day (BID) | ORAL | 0 refills | Status: AC
Start: 2022-04-04 — End: 2022-04-18

## 2022-04-04 NOTE — Assessment & Plan Note (Signed)
Sending bactrim with mupirocin; reviewed face hygiene, avoid any other topicals, keep skin dry and clean, use gentle cleanser  Advised to contact if not improved by Monday  Advised to go to ED if worsens in the next few days  ED/return precautions reviewed

## 2022-04-04 NOTE — Progress Notes (Signed)
[facility]  [facility]  [address]  [place], [place] [address]  Ph: [phone]      OFFICE VISIT    Patient ID: Kelsey Pace is a 56 y.o. female.    Subjective  HPI    Pt notes for the past few weeks she has had x2 pimples on her left cheek that have gotten bigger and more painful. More recently pain has spread beyond the pimples and deeper in her cheeks up to her nose. Denies any fever, chills, sweat, n/v, confusion, eye pain, syncope/LOC.      Review of Systems   Skin: Positive for rash.   All other systems reviewed and are negative.        History    Patient Active Problem List   Diagnosis   . Benign paroxysmal vertigo, unspecified ear   . COVID-19   . Endometriosis of uterus   . Fasting hyperglycemia   . Headache   . Hypertriglyceridemia   . Obesity, unspecified   . Obstructive sleep apnea (adult) (pediatric)   . Other abnormal and inconclusive findings on diagnostic imaging of breast   . Urinary incontinence   . Diverticula of colon   . Excessive or frequent menstruation   . Migraine headache   . Vitamin D deficiency   . Cellulitis of face     No past medical history on file.  No past surgical history on file.  No family history on file.  Current Outpatient Medications on File Prior to Visit   Medication Sig Dispense Refill   . albuterol (ProAir HFA) 90 mcg/actuation inhaler Inhale 2 puffs every 4 (four) hours if needed for wheezing or shortness of breath. 8.5 g 0   . levonorgestrel (Mirena) 20 mcg/24 hours (7 yrs) 52 mg IUD by intrauterine route in the morning.       No current facility-administered medications on file prior to visit.     ALLERGIES: No known allergies      Objective   Vitals:    04/04/22 1430   BP: 124/74   BP Location: Right arm   Patient Position: Sitting   BP Cuff Size: Large adult   Pulse: 78   Weight: 98 kg   Height: 1.651 m     Body mass index is 35.94 kg/m.  Wt Readings from Last 3 Encounters:   04/04/22 98 kg   02/24/20 96.6  kg     Physical Exam  Vitals and nursing note reviewed.   Constitutional:       Appearance: Normal appearance.   HENT:      Head: Normocephalic and atraumatic.   Pulmonary:      Effort: Pulmonary effort is normal.   Skin:     Comments: pustulopapular x2 lesions on left maxilla without erythema on surrounding skin, no discharge; surrounding maxillary ttp up to nasolabial fold; EOMI, no pain with eye movement   Neurological:      Mental Status: She is alert and oriented to person, place, and time. Mental status is at baseline.   Psychiatric:         Mood and Affect: Mood normal.         Behavior: Behavior normal.         Thought Content: Thought content normal.         Judgment: Judgment normal.             Assessment    Davin was seen today for  acne.  Cellulitis of face  Assessment & Plan:  Sending bactrim with mupirocin; reviewed face hygiene, avoid any other topicals, keep skin dry and clean, use gentle cleanser  Advised to contact if not improved by Monday  Advised to go to ED if worsens in the next few days  ED/return precautions reviewed  Orders:  -     sulfamethoxazole-trimethoprim (Bactrim DS) 800-160 mg tablet; Take 1 tablet by mouth twice daily for 14 days.  -     mupirocin (Bactroban) 2 % cream; Apply topically three times daily for 10 days.           Signature:  Sherlynn Carbon, MD  Family Medicine  04/04/2022 3:41 PM

## 2022-04-04 NOTE — Telephone Encounter (Signed)
Verified that [employer] coverage terminated 06/29/21. Left VM informing pt that they are marked as self pay.

## 2022-04-05 NOTE — Telephone Encounter (Signed)
Called and spoke with patient. She does not want a PA done. She wants a different medication sent over instead  bc she doesn't want to wait for approval.     Fwd to PCP

## 2022-04-05 NOTE — Telephone Encounter (Signed)
Called and spoke with husband of patient. Patient was not available to speak at the moment.  Informed him that RX can be bought OTC.    Closing note.

## 2022-04-05 NOTE — Telephone Encounter (Signed)
She can use bacitracin over the counter

## 2022-04-05 NOTE — Telephone Encounter (Signed)
Patient was prescribed two medication after her 4/6 appointment with Dr. Dia SitterAbbas. She was prescribed Bactrim and Bactroban. She was only able to pick up the Bactrim. Under her medication list the PA Status for the Bactroban was denied. Please advise.     Call back: [phone]

## 2022-04-08 ENCOUNTER — Inpatient Hospital Stay: Admit: 2022-04-08 | Discharge: 2022-04-08 | Disposition: A | Discharge status: Home / Self Care

## 2022-04-08 DIAGNOSIS — L0201 Cutaneous abscess of face: Secondary | ICD-10-CM

## 2022-04-08 MED ORDER — mupirocin (Bactroban) 2 % cream
2 | Freq: Three times a day (TID) | TOPICAL | 0 refills | Status: AC
Start: 2022-04-08 — End: 2022-04-18

## 2022-04-08 MED ORDER — doxycycline (Vibramycin) 100 mg capsule
100 | ORAL_CAPSULE | Freq: Two times a day (BID) | ORAL | 0 refills | Status: AC
Start: 2022-04-08 — End: 2022-04-15

## 2022-04-08 NOTE — Telephone Encounter (Signed)
 Patient would like to speak to Retta Mac out of office today.   Routing to PCP Back-up for coverage.  Medication pt is asking about: Sulfamethoxazole-trimethoprim.    Asking for something else to help,called pt and spoke to her and  pt went to ER stated waited over 3 hours to have to leave as pt was uncomfortable and didn't want to wait anymore.  Spoke to [facility], going to speak to pt regarding what is going on.

## 2022-04-08 NOTE — ED Provider Notes (Signed)
 Emergency Department Note  [facility]    Chief Complaint   Patient presents with   . Facial problem        HISTORY OF PRESENT ILLNESS  Patient is a 56 year old female with recent diagnosis of cystic lesion to left cheek concerning for infection presents for repeat evaluation.  States she developed a pimple-like lesion to her left cheek 1 week ago, spoke with primary care doctor who placed her on Bactrim and prescribed mupirocin.  Has taken 4 days worth of Bactrim with mild improvement of cystic lesion however states she now has increased pain along her left cheek, radiating into her nose.  Area is somewhat tender to touch however no significant swelling to the face or erythema to check or nose.  There is a whitehead however no active drainage.  Has not started the mupirocin ointment.  Presenting for evaluation and management.    Denies fevers, chills, nausea, vomiting, abdominal pain or diarrhea.  Denies history of diabetes or MRSA.          PAST MEDICAL HISTORY  Patient Active Problem List   Diagnosis   . Benign paroxysmal vertigo, unspecified ear   . COVID-19   . Endometriosis of uterus   . Fasting hyperglycemia   . Headache   . Hypertriglyceridemia   . Obesity, unspecified   . Obstructive sleep apnea (adult) (pediatric)   . Other abnormal and inconclusive findings on diagnostic imaging of breast   . Urinary incontinence   . Diverticula of colon   . Excessive or frequent menstruation   . Migraine headache   . Vitamin D deficiency   . Cellulitis of face       SURGICAL/FAM/SOCIAL HISTORY  History reviewed. No pertinent surgical history.  No family history on file.  Social History     Tobacco Use   . Smoking status: Never   . Smokeless tobacco: Never   Vaping Use   . Vaping status: Not on file   Substance Use Topics   . Alcohol use: Not on file   . Drug use: Not on file       MEDICATIONS  Prior to Admission medications    Medication Sig Start Date End Date Taking? Authorizing Provider   albuterol  (ProAir HFA) 90 mcg/actuation inhaler Inhale 2 puffs every 4 (four) hours if needed for wheezing or shortness of breath. 05/18/21 05/18/22  Braulio Conte, NP   doxycycline (Vibramycin) 100 mg capsule Take 1 capsule (100 mg) by mouth twice daily for 7 days. Take with at least 8 ounces (large glass) of water, do not lie down for 30 minutes after 04/08/22 04/15/22  Renetta Chalk, PA   levonorgestrel (Mirena) 20 mcg/24 hours (7 yrs) 52 mg IUD by intrauterine route in the morning. 09/22/13   Historical Provider, MD   mupirocin (Bactroban) 2 % cream Apply topically three times daily for 10 days. 04/08/22 04/18/22  Nelson Chimes, MD   sulfamethoxazole-trimethoprim (Bactrim DS) 800-160 mg tablet Take 1 tablet by mouth twice daily for 14 days. 04/04/22 04/18/22  Sherlynn Carbon, MD   mupirocin (Bactroban) 2 % cream Apply topically three times daily for 10 days. 04/04/22 04/08/22  Sherlynn Carbon, MD      Allergies   Allergen Reactions   . No Known Allergies        REVIEW OF SYSTEMS  Review of Systems   Constitutional: Negative for chills and fever.   HENT: Negative for ear pain, facial swelling and sore throat.    Eyes:  Negative for pain and visual disturbance.   Respiratory: Negative for cough and shortness of breath.    Cardiovascular: Negative for chest pain and palpitations.   Gastrointestinal: Negative for abdominal pain, nausea and vomiting.   Genitourinary: Negative for dysuria and hematuria.   Musculoskeletal: Negative for arthralgias and back pain.   Skin: Negative for color change and rash.        Small left cheek abscess   Neurological: Negative for headaches.   All other systems reviewed and are negative.      PHYSICAL EXAM  ED Triage Vitals [04/08/22 1058]   Temp Pulse Resp BP   36.8 C (98.2 F) 80 18 120/79      SpO2 Temp Source Heart Rate Source Patient Position   98 % Temporal -- --      BP Location FiO2 (%)     -- --       Physical Exam  Constitutional:       General: She is not in acute distress.     Appearance: She is  normal weight.   HENT:      Head: Normocephalic and atraumatic.      Nose: Nose normal.      Mouth/Throat:      Mouth: Mucous membranes are moist.   Eyes:      Conjunctiva/sclera: Conjunctivae normal.   Cardiovascular:      Rate and Rhythm: Normal rate.      Pulses: Normal pulses.   Pulmonary:      Effort: Pulmonary effort is normal.   Musculoskeletal:         General: Normal range of motion.      Cervical back: Normal range of motion.   Skin:     General: Skin is warm and dry.      Capillary Refill: Capillary refill takes less than 2 seconds.   Neurological:      General: No focal deficit present.      Mental Status: She is alert.   Psychiatric:         Mood and Affect: Mood normal.         Behavior: Behavior normal.             TESTING RESULTS  PROCEDURES  Labs Reviewed   WOUND CULTURE   ANAEROBIC CULTURE     No orders to display     Procedures    ED COURSE/MDM  Diagnoses as of 04/08/22 2032   Abscess of left external cheek     ED Course & MDM   Medical Decision Making  Patient is a 56 year old female presenting for repeat evaluation after being diagnosed with cystic lesion to the left cheek concerning for infection.  On exam she has a small superficial abscess to the left cheek which has a whitehead.  Area is mildly tender to touch however there is no significant surrounding erythema or induration.  No distinct facial swelling. No systemic symptoms and visual appearance of lesion has improved per patient. I do not feel blood work or imaging is warranted and patient agrees.      Management options were discussed and she verbally consented to unroofing whitehead to see if we can get any pustular drainage out.  This was performed easily without significant difficulty.  Cultures were sent off, results are pending.  Given mild increased pain plan to discontinue Bactrim and place patient on doxycycline.  She is advised to use warm compresses a few times daily to help with drainage.  Advised  to follow-up with primary  care doctor as soon as possible for close follow-up.  If she has increased pain, redness, going to the face or acute worsening condition advised to return immediately.  All questions answered.    Abscess of left external cheek: complicated acute illness or injury  Amount and/or Complexity of Data Reviewed  Labs: ordered.      Risk  Prescription drug management.          Discharge Medication List as of 04/08/2022 11:41 AM      START taking these medications    Details   doxycycline (Vibramycin) 100 mg capsule Take 1 capsule (100 mg) by mouth twice daily for 7 days. Take with at least 8 ounces (large glass) of water, do not lie down for 30 minutes after, Starting Mon 04/08/2022, Until Mon 04/15/2022, Normal              DIAGNOSIS  Repeat evaluation, superficial left cheek abscess  Wound cultures    CONDITION  Fair    DISPOSITION  Home with primary care follow-up    Patient informed of evaluation results.  I have answered all questions.  Patient told to inform primary care doctor today of this ED visit and to obtain follow-up visit or return if unable to see primary care doctor.  Patient told to seek medical attention immediately for any further concerns, worsening of condition, or not getting better in the expected time thought to. Patient has been discharged.         Renetta Chalk, [place]  04/08/22 2032

## 2022-04-08 NOTE — Telephone Encounter (Signed)
 Patient states that the medication she was given in her past office visit is not helping her much.  Patient states that she would like to speak to Dr. Dia Sitter.    Call back [phone]

## 2022-04-08 NOTE — ED Triage Notes (Signed)
 Pt presents to the ER complaining of lesion on the left side of her face. Pt states last week she started having pain on the left side of her face, states she started palpating her face, had a spot on the left side of he cheek that was hard and painful. States went to her PCP and she was prescribed bactrim, states Saturday it drained and she was doing home treatment on it. States it is still painful and the spot is open, she is concerned for infection.

## 2022-04-08 NOTE — Discharge Instructions (Signed)
 You can discontinue the sulfamethoxazole/trimethoprim (Bactrim) and start taking doxycycline 100 mg every 12 hours for the next 7 days.  Warm compresses 2-3 times daily to help with drainage.  Return with acute change or worsening condition particularly increased facial swelling, increased pain, fevers or acute concern.  Otherwise follow-up with primary care doctor soon as possible.

## 2022-04-08 NOTE — Telephone Encounter (Signed)
 Started Bactrim. Mupirocin was not available at pharmacy, was not able to get it for unclear reason.    Reviewed her sx - she feels bactrim has not helped. Sx worse. Describes pain with ocular movements. Has opened up and now with some drainage as well. Given ocular sx I did recommend ER, will likely need a CT and at least IV abx. She is agreeable with plan.     Bri - will you please call the pharmacy to see what the problem was with the mupirocin? If she is discharged, I would like this to still be available for her to use. Thanks.

## 2022-04-08 NOTE — Telephone Encounter (Signed)
 Spoke to the pharmacy  Stated that this medication at the time was not covered, than stated they will get it ready as he was able to fix the mistake. Medication was sent as i spoke to pharmacy about issue.  Pt has been informed and also going to the ER.  No further actions. Closing note.

## 2022-04-09 NOTE — Telephone Encounter (Signed)
Medication needs Prior Auth.  ZOX:[id number]  ROUTING TO LORI

## 2022-04-09 NOTE — ED Notes (Signed)
Call made.  Pt states is doing a little better.  Still uncomfortable.  Taking her antibiotics without difficulty.  Advised to return to ED with any further problems or concerns.     Dalene Carrow, RN  04/09/22 1110

## 2022-04-10 LAB — WOUND CULTURE
Gram Stain Result: NONE SEEN
Wound Culture: NO GROWTH

## 2022-04-12 NOTE — Telephone Encounter (Signed)
This was previously denied electronically due to the patient needing to try the ointment first. If you look under the medication the denial letter is there as well. Thank you

## 2022-04-12 NOTE — Telephone Encounter (Signed)
Pt has been informed.  She will try that first.  No further actions. Closing note.

## 2022-04-13 LAB — ANAEROBIC CULTURE
ANAEROBIC CULTURE: NO GROWTH
Gram Stain Result: NONE SEEN

## 2022-05-07 ENCOUNTER — Ambulatory Visit: Admit: 2022-05-07 | Discharge: 2022-05-07 | Attending: Family Medicine | Primary: Family Medicine

## 2022-05-07 DIAGNOSIS — Z0000 Encounter for general adult medical examination without abnormal findings: Secondary | ICD-10-CM

## 2022-05-07 NOTE — Assessment & Plan Note (Signed)
Ordering fasting lipids today

## 2022-05-07 NOTE — Assessment & Plan Note (Signed)
No longer using cpap machine, reports having normal breathing and sleep; declines follow up

## 2022-05-07 NOTE — Assessment & Plan Note (Signed)
Was prev given mupirocin and bactrim- had worsened pain and went to UC where rx was changed to doxycycline and white head was unroofed with pustular drainage  Pt has upcoming appt tomorrow with Dermatology- referral placed  ED/return precautions discussed

## 2022-05-07 NOTE — Assessment & Plan Note (Signed)
Asymptomatic, reports high readings at home  Pt to come in 1-2wk for repeat BP to determine treatment options  Discussed low salt diet, weight mgmnt/exercise, nutrition referral placed  ED/return precautions reviewed

## 2022-05-07 NOTE — Progress Notes (Signed)
[facility]  [facility]  [address]  [place], [place] [address]  Ph: [phone]      ANNUAL PHYSICAL EXAM     Patient ID: Kelsey Pace is a 56 y.o. female.    Subjective    HPI  - BP: elevated - asymptomatic  - HLD/ASCVD: will do fasting labs  - DM: will do fasting labs  - Tdap: 11/11/2015  - Eye exam: will schedule  - Dentist: goes q80mon  - Shingles/zostavx: #1 today  - Pap: pt will schedule  - Mammogram: ordered  - Colonoscopy: 02/24/2020 - repeat 29yr  - Smoking: never    Diet: amenable to nutrition referral  Exercise: walks 2-3x/wk, gym other days  Sleep: no concerns  Safety/social: lives with husband and daughter's family    1. Face rash/pain improved after keflex- has some remaining discomfort on b/l maxillary sinus. Pt has appt tomorrow with Derm and needs referral signed today.    2. Weight: pt has tried going to gym regularly, daily exercise, cutting back on diet- no improvement in weight mgmnt.      Review of Systems   Constitutional:        See HPI   All other systems reviewed and are negative.        History    Patient Active Problem List   Diagnosis   . Endometriosis of uterus   . Hypertriglyceridemia   . Class 2 obesity due to excess calories without serious comorbidity with body mass index (BMI) of 38.0 to 38.9 in adult   . Obstructive sleep apnea (adult) (pediatric)   . Diverticula of colon   . Migraine headache   . Vitamin D deficiency   . Cellulitis of face   . Elevated blood pressure reading in office without diagnosis of hypertension     Past Medical History:   Diagnosis Date   . Bilateral ovarian cysts    . Hypertriglyceridemia    . Obesity    . OSA (obstructive sleep apnea)      No past surgical history on file.  Family History   Problem Relation Name Age of Onset   . Hypertension Mother     . Arrhythmia Mother          pacemaker   . Other (terrorist victim) Father  41        [place]   . Asthma Sister     . Asthma  Brother     . Hypertension Brother     . Asthma Maternal Grandmother       Current Outpatient Medications on File Prior to Visit   Medication Sig Dispense Refill   . levonorgestrel (Mirena) 20 mcg/24 hours (7 yrs) 52 mg IUD by intrauterine route in the morning.     . [DISCONTINUED] albuterol (ProAir HFA) 90 mcg/actuation inhaler Inhale 2 puffs every 4 (four) hours if needed for wheezing or shortness of breath. 8.5 g 0     No current facility-administered medications on file prior to visit.     ALLERGIES: No known allergies    Health Maintenance   Topic Date Due   . MMR Vaccines (1 of 1 - Standard series) Never done   . Hepatitis B Vaccines (2 of 3 - Hep B Twinrix 3-dose series) 12/26/2015   . Mammogram  Never done   . COVID-19 Vaccine (4 - Booster for Pfizer series) 02/10/2021   . Cervical Cancer Screening  11/18/2021   .  HIV Screening  05/08/2023 (Originally 06-Sep-1966)   . Hepatitis C Screening  05/08/2023 (Originally 05/31/1984)   . Zoster Vaccines (2 of 2) 07/02/2022   . Influenza Vaccine (Season Ended) 08/30/2022   . Diabetes Screening  12/19/2022   . Lipid Panel  12/19/2024   . DTaP/Tdap/Td Vaccines (4 - Td or Tdap) 10/30/2025   . Colorectal Cancer Screening  02/23/2030   . HIB Vaccines  Aged Out   . IPV Vaccines  Aged Out   . Hepatitis A Vaccines  Aged Out   . Meningococcal Vaccine  Aged Out   . Rotavirus Vaccines  Aged Out   . HPV Vaccines  Aged Out   . Pneumococcal Vaccine: Pediatrics (0 to 5 Years) and At-Risk Patients (6 to 76 Years)  Aged [employer] Due   Topic Date Due   . MMR Vaccines (1 of 1 - Standard series) Never done   . Hepatitis B Vaccines (2 of 3 - Hep B Twinrix 3-dose series) 12/26/2015   . Mammogram  Never done   . COVID-19 Vaccine (4 - Booster for Pfizer series) 02/10/2021   . Cervical Cancer Screening  11/18/2021     Immunization History   Administered Date(s) Administered   . COVID-19 Pfizer Monovalent 04/01/2020, 04/29/2020, 12/16/2020   . Hep A / Hep B 11/28/2015   .  Influenza, Unspecified 11/02/2015, 11/01/2019   . Influenza, injectable, quadrivalent, preservative free 11/18/2016   . Influenza, live, intranasal, quadrivalent 10/11/2014   . Influenza, seasonal, injectable 01/05/2007, 02/03/2009, 02/05/2010, 10/31/2011   . Novel Influenza-H1N1-09, all formulations 02/03/2009   . Novel influenza-H1N1-09 02/03/2009   . Td (adult), unspecified 01/05/2007   . Tdap 01/05/2008, 01/15/2008, 10/31/2015   . Typhoid, Unspecified 11/28/2015   . Typhoid, ViCPs 11/28/2015   . Zoster, Recombinant 05/07/2022         Objective   Vitals:    05/07/22 1403   BP: (!) 138/92   Temp: 37.1 C (98.7 F)   TempSrc: Oral   SpO2: 98%   Weight: 98.3 kg   Height: 1.6 m     Body mass index is 38.4 kg/m.  Wt Readings from Last 3 Encounters:   05/07/22 98.3 kg   04/08/22 98.9 kg   04/04/22 98 kg     Physical Exam  Vitals and nursing note reviewed.   Constitutional:       Appearance: Normal appearance. She is obese.   HENT:      Head: Normocephalic and atraumatic.      Right Ear: Tympanic membrane, ear canal and external ear normal.      Left Ear: Tympanic membrane, ear canal and external ear normal.      Nose: Nose normal.      Mouth/Throat:      Mouth: Mucous membranes are moist.      Pharynx: Oropharynx is clear.   Eyes:      Extraocular Movements: Extraocular movements intact.      Conjunctiva/sclera: Conjunctivae normal.      Pupils: Pupils are equal, round, and reactive to light.   Cardiovascular:      Rate and Rhythm: Normal rate and regular rhythm.      Pulses: Normal pulses.      Heart sounds: Normal heart sounds.   Pulmonary:      Effort: Pulmonary effort is normal.      Breath sounds: Normal breath sounds.   Abdominal:      General: Abdomen is flat. Bowel sounds  are normal.      Palpations: Abdomen is soft.   Musculoskeletal:         General: Normal range of motion.      Cervical back: Normal range of motion and neck supple.   Skin:     General: Skin is warm.      Capillary Refill: Capillary refill  takes less than 2 seconds.      Comments: Hyperpigmented scar on left cheek from recent infection; erythema and edema resolved   Neurological:      General: No focal deficit present.      Mental Status: She is alert and oriented to person, place, and time. Mental status is at baseline.   Psychiatric:         Mood and Affect: Mood normal.         Behavior: Behavior normal.         Thought Content: Thought content normal.         Judgment: Judgment normal.             Assessment    Annual physical exam  Comments:  Reviewed diet, exercise, lifestyle  Pt up to date on screening/immunizations- see HPI for details  Orders:  -     CBC; Future  -     Comprehensive metabolic panel  -     Lipid panel  -     Hemoglobin A1c  -     BI BILATERAL MAMMOGRAM SCREENING TOMOSYNTHESIS; Future  Elevated blood pressure reading in office without diagnosis of hypertension  Assessment & Plan:  Asymptomatic, reports high readings at home  Pt to come in 1-2wk for repeat BP to determine treatment options  Discussed low salt diet, weight mgmnt/exercise, nutrition referral placed  ED/return precautions reviewed  Cellulitis of face  Assessment & Plan:  Was prev given mupirocin and bactrim- had worsened pain and went to UC where rx was changed to doxycycline and white head was unroofed with pustular drainage  Pt has upcoming appt tomorrow with Dermatology- referral placed  ED/return precautions discussed  Orders:  -     Ambulatory referral to Dermatology; Future  Class 2 obesity due to excess calories without serious comorbidity with body mass index (BMI) of 38.0 to 38.9 in adult  Assessment & Plan:  Discussed diet, exercise- pt has been working on both without result  Ordering labs, placing nutrition eval  Orders:  -     Ambulatory referral to Nutrition Services; Future  Encounter for screening mammogram for malignant neoplasm of breast  -     BI BILATERAL MAMMOGRAM SCREENING TOMOSYNTHESIS; Future  Obstructive sleep apnea (adult)  (pediatric)  Assessment & Plan:  No longer using cpap machine, reports having normal breathing and sleep; declines follow up  Hypertriglyceridemia  Assessment & Plan:  Ordering fasting lipids today  Other orders  -     Zoster, Recombinant (Shingrix)           Signature:  Sherlynn CarbonShams Kaelei Wheeler, MD  Family Medicine  05/07/2022 3:01 PM

## 2022-05-07 NOTE — Assessment & Plan Note (Signed)
Discussed diet, exercise- pt has been working on both without result  Ordering labs, placing nutrition eval

## 2022-05-11 ENCOUNTER — Inpatient Hospital Stay: Admit: 2022-05-11 | Primary: Student in an Organized Health Care Education/Training Program

## 2022-05-11 DIAGNOSIS — Z1231 Encounter for screening mammogram for malignant neoplasm of breast: Secondary | ICD-10-CM

## 2022-05-11 DIAGNOSIS — Z0000 Encounter for general adult medical examination without abnormal findings: Secondary | ICD-10-CM

## 2022-05-13 ENCOUNTER — Other Ambulatory Visit: Admit: 2022-05-13 | Primary: Family Medicine

## 2022-05-13 DIAGNOSIS — Z0000 Encounter for general adult medical examination without abnormal findings: Secondary | ICD-10-CM

## 2022-05-13 LAB — COMPREHENSIVE METABOLIC PANEL
ALT: 28 U/L (ref 0–55)
AST: 23 U/L (ref 6–42)
Albumin: 3.7 g/dL (ref 3.2–5.0)
Alkaline phosphatase: 104 U/L (ref 30–130)
Anion Gap: 3 mmol/L (ref 3–14)
BUN: 19 mg/dL (ref 6–24)
Bilirubin, total: 0.4 mg/dL (ref 0.2–1.2)
CO2 (Bicarbonate): 28 mmol/L (ref 20–32)
Calcium: 9.1 mg/dL (ref 8.5–10.5)
Chloride: 111 mmol/L — ABNORMAL HIGH (ref 98–110)
Creatinine: 0.88 mg/dL (ref 0.55–1.30)
Glucose: 87 mg/dL (ref 70–99)
Potassium: 3.8 mmol/L (ref 3.6–5.2)
Protein, total: 7 g/dL (ref 6.0–8.4)
Sodium: 142 mmol/L (ref 135–146)
eGFRcr: 78 mL/min/{1.73_m2} (ref >=60)

## 2022-05-13 LAB — LIPID PANEL
Cholesterol: 164 mg/dL (ref <=200)
HDL cholesterol: 38 mg/dL — ABNORMAL LOW (ref >=40)
LDL cholesterol, calculated: 98 mg/dL (ref 0–130)
Triglycerides: 141 mg/dL (ref <=150)

## 2022-05-13 LAB — CBC
Hematocrit: 42.7 % (ref 32.0–47.0)
Hemoglobin: 13.8 g/dL (ref 11.0–16.0)
MCH: 30.5 pg (ref 26.0–34.0)
MCHC: 32.3 g/dL (ref 31.0–37.0)
MCV: 94.3 fL (ref 80.0–100.0)
MPV: 10.1 fL (ref 9.1–12.4)
NRBC %: 0 % (ref 0.0–0.0)
NRBC Absolute: 0 10*3/uL (ref 0.00–2.00)
Platelets: 233 10*3/uL (ref 150–400)
RBC: 4.53 M/uL (ref 3.70–5.20)
RDW-CV: 12.8 % (ref 11.5–14.5)
RDW-SD: 44 fL (ref 35.0–51.0)
WBC: 6.3 10*3/uL (ref 4.0–11.0)

## 2022-05-13 LAB — HEMOGLOBIN A1C: HEMOGLOBIN A1C % (INT/EXT): 5.6 % — ABNORMAL HIGH (ref <5.6)

## 2022-05-15 ENCOUNTER — Ambulatory Visit: Attending: Nurse Practitioner | Primary: Family Medicine

## 2022-05-22 ENCOUNTER — Encounter: Attending: Family Medicine | Primary: Student in an Organized Health Care Education/Training Program

## 2022-05-29 ENCOUNTER — Institutional Professional Consult (permissible substitution)
Admit: 2022-06-24 | Attending: Nurse Practitioner | Primary: Student in an Organized Health Care Education/Training Program

## 2022-05-29 DIAGNOSIS — E6609 Other obesity due to excess calories: Secondary | ICD-10-CM

## 2022-05-29 DIAGNOSIS — Z6838 Body mass index (BMI) 38.0-38.9, adult: Secondary | ICD-10-CM

## 2022-05-29 NOTE — Progress Notes (Signed)
[facility]  [address]  [place] [address]  [phone]         Outpatient Nutrition Assessment  Name: Kelsey Pace  MRN: [id number]  DOB: [birth year]    Date Seen: 05/29/2022    Chief Complaint    Nutrition Counseling; Obesity         Sherlynn Carbon, MD  Visit Type: In Person    Allergies   Allergen Reactions   . No Known Allergies        Pertinent Medications  Current Outpatient Medications   Medication Instructions   . levonorgestrel (Mirena) 20 mcg/24 hours (7 yrs) 52 mg IUD intrauterine, Daily       Pertinent  Labs  Lab Results   Component Value Date    HGBA1C 5.6 (H) 05/13/2022     Lab Results   Component Value Date    CHOL 164 05/13/2022     Lab Results   Component Value Date    HDL 38 (L) 05/13/2022     Lab Results   Component Value Date    LDLCALC 98 05/13/2022     Lab Results   Component Value Date    TRIG 141 05/13/2022     No results found for: CHOLHDL      Past Medical History:   Diagnosis Date   . Bilateral ovarian cysts    . Hypertriglyceridemia    . Obesity    . OSA (obstructive sleep apnea)        Assessment  Food Intake:    Food allergies: none  Pain with intake/digestion: none  Supplements: none     Diet Recall:  B: toast w/ PB or avocado, coffee, smoothie: orange juice, apple, grape, orange, celery, or spinach, apple, lemon pineapple  L: skips, busy at work  D 4PM: fajita salad w/ chicken, peppers, onion  S: (occassionally) peanuts, milk and cereal    Food Frequency:  Whole grains: daily  Fruit: daily blue berries  Vegetables: daily  Meat: daily  Nuts/seeds: rarely   Beans/Legumes: 1-2x/week  Eggs: 1-2x/week  Dairy: 2x/week  Sweet snacks: 2x/week  Salty snacks: rarely    Water: "a lot of water" ~40 oz  Juice/soda: none  Tea/Coffee: coffee daily (black)  Alcohol: none    Fried foods: rarely  Take out/restaurant food: 2-3x/month    Meal pattern:  Weekdays: 2 meals /day + sometimes snack  Weekend: 2 meals/day + sometimes snack    Portion Sizes: moderate  Speed of Eating:  moderate  Eating triggers: hunger, habit,     Lifestyle/Social:  Live with husband, daughter and her husband and grandaughter  Work [employer]  Sleep: "very good"  Stress: "I think good"          Physical Activity  Type of Physical Activity: on feet at work, cooking, cleaning       Anthropometrics  Height Assessment Utilized: Patient reported  Height: 162.6 cm  Weight: 97.4 kg (217)  Weight Method: Standing scale  IBW (kg) (Calculated) : 54.7 kg  BMI (Calculated): 36.83  Growth Pattern/Weight History: UBW before pandemic: 180-185#, UBW after pandemic: 220#      Assessment and Summary  Kelsey Pace is a 56 y.o. female who presents for nutrition counseling for weight management. Reports multiple unsuccessful weight loss attempts. She currently tries to limit Limits bread and tortillas. She recently started working part time. She is on her feet at work, but it is hard to find time to go to the gym. She has  enjoyed kick boxing in the past. Diet recall shows 2 meals/day most days, generally skips lunch d/t work schedule. Dinner meals are well balanced. Tends to snack in evening occassionally. Pt would likely benefit from eating more frequently, increase fruit/veggies and protein. Discussed plate method, adding protein to breakfast, brainstormed balanced snack options. Encouraged physical activity Handouts provided.     Nutrition Diagnosis  Nutrition Diagnosis  Nutrition Diagnosis: Overweight/obesity  Related to: long term struggle with weight, physical inactivity  As Evidenced By: BMI, pt report  DX Continues : Yes    Interventions   Interventions  Nutrition Interventions: General/healthful diet, Nutrition counseling, Recommended modifications    Recommendations  Recommendations  General/Healthful Diet : plate method: 1/2 plate veggies, choose lean protein and fiber rich carbohydrates  Recommended Modifications: eat every 3-4 hours, increase physical activity  Nutrition Counseling: mindful of snacking,    Goals  Add protein at  breakfast  Add snack in afternoon if unable to eat breakfast  Utilize plate method to build balanced meals  Increase physical activity as able    Monitoring/Evaluation   Monitor/Evaluation: intake, weight trends, labs, implementation of nutrition recommendation/goals    Visit Information / Follow Up  Visit Details  Start Time/End Time : 3:30-4:00    Follow up in about 8 weeks (around 07/24/2022) for Next scheduled follow-up.    Hilliard ClarkHeather Argelio Granier, RD

## 2022-07-24 ENCOUNTER — Ambulatory Visit: Attending: Nurse Practitioner | Primary: Student in an Organized Health Care Education/Training Program

## 2022-10-08 ENCOUNTER — Inpatient Hospital Stay
Admit: 2022-10-08 | Disposition: A | Discharge status: Home / Self Care | Primary: Student in an Organized Health Care Education/Training Program

## 2022-10-08 ENCOUNTER — Ambulatory Visit: Admit: 2022-10-08 | Primary: Student in an Organized Health Care Education/Training Program

## 2022-10-08 DIAGNOSIS — S42141A Displaced fracture of glenoid cavity of scapula, right shoulder, initial encounter for closed fracture: Secondary | ICD-10-CM

## 2022-10-08 NOTE — Telephone Encounter (Signed)
Previous patient of dr Dia Sitter would like to establish care with dr Danielle Dess.     Patient reports falling about two weeks ago.  Patient did not go to ER/UC. Patient reports having back and right shoulder pain.  Patient is unable to move right arm much without pain.     Patient has taken advil, aleve, tylenol     Please call patient to triage and schedule.  Insurance verified     Callback:  [phone]

## 2022-10-08 NOTE — Discharge Instructions (Signed)
Rest and ice the area. Sling for comfort. Gently move your shoulder several times a day. Use Advil (ibuprofen) 600mg  4 times a day as needed for pain. Use Tylenol (acetaminophen) 1,000mg  3 times a day as needed for pain. Follow with Orthopedics this week. Return to care with any worsening symptoms or any concern.

## 2022-10-08 NOTE — Telephone Encounter (Signed)
Fell down stairs 2 weeks ago  Denies hitting head  Landed on R shoulder  Able to move it a little but tries to lift arm and can't    R upper back pain  Aleve Advil tylenol and hot packs  Thought was getting better but woke in a lot of pain this morning in her R shoulder     [facility]  Pt will go today and CB for f/u appt with D rCammarata

## 2022-10-08 NOTE — ED Provider Notes (Signed)
Urgent Care Note  [facility]    History  Chief Complaint   Patient presents with   . right shoulder pain s/p trauma       Patient is a 56 year old female who comes in with report of right shoulder pain after fall 1 week ago.  States she did not hit her head or lose consciousness during this fall.  States she has not had headache or changes in vision since this time.  States she has pain in her right shoulder and difficulty moving her shoulder due to pain.  Denies numbness or weakness.  Denies wound.      History provided by:  Patient  Language interpreter used: No        Past Medical History:   Diagnosis Date   . Bilateral ovarian cysts    . Hypertriglyceridemia    . Obesity    . OSA (obstructive sleep apnea)      History reviewed. No pertinent surgical history.  Family History   Problem Relation Name Age of Onset   . Hypertension Mother     . Arrhythmia Mother          pacemaker   . Other (terrorist victim) Father  36        [place]   . Asthma Sister     . Asthma Brother     . Hypertension Brother     . Asthma Maternal Grandmother     . Breast cancer Neg Hx       Social History     Tobacco Use   . Smoking status: Never   . Smokeless tobacco: Never   Substance Use Topics   . Alcohol use: Not on file   . Drug use: Not on file       Review of Systems  Review of Systems   Musculoskeletal: Positive for arthralgias.   Skin: Negative for wound.   Neurological: Negative for weakness and numbness.       Physical Exam  ED Triage Vitals [10/08/22 1552]   Temp Pulse Resp BP   36.5 C (97.7 F) 74 20 130/87      SpO2 Temp src Heart Rate Source Patient Position   98 % -- -- Sitting      BP Location FiO2 (%)     Left arm --         Physical Exam  Vitals and nursing note reviewed.   Constitutional:       Appearance: Normal appearance.   HENT:      Head: Normocephalic and atraumatic.      Right Ear: External ear normal.      Left Ear: External ear normal.      Nose: Nose normal.      Mouth/Throat:      Mouth:  Mucous membranes are moist.   Eyes:      Conjunctiva/sclera: Conjunctivae normal.   Cardiovascular:      Pulses: Normal pulses.   Pulmonary:      Effort: Pulmonary effort is normal.   Musculoskeletal:         General: Tenderness (Right shoulder) present. No swelling.      Cervical back: Neck supple.      Comments: Range of motion right shoulder limited due to pain   Skin:     Findings: No rash.   Neurological:      Mental Status: She is alert.      Sensory: No sensory deficit.   Psychiatric:  Mood and Affect: Mood normal.         Behavior: Behavior normal.              Results  XR SHOULDER RIGHT 2+ VIEWS   Final Result   Small fracture of the inferior margin of the glenoid.  Hill-Sachs deformity         Rosita Fire, MD 10/08/2022 4:44 PM        Labs Reviewed - No data to display    Procedures  Procedures    UC Course  Diagnoses as of 10/08/22 1742   Acute pain of right shoulder   Glenoid fracture of shoulder, right, closed, initial encounter     Medications - No data to display    Medical Decision Making  Patient is a 56 year old female who comes in with report of right shoulder pain after a fall a week ago.  Patient did have tenderness to palpation over the right shoulder and some limited range of motion due to pain.  She was neurovascularly intact.  Independent interpretation of right shoulder x-ray by myself showed a small fracture of the inferior margin of the glenoid, confirmed by radiology with noted Hill-Sachs deformity.  Discussed with ER attending Dr. Levell July, who agrees that patient can be placed in a sling and followed up with orthopedics.  Patient was provided with sling and advised to perform gentle range of motion of her shoulder several times a day.  Advised to rest, ice, and use Advil and Tylenol as needed for pain control.  Advised to follow with orthopedics this week.  Hospitalization not necessary as patient is safe for discharge and appropriate for outpatient management.  Patient was  advised to follow up with primary care. Patient advised to return to care with any worsening symptoms or any concern. Patient verbalized understanding of plan and is in agreement. All questions were answered. Written discharge instructions were provided. Patient was discharged.    Acute pain of right shoulder: complicated acute illness or injury  Glenoid fracture of shoulder, right, closed, initial encounter: complicated acute illness or injury  Amount and/or Complexity of Data Reviewed  Radiology: ordered and independent interpretation performed.      Risk  OTC drugs.          Discharge Medication List as of 10/08/2022  4:57 PM          This dictation was created through voice recognition. The dictation is proof read, however, grammatical errors and voice recognition errors could still be present. Please contact me about any errors.     Harrell Gave, [place]  10/08/22 1742

## 2022-10-09 ENCOUNTER — Ambulatory Visit
Admit: 2022-10-09 | Discharge: 2022-10-09 | Attending: Hand Surgery | Primary: Student in an Organized Health Care Education/Training Program

## 2022-10-09 DIAGNOSIS — S46011A Strain of muscle(s) and tendon(s) of the rotator cuff of right shoulder, initial encounter: Secondary | ICD-10-CM

## 2022-10-09 NOTE — Progress Notes (Signed)
[address]  [place], [place] [address]    [address]  [place], [place] [address]    Phone: [phone]  Fax:      [phone]  E-mail: [email or web address]      HPI  Kelsey Pace is a 56 y.o. female with a chief complaint of right anterior shoulder pain after a fall at home about 2 weeks ago. No LOC.  She did not seek medical attention.   She tried pain management with Tylenol, Advil and Aleve, but there was no relief. She feels the pain is getting worse. She was seen at [facility] 10/08/22 where x-rays were taken.     Pain moving the right arm.  No diabetes.    Hand dominance: right   Date of injury: 9/ 24/23  Aggravating factors: Shoulder motion  Pain scale:   Numbness or tingling:  Neck pain: yes  Immobilization: sling x 1 day helps  Steroid injections for this problem:  Tests or imaging studies: xrays  Occupational or Physical Therapy: no  Prior upper extremity fractures: no  Prior upper extremity surgery: no  Occupation: helps elderly patients.     Past Medical History:   Diagnosis Date   . Bilateral ovarian cysts    . Hypertriglyceridemia    . Obesity    . OSA (obstructive sleep apnea)        No past surgical history on file.    Social History     Occupational History   . Not on file   Tobacco Use   . Smoking status: Never   . Smokeless tobacco: Never   Substance and Sexual Activity   . Alcohol use: Not on file   . Drug use: Not on file   . Sexual activity: Not on file       Current Outpatient Medications   Medication Instructions   . levonorgestrel (Mirena) 20 mcg/24 hours (7 yrs) 52 mg IUD intrauterine, Daily       Allergies   Allergen Reactions   . No Known Allergies        Exam  Ht Readings from Last 1 Encounters:   10/08/22 1.626 m     Wt Readings from Last 1 Encounters:   10/08/22 99.8 kg     BMI Readings from Last 1 Encounters:   10/08/22 37.76 kg/m     Comfortable. Normal mood. Cooperative with exam.   No pain with mildly limited range of motion of the neck.    Both  shoulders are examined.  No edema or deformity the shoulder or arm.  Mildly tender over the right subacromial space not the left.  Not tender either acromioclavicular joint.    She can actively abduct the left arm overhead.  Pain with attempted right arm abduction past about 80 degrees.  Passive abduction to about 100 degrees with moderate pain.    Full active elbow motion and 5/5 strength for flexion extension bilaterally.  Grip is 5/5 bilaterally.  Sensations intact and symmetric to light touch both upper extremities.    Bilateral radial pulses are palpable.    Diagnostic Tests  XR SHOULDER RIGHT 2+ VIEWS  Narrative: Reason for exam (per EHR order): pain, fall    TECHNIQUE: Right shoulder 3 views    COMPARISON: None.      FINDINGS:  There is a small corner fracture at the inferior margin of the glenoid.  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  There is moderate acromioclavicular joint degeneration.  There is no dislocation. Preserved glenohumeral cartilage space. Acromioclavicular joint is intact.  Impression: Small fracture of the inferior margin of the glenoid.  Hill-Sachs deformity    Rosita Fire, MD 10/08/2022 4:44 PM  Right shoulder x-ray images analyzed and interpreted from [facility] system from 10/08/2022.  Congruent glenohumeral joint.  Small calcification at the inferior edge of the glenoid appears to be an avulsion fracture.  It could be acute.  No dislocation.      Assessment & Plan  Rotator cuff strain, right, initial encounter [S46.011A]   1. Rotator cuff strain, right, initial encounter    2. Glenoid fracture of shoulder, right, closed, initial encounter      Right shoulder pain 2 weeks after fall could be due to a rotator cuff strain and/or a small inferior glenoid fracture associated with a near dislocation or subluxation event.  She does not recall actually dislocating her shoulder.    I went over the x-rays with her.  I am hopeful this will get better without surgery.  It may take 3  to 4 months to get completely better.    I showed her how to wear her sling properly.  Work on elbow motion and I showed her how to work on gentle shoulder motion exercises.    I gave her a note to stay out of work until she follows up in 3 to 4 weeks for another exam.    If she does not improve with physical therapy additional imaging could be considered.    Ibuprofen over-the-counter with food if needed for pain.    Signed by:  Luana Shu, MD  [email or web address]   Dictated with voice recognition. Please contact me if you feel there are any errors.

## 2022-10-30 ENCOUNTER — Ambulatory Visit
Admit: 2022-10-30 | Discharge: 2022-10-30 | Attending: Hand Surgery | Primary: Student in an Organized Health Care Education/Training Program

## 2022-10-30 DIAGNOSIS — S46011A Strain of muscle(s) and tendon(s) of the rotator cuff of right shoulder, initial encounter: Secondary | ICD-10-CM

## 2022-10-30 NOTE — Progress Notes (Signed)
[address]  [place], [place] [address]    [address]  [place], [place] [address]    Phone: [phone]  Fax:      [phone]  E-mail: [email or web address]      HPI  Kelsey Pace is a 56 y.o. female with a chief complaint of right shoulder pain. She is 6 weeks out from her injury which was a rotator cuff strain and possible inferior glenoid fracture. She wears a sling most of the time. She take advil and tylenol with help. No PT yet.     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 Bilateral ovarian cysts    . Hypertriglyceridemia    . Obesity    . OSA (obstructive sleep apnea)        History reviewed. No pertinent surgical history.    Social History     Occupational History   . Not on file   Tobacco Use   . Smoking status: Never   . Smokeless tobacco: Never   Substance and Sexual Activity   . Alcohol use: Not on file   . Drug use: Not on file   . Sexual activity: Not on file       Current Outpatient Medications   Medication Instructions   . levonorgestrel (Mirena) 20 mcg/24 hours (7 yrs) 52 mg IUD intrauterine, Daily       Allergies   Allergen Reactions   . No Known Allergies        Exam  Ht Readings from Last 1 Encounters:   10/08/22 1.626 m     Wt Readings from Last 1 Encounters:   10/08/22 99.8 kg     BMI Readings from Last 1 Encounters:   10/08/22 37.76 kg/m     Comfortable. Normal mood. Cooperative with exam.   Both shoulders arms are examined.  No obvious edema or deformity the shoulder or arm.  Mildly tender anteriorly over the right not the left shoulder.  Mild pain but no crepitus with right shoulder motion.    Shoulder Musculoskeletal Exam    Range of Motion    Right      Shoulder active abduction: 80.       Passive abduction: 130.       Active external rotation at side: 60.       Active internal rotation behind the back: buttock.     Left      Shoulder active abduction: 160.       Active external rotation at side: 80.        Internal rotation: T11.     Strength    Right      External rotation: 4+/5.       Internal rotation: 5/5.       Abduction: 5/5.       Biceps: 5/5.       Triceps: 5/5.     Left      External rotation: 5/5.       Internal rotation: 5/5.       Abduction: 5/5.       Biceps: 5/5.       Triceps: 5/5.   Supraspinatus strength is 4+/5 in the right with mild pain and 5/5 on left.    Sensation is intact and symmetric to light touch both upper extremities.  Bilateral radial pulses are palpable.    Diagnostic Tests  XR SHOULDER RIGHT  2+ VIEWS  Narrative: Reason for exam (per EHR order): pain, fall    TECHNIQUE: Right shoulder 3 views    COMPARISON: None.      FINDINGS:  There is a small corner fracture at the inferior margin of the glenoid.  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  There is moderate acromioclavicular joint degeneration.  There is no dislocation. Preserved glenohumeral cartilage space. Acromioclavicular joint is intact.  Impression: Small fracture of the inferior margin of the glenoid.  Hill-Sachs deformity    Rosita Fire, MD 10/08/2022 4:44 PM      Assessment & Plan  Rotator cuff strain, right, initial encounter [S46.011A]   1. Rotator cuff strain, right, initial encounter    2. Displaced fracture of glenoid cavity of scapula, right shoulder, subsequent encounter for fracture with routine healing      Right shoulder pain and stiffness 6 weeks out from her fall may be due to a rotator cuff strain and/or a subluxation injury which may have caused a small inferior glenoid fracture.    I recommended weaning out of her sling.  I showed her how to work on active assisted wall crawl motion exercises.  She was given a note to return to work next week.  Avoid heavy overhead lifting.    I gave her a prescription for physical therapy to work on active and passive shoulder motion in all planes as well as strengthening.  She wants to go somewhere closer to home.    Follow-up in 6 weeks and we will determine if we should continue  with physical therapy or get additional imaging.    Ibuprofen over-the-counter with food if needed for pain.    Signed by:  Luana Shu, MD  [email or web address]   Dictated with voice recognition. Please contact me if you feel there are any errors.

## 2022-12-11 ENCOUNTER — Encounter: Attending: Hand Surgery | Primary: Student in an Organized Health Care Education/Training Program

## 2022-12-18 NOTE — Telephone Encounter (Signed)
Pt would like to schedule covid and flu vax. I offered appts. Pt opted to get vax at [facility] due to scheduling conflicts.

## 2023-04-07 NOTE — Telephone Encounter (Signed)
Patient calling in states has had cough for 2 weeks, notes no chest pain or shortness of breath. Requesting an appointment     Call back  [phone]

## 2023-04-07 NOTE — Telephone Encounter (Signed)
Previous Dr Dia Sitter pt.   Not reestablished with office.   Calling to offer appt with NP for cough.     Called pt.   cough is making chest sore, pt using OTC cough syrups. Not really helping.   Pt is COVID neg.   Pt booked appt for tomorrow.   Unable to get all triage as pt had to take incoming call.   No further actions.   All topics addressed.   Signing note.

## 2023-04-08 ENCOUNTER — Ambulatory Visit: Admit: 2023-04-08 | Discharge: 2023-04-08 | Primary: Student in an Organized Health Care Education/Training Program

## 2023-04-08 DIAGNOSIS — R051 Acute cough: Secondary | ICD-10-CM

## 2023-04-08 MED ORDER — benzonatate (Tessalon) 200 mg capsule
200 | ORAL_CAPSULE | Freq: Three times a day (TID) | ORAL | 0 refills | 10.00000 days | Status: AC | PRN
Start: 2023-04-08 — End: 2023-05-09

## 2023-04-08 MED ORDER — albuterol (ProAir HFA) 90 mcg/actuation inhaler
90 | RESPIRATORY_TRACT | 0 refills | Status: AC | PRN
Start: 2023-04-08 — End: 2024-04-07

## 2023-04-08 NOTE — Assessment & Plan Note (Addendum)
Suspect acute bronchitis  DDx: Asthma, PND (less likely given severity of cough), TB (less likely given no night sweats, wt loss, fatigue), post-infectious cough (less likely given that cough was not preceded by cold sx)  Management: Benzonatate PRN for cough suppression, Albuterol PRN for SOB and chest tightness.  Follow up: RTC if symptoms persist or worsen. ED precautions discussed.

## 2023-04-08 NOTE — Other (Signed)
Patient Education  Table of Contents   How to Use a Metered Dose Inhaler    To view videos and all your education online visit,  https://pe.elsevier.com/FBosCdMq  or scan this QR code with your smartphone.  Access to this content will expire in one year.  How to Use a Metered Dose Inhaler    A metered dose inhaler (MDI) is a handheld device for taking medicine that must be breathed into the lungs (inhaled). The medicine comes in a canister that delivers a spray (puff) of medicine. Each canister holds a certain number of doses.  A spacer (holding chamber) may be used to get more medicine into your lungs.  What are the risks?   If the medicine in the MDI is a steroid, it can cause mouth sores. To prevent this, rinse your mouth after you use the MDI. Gargle and spit out the water. Do not swallow the water.   If you do not use the MDI in the right way, the medicine may not reach your lungs.   The medicine in the MDI may cause side effects. Read the package insert for the medicine to learn more. Ask your health care provider or pharmacist if you have questions.   If you do not have enough strength to push down the canister to make it spray, ask your provider for ways to help.  How to use an MDI without a spacer    1. Remove the cap from the MDI.  If you are using the MDI for the first time, prepare (prime) it for use.   Read the instructions for your inhaler or ask your provider about the number of puffs needed to prime your MDI.   To prime your inhaler, shake it for 5 seconds, turn it away from your face, then send puffs into the air.  Shake the MDI for 5 seconds.  Position the inhaler so the top of the canister faces up.  Put your pointer finger on the top of the canister. Support the bottom of the MDI with your thumb.  Breathe out normally and as fully as you can, away from the MDI.  Either place the MDI between your teeth and close your lips tightly around the mouthpiece or hold it 1?2 inches (2.5?5 cm) away from  your open mouth. Keep your tongue down out of the way. If you are unsure which technique to use, ask your provider.  Press the canister down with your finger to release the medicine. Inhale deeply and slowly through your mouth until your lungs are filled. Do not breathe in through your nose. Inhaling should take 4?6 seconds.  Hold the medicine in your lungs for 5?10 seconds. This helps it get into the small airways of your lungs.  Remove the inhaler from your mouth. Turn your head and breathe out normally.  Wait about 1 minute between puffs. Repeat steps 3?10 until you have taken the number of puffs that your provider told you to.  Put the cap on the MDI.  How to use an MDI with a spacer    1. Remove the cap from the MDI.  If you are using the MDI for the first time, prime it for use.   Read the instructions for your inhaler or ask your provider about the number of puffs needed to prime your MDI.   To prime your inhaler, shake it for 5 seconds, turn it away from your face, then send puffs into the air.  Shake the  MDI for 5 seconds.  Put the open end of the spacer onto the mouthpiece.  Position the inhaler so the top of the canister faces up and the spacer mouthpiece faces you.  Put your pointer finger on the top of the canister. Support the bottom of the MDI and the spacer with your thumb.  Breathe out normally and as fully as you can, away from the spacer.  Place the spacer between your teeth. Close your lips tightly around it. Keep your tongue down out of the way.  Press the canister down with your finger to release the medicine. Inhale deeply and slowly through your mouth until your lungs are filled. Do not breathe in through your nose.  Hold the medicine in your lungs for 5?10 seconds. This helps it get into the small airways of your lungs.  Remove the spacer from your mouth. Turn your head, and breathe out normally.  Wait about 1 minute between puffs. Repeat steps 3?11 until you have taken the number of puffs  that your provider told you to.  Remove the spacer from the inhaler. Put the cap on the MDI.  Follow these instructions at home:  Caring for your MDI   Refill your MDI with medicine before all the preset doses have been used.  ? If your inhaler has a counter, check it to see how full your MDI is. The number you see tells you how many doses are left.  ? If your inhaler does not have a counter, ask your provider when you will need to refill it. Write the refill date on a calendar or on your MDI canister.  ? Keep in mind that you cannot tell when the medicine in an inhaler is empty by shaking it. You may feel or hear something in the canister even when the medicine has been used up.   Store your MDI in a cool, dry place at room temperature.   Follow instructions on the package insert for care and cleaning of your MDI and spacer.  General instructions   Take your inhaled medicine only as told by your provider. Do not use the MDI more than you are told to.   Do not use any products that contain nicotine or tobacco. These products include cigarettes, chewing tobacco, and vaping devices, such as e-cigarettes. If you need help quitting, ask your provider.  Where to find more information   Centers for Disease Control and Prevention (CDC): StoreMirror.com.cy   American Lung Association: lung.org  Contact a health care provider if:   Your symptom do not get better with the MDI.   You have side effects from the medicine.   You are not sure how to use the MDI or your inhaler.   Your inhaler is not working like it should.   You have a cough that will not go away.   You have a very sore mouth or throat and have trouble swallowing.  Get help right away if:   You have severe shortness of breath or trouble breathing.   You have chest tightness or chest pain.   You have an allergic reaction. Symptoms may include an itchy rash, swelling of the face or tongue, or trouble breathing.  These symptoms may be an emergency. Get help right away. Call  911.   Do not wait to see if the symptoms will go away.   Do not drive yourself to the hospital.  This information is not intended to replace advice given to you  by your health care provider. Make sure you discuss any questions you have with your health care provider.  Document Released: 2005-12-16 Document Updated: 2022-10-23 Document Reviewed: 2022-10-23  Elsevier Patient Education ? [place].

## 2023-04-08 NOTE — Progress Notes (Signed)
[facility]  [facility]  [address]  [address] and [address]  [place] [address]  Dept: [phone]  Dept Fax: [phone]     Patient ID: Kelsey Pace is a 57 y.o. female who presents for Cough.    Subjective   Course of illness  Patient developed a cough 2 weeks ago in the absence of cold symptoms.  Chronicity: new problem.  Progression: worsening  Frequency: constant    Symptoms  Characteristics: Productive with yellow-green mucus.  Associated symptoms: Endorses chest tightness, SOB, nasal congestion, sore throat.  Pertinent negatives: Denies ear pain, rhinorrhea, cold sx, fever, acid reflux, night sweats, and unexplained weight loss.  Alarm symptoms: Denies wheezing and chest pain.  Exacerbating factors: Worse at night, talking too much, exercise  Alleviating factors: The patient is unable to identify any alleviating factors.    Treatments tried  Current: Claritin, Delsym, and robitussin with no improvement    PMH  Pertinent medical hx: No hx of asthma, COPD, GERD, seasonal allergies.  Risk factors: Never smoker    Prior work up  [employer] neg yesterday and last Thursday    Review of Systems   Constitutional: Negative for chills, diaphoresis, fever and unexpected weight change.   HENT: Positive for sore throat. Negative for ear pain, postnasal drip and rhinorrhea.    Respiratory: Positive for cough and shortness of breath. Negative for wheezing.    Cardiovascular: Negative for chest pain.   Musculoskeletal: Negative for myalgias.   Skin: Negative for rash.   Neurological: Positive for headaches.       Patient Active Problem List   Diagnosis   . Endometriosis of uterus   . Hypertriglyceridemia   . Class 2 obesity due to excess calories without serious comorbidity with body mass index (BMI) of 38.0 to 38.9 in adult   . Obstructive sleep apnea (adult) (pediatric)   . Diverticula of colon   . Migraine  headache   . Vitamin D deficiency   . Cellulitis of face   . Elevated blood pressure reading in office without diagnosis of hypertension   . Acute cough     Current Outpatient Medications   Medication Instructions   . albuterol (ProAir HFA) 90 mcg/actuation inhaler 2 puffs, inhalation, Every 4 hours PRN   . benzonatate (TESSALON) 200 mg, oral, 3 times daily PRN, Do not crush or chew.   . levonorgestrel (Mirena) 20 mcg/24 hours (7 yrs) 52 mg IUD intrauterine, Daily     Allergies   Allergen Reactions   . No Known Allergies        Objective   Visit Vitals  BP 124/74 (BP Location: Left arm, Patient Position: Sitting, BP Cuff Size: Large adult)   Pulse 70   Temp 36.8 C (98.3 F) (Oral)   Wt 100.7 kg   BMI 38.11 kg/m   OB Status Postmenopausal   BSA 2.13 m       Physical Exam  Constitutional:       General: She is not in acute distress.     Appearance: Normal appearance.   HENT:      Mouth/Throat:      Pharynx: Oropharynx is clear.      Tonsils: 1+ on the right. 1+ on the left.   Cardiovascular:      Rate and Rhythm: Normal rate and regular rhythm.      Heart sounds: Normal heart sounds.  Pulmonary:      Effort: Pulmonary effort is normal. No respiratory distress.      Breath sounds: Normal breath sounds.   Neurological:      General: No focal deficit present.      Mental Status: She is alert and oriented to person, place, and time. Mental status is at baseline.   Psychiatric:         Mood and Affect: Mood normal.         Behavior: Behavior normal.         Thought Content: Thought content normal.         Judgment: Judgment normal.         Assessment/Plan   Kelsey Pace was seen today for cough.  Acute cough  Assessment & Plan:  Suspect acute bronchitis  DDx: Asthma, PND (less likely given severity of cough), TB (less likely given no night sweats, wt loss, fatigue), post-infectious cough (less likely given that cough was not preceded by cold sx)  Management: Benzonatate PRN for cough suppression, Albuterol PRN for SOB and  chest tightness.  Follow up: RTC if symptoms persist or worsen. ED precautions discussed.  Orders:  -     albuterol (ProAir HFA) 90 mcg/actuation inhaler; Inhale 2 puffs every 4 (four) hours if needed for wheezing or shortness of breath.  -     benzonatate (Tessalon) 200 mg capsule; Take 1 capsule (200 mg) by mouth if needed in the morning, at noon, and at bedtime for cough. Do not crush or chew.  Depression screening      Patient Health Questionnaire-2 Score: 0   Interpretation: Negative screening.      Follow-up & Interventions: Maintain annual screening - No additional Follow-up required

## 2023-04-08 NOTE — Patient Instructions (Signed)
For your cough:  Do not take these medications with combination cold medicines.  Warm, humidified air (like the steam from a hot shower) will help loosen the mucus.  Call the office or seek emergency care if you develop:  Difficulty breathing  Wheezing  New onset of fever  Or if you have no improvement of symptoms

## 2023-05-09 ENCOUNTER — Ambulatory Visit
Admit: 2023-05-09 | Discharge: 2023-05-09 | Attending: Student in an Organized Health Care Education/Training Program | Primary: Student in an Organized Health Care Education/Training Program

## 2023-05-09 DIAGNOSIS — Z0000 Encounter for general adult medical examination without abnormal findings: Secondary | ICD-10-CM

## 2023-05-09 MED ORDER — terbinafine (LamISIL) 250 mg tablet
250 | ORAL_TABLET | Freq: Every day | ORAL | 0 refills | 20.00000 days | Status: DC
Start: 2023-05-09 — End: 2023-06-09

## 2023-05-09 NOTE — Telephone Encounter (Signed)
Last [facility]  02/2020  Repeat 10 yrs  Good Miralax   Normal

## 2023-05-09 NOTE — Progress Notes (Signed)
OUTPATIENT PROGRESS NOTE  Subjective      Patient ID: Kelsey Pace is a 57 y.o. female.    HPI  Kelsey Pace presents for annual exam.     IUD placed 2014, wonders if it is OK to remove. She did have some spotting about 7 months ago.     She worries about colon cancer. Friend recently diagnosed, although she was UTD with her colonoscopies and had actually had one relatively recently prior to her diagnosis. She had last 2021, which showed only diverticulosis. She does endorse constipation, however she has recently noticed thin caliper stools. Denies bloody or black stools, weight loss.    Also with problem of toe nails. Was given a cream, which wasn't helpful. Thickened and yellow. Has nail polish on today.    HCM:  Pap: NILM HPV negative 2017 - we will plan to complete at her IUD removal, which she will schedule at checkout.  Mammogram: 04/2022, scheduled next week  DEXA: start age 43  Colonoscopy: 2021, diverticulosis, repeat 10 yrs  Lung Ca Screening: n/a  AAA: n/a  Vaccines:  - pneumonia: n/a  - shingles: second dose today  - flu: defer to next season  - TDAP: UTD 2016  - COVID: defer to next season  STD screening:    Family Planning: IUD Mirena, overdue for removal. Likely postmenopausal.    Diet: regular well balanced  Exercise:     Dental home? yes  Ophtho home? yes      The following portions of the patient's history were reviewed and updated as appropriate: allergies, current medications, past medical history, family history, social history, and problem list .    Current Outpatient Medications on File Prior to Visit   Medication Sig Dispense Refill   . albuterol (ProAir HFA) 90 mcg/actuation inhaler Inhale 2 puffs every 4 (four) hours if needed for wheezing or shortness of breath. 8.5 g 0   . benzonatate (Tessalon) 200 mg capsule Take 1 capsule (200 mg) by mouth if needed in the morning, at noon, and at bedtime for cough. Do not crush or chew. 42 capsule 0   . levonorgestrel (Mirena) 20 mcg/24 hours (7  yrs) 52 mg IUD by intrauterine route in the morning.       No current facility-administered medications on file prior to visit.       Review of Systems   Constitutional: Negative for chills, fever and unexpected weight change.   HENT: Negative for hearing loss and tinnitus.    Eyes: Negative for visual disturbance.   Respiratory: Negative for cough and shortness of breath.    Cardiovascular: Negative for chest pain, palpitations and leg swelling.   Gastrointestinal: Negative for abdominal pain.        Thin caliber stools   Genitourinary:        No change in urinary habits   Musculoskeletal: Negative for myalgias.   Neurological: Negative for dizziness.         Objective     Vitals:    05/09/23 0706   BP: 122/64   BP Location: Left arm   Patient Position: Sitting   BP Cuff Size: Adult   Pulse: 80   Temp: 36.8 C (98.2 F)   TempSrc: Oral   SpO2: 96%   Weight: 101.7 kg   Height: 1.626 m     Body mass index is 38.46 kg/m.  Wt Readings from Last 3 Encounters:   05/09/23 101.7 kg   04/08/23 100.7 kg  10/08/22 99.8 kg       Physical Exam  Constitutional:       General: She is not in acute distress.     Appearance: Normal appearance.   HENT:      Head: Normocephalic and atraumatic.      Right Ear: Tympanic membrane and ear canal normal.      Left Ear: Tympanic membrane and ear canal normal.      Nose: Nose normal. No congestion.      Mouth/Throat:      Mouth: Mucous membranes are moist.      Pharynx: No oropharyngeal exudate or posterior oropharyngeal erythema.   Eyes:      General: No scleral icterus.     Extraocular Movements: Extraocular movements intact.      Pupils: Pupils are equal, round, and reactive to light.   Neck:      Thyroid: No thyromegaly or thyroid tenderness.   Cardiovascular:      Rate and Rhythm: Normal rate and regular rhythm.      Heart sounds: No murmur heard.     No friction rub. No gallop.   Pulmonary:      Effort: Pulmonary effort is normal.      Breath sounds: Normal breath sounds. No wheezing,  rhonchi or rales.   Abdominal:      General: Bowel sounds are normal. There is no distension.      Palpations: Abdomen is soft.      Tenderness: There is no abdominal tenderness.   Musculoskeletal:         General: Normal range of motion.      Cervical back: Normal range of motion and neck supple.      Right lower leg: No edema.      Left lower leg: No edema.   Lymphadenopathy:      Cervical: No cervical adenopathy.   Skin:     General: Skin is warm and dry.   Neurological:      General: No focal deficit present.      Mental Status: She is alert and oriented to person, place, and time. Mental status is at baseline.   Psychiatric:         Mood and Affect: Mood normal.         Behavior: Behavior normal.         Thought Content: Thought content normal.         Judgment: Judgment normal.         Assessment/Plan   Problem List Items Addressed This Visit     Hypertriglyceridemia    Current Assessment & Plan     Lipids with labs today         Vitamin D deficiency    Current Assessment & Plan     Included with labs today         Change in stool caliber    Current Assessment & Plan     Colonoscopy ordered given her report of small caliber stools. I did review dietary modifications for constipation, as well as Miralax prn for mild-moderate sx. If her sx resolve, OK to cancel colonoscopy.          Relevant Orders    Colonoscopy    Onychomycosis    Current Assessment & Plan     Terbinafine sent. F/u 1 month will repeat LFTs and send remainder of Rx to complete total 3 month supply. I was unable to view complete nails today 2/2 polish, however they  do appear thickened and her description is c/w dx of onychomycosis.         Relevant Medications    terbinafine (LamISIL) 250 mg tablet    Other Relevant Orders    Hepatic function panel    Encounter for routine checking of intrauterine contraceptive device (IUD)    Current Assessment & Plan     It looks like she is overdue for removal. Offered to remove today, however she worries  about pregnancy. She is aware that if it has been >7 yrs since placement, it may no longer be effective for pregnancy prevention. It is also very likely given her age that she has gone through menopause. Will include FSH with labs today. F/u 1 month for removal and lab review. Will plan for pap that visit as well.         Relevant Orders    Follicle stimulating hormone   Other Visit Diagnoses     Annual physical exam    -  Primary    HCM per HPI above. Labs per orders. F/u 1 month for labs review and IUD removal and pap. healthy lifestyle reviewed. can consider referral to Dr. Ladona Ridgel for weight management if desired.    Relevant Orders    Lipid panel    CBC and differential    TSH with reflex    Comprehensive metabolic panel    Hemoglobin A1c    Vitamin D 25 hydroxy          Patient Health Questionnaire-2 Score: 0 Interpretation: Negative screening.      Follow-up & Interventions: Maintain annual screening - No additional Follow-up required      Portions of this note were written using dictation/voice recognition technology. Please excuse any typographical or grammatical errors that may have been missed as a result.     Signature:  Nelson Chimes, DO  Family Medicine  05/09/2023  10:42 AM

## 2023-05-09 NOTE — Assessment & Plan Note (Signed)
Included with labs today.

## 2023-05-09 NOTE — Assessment & Plan Note (Signed)
It looks like she is overdue for removal. Offered to remove today, however she worries about pregnancy. She is aware that if it has been >7 yrs since placement, it may no longer be effective for pregnancy prevention. It is also very likely given her age that she has gone through menopause. Will include FSH with labs today. F/u 1 month for removal and lab review. Will plan for pap that visit as well.

## 2023-05-09 NOTE — Assessment & Plan Note (Signed)
Lipids with labs today. 

## 2023-05-09 NOTE — Assessment & Plan Note (Signed)
Terbinafine sent. F/u 1 month will repeat LFTs and send remainder of Rx to complete total 3 month supply. I was unable to view complete nails today 2/2 polish, however they do appear thickened and her description is c/w dx of onychomycosis.

## 2023-05-09 NOTE — Assessment & Plan Note (Signed)
Colonoscopy ordered given her report of small caliber stools. I did review dietary modifications for constipation, as well as Miralax prn for mild-moderate sx. If her sx resolve, OK to cancel colonoscopy.

## 2023-05-10 LAB — FOLLICLE STIMULATING HORMONE: FSH: 32.5 m[IU]/mL

## 2023-05-10 LAB — COMPREHENSIVE METABOLIC PANEL
A/G Ratio: 1.9 (ref 1.2–2.2)
ALT: 20 IU/L (ref 0–32)
AST: 23 IU/L (ref 0–40)
Albumin: 4.2 g/dL (ref 3.8–4.9)
Alk Phosphatase: 108 IU/L (ref 44–121)
Anion Gap: 15 mmol/L (ref 10.0–18.0)
BUN/Creat Ratio: 19 (ref 9–23)
BUN: 15 mg/dL (ref 6–24)
Bili Total: 0.3 mg/dL (ref 0.0–1.2)
Calcium: 9.3 mg/dL (ref 8.7–10.2)
Carbon Dioxide: 22 mmol/L (ref 20–29)
Chloride: 104 mmol/L (ref 96–106)
Creat: 0.79 mg/dL (ref 0.57–1.00)
Globulin Total: 2.2 g/dL (ref 1.5–4.5)
Glucose: 95 mg/dL (ref 70–99)
Potassium: 4.5 mmol/L (ref 3.5–5.2)
Protein Total: 6.4 g/dL (ref 6.0–8.5)
Sodium: 141 mmol/L (ref 134–144)
eGFR: 88 mL/min/{1.73_m2} (ref >59)

## 2023-05-10 LAB — CBC W/DIFF
Baso Abs: 0.1 10*3/uL (ref 0.0–0.2)
Basos: 1 %
Eos Abs: 0.8 10*3/uL — ABNORMAL HIGH (ref 0.0–0.4)
Eos: 11 %
Hct: 42.5 % (ref 34.0–46.6)
Hgb: 13.8 g/dL (ref 11.1–15.9)
Immature Grans Abs: 0 10*3/uL (ref 0.0–0.1)
Immature Granulocytes: 0 %
Lymphs Abs: 1.7 10*3/uL (ref 0.7–3.1)
Lymphs: 26 %
MCH: 30.3 pg (ref 26.6–33.0)
MCHC: 32.5 g/dL (ref 31.5–35.7)
MCV: 93 fL (ref 79–97)
Monocytes: 7 %
MonocytesAbs: 0.5 10*3/uL (ref 0.1–0.9)
Neutrophils Abs: 3.7 10*3/uL (ref 1.4–7.0)
Neutrophils: 55 %
Platelets: 238 10*3/uL (ref 150–450)
RBC: 4.55 x10E6/uL (ref 3.77–5.28)
RDW: 12.8 % (ref 11.7–15.4)
WBC: 6.8 10*3/uL (ref 3.4–10.8)

## 2023-05-10 LAB — TSH WITH REFLEX: TSH: 3.5 u[IU]/mL (ref 0.450–4.500)

## 2023-05-10 LAB — LIPID PANEL
Cholesterol: 189 mg/dL (ref 100–199)
HDL Cholesterol: 40 mg/dL (ref >39)
LDLc Calc (NIH): 113 mg/dL — ABNORMAL HIGH (ref 0–99)
Non-HDL Chol: 149 mg/dL — ABNORMAL HIGH (ref 0–129)
Triglycerides: 205 mg/dL — ABNORMAL HIGH (ref 0–149)
VLDLc Calc: 36 mg/dL (ref 5–40)

## 2023-05-10 LAB — HEMOGLOBIN A1C: HgbA1C: 5.9 % — ABNORMAL HIGH (ref 4.8–5.6)

## 2023-05-10 LAB — VITAMIN D 25 HYDROXY: Vitamin D, 25-Hydroxy: 14.4 ng/mL — ABNORMAL LOW (ref 30.0–100.0)

## 2023-05-12 NOTE — Telephone Encounter (Signed)
Dr Harley Hallmark please advise.    Procedure: Colonoscopy  Indication: Chge in stool caliber    Date: Friday 07/25/2023  Time: 8/9  Doctor: Harley Hallmark       Pacemaker/AICD:  No   Blood thinner med:  No   Diabetic meds:  No   GLP1-agonist for diabetes   OR weight loss:  No    Dialysis: No    BMI >45:  No    Less than 3 BM/week:  No  Need for transport:   No      Pharmacy: [facility] [address] [place]     -------------  LAST PROCEDURE  Provider: Claudie Leach  Finding:  Normal  Year:   02/2020       LAST PROCEDURE PREP USED: Miralax  LAST PROCEDURE PREP QUALITY: Good

## 2023-05-12 NOTE — Telephone Encounter (Signed)
LVM for patient to call back to schedule Colonoscopy.

## 2023-05-16 ENCOUNTER — Inpatient Hospital Stay: Admit: 2023-05-16 | Primary: Student in an Organized Health Care Education/Training Program

## 2023-05-16 DIAGNOSIS — Z0000 Encounter for general adult medical examination without abnormal findings: Secondary | ICD-10-CM

## 2023-05-16 DIAGNOSIS — Z1231 Encounter for screening mammogram for malignant neoplasm of breast: Secondary | ICD-10-CM

## 2023-06-03 NOTE — Telephone Encounter (Signed)
Chart reviewed  Ok for colo w/ anes  Miralax prep     Cc JB

## 2023-06-03 NOTE — Telephone Encounter (Signed)
Will send Miralax prep instructions through [employer].        Prep for procedure:  [place] ColoPrep: Miralax

## 2023-06-03 NOTE — Telephone Encounter (Signed)
Will send Miralax prep instructions through [employer].        Prep for procedure:  [place] ColoPrep: Miralax

## 2023-06-09 ENCOUNTER — Ambulatory Visit
Admit: 2023-06-09 | Discharge: 2023-06-09 | Attending: Student in an Organized Health Care Education/Training Program | Primary: Student in an Organized Health Care Education/Training Program

## 2023-06-09 DIAGNOSIS — E559 Vitamin D deficiency, unspecified: Secondary | ICD-10-CM

## 2023-06-09 MED ORDER — ergocalciferol, vitamin D2, (Vitamin D-2) 1.25 MG (50000 UT) capsule
1.25 | ORAL_CAPSULE | ORAL | 0 refills | 28.00000 days | Status: AC
Start: 2023-06-09 — End: 2023-09-01

## 2023-06-09 MED ORDER — terbinafine (LamISIL) 250 mg tablet
250 | ORAL_TABLET | Freq: Every day | ORAL | 0 refills | 20.00000 days | Status: AC
Start: 2023-06-09 — End: ?

## 2023-06-09 NOTE — Progress Notes (Signed)
OUTPATIENT PROGRESS NOTE  Subjective      Patient ID: Kelsey Pace is a 57 y.o. female.    HPI  Kelsey Pace presents for IUD removal and onychomycosis    Yellowing and thickening of nails    Due for iud removal. No menses    The following portions of the patient's history were reviewed and updated as appropriate: allergies, current medications, past medical history, family history, social history, and problem list .    Current Outpatient Medications on File Prior to Visit   Medication Sig Dispense Refill    albuterol (ProAir HFA) 90 mcg/actuation inhaler Inhale 2 puffs every 4 (four) hours if needed for wheezing or shortness of breath. (Patient not taking: Reported on 07/25/2023) 8.5 g 0    levonorgestrel (Mirena) 20 mcg/24 hours (7 yrs) 52 mg IUD by intrauterine route in the morning.       No current facility-administered medications on file prior to visit.       Review of Systems  ROS per HPI above    Objective     Vitals:    06/09/23 1428   BP: 120/78   BP Location: Left arm   Patient Position: Sitting   BP Cuff Size: Adult   Pulse: 90   Temp: 36.9 C (98.5 F)   TempSrc: Oral   SpO2: 97%   Weight: 100.2 kg   Height: 1.626 m     Body mass index is 37.9 kg/m.  Wt Readings from Last 3 Encounters:   07/25/23 99.8 kg   06/09/23 100.2 kg   05/09/23 101.7 kg       Physical Exam  Constitutional:       Appearance: Normal appearance.   HENT:      Head: Normocephalic and atraumatic.      Mouth/Throat:      Mouth: Mucous membranes are moist.   Eyes:      Extraocular Movements: Extraocular movements intact.      Pupils: Pupils are equal, round, and reactive to light.   Cardiovascular:      Rate and Rhythm: Normal rate.   Pulmonary:      Effort: Pulmonary effort is normal. No respiratory distress.   Musculoskeletal:         General: Normal range of motion.      Cervical back: Normal range of motion and neck supple.   Neurological:      General: No focal deficit present.      Mental Status: She is alert. Mental status is  at baseline.   Psychiatric:         Mood and Affect: Mood normal.         Behavior: Behavior normal.         Thought Content: Thought content normal.         Judgment: Judgment normal.         IUD Removal    Date/Time: 08/10/2023 6:28 PM    Performed by: Nelson Chimes, DO  Authorized by: Nelson Chimes, DO    Consent:     Consent obtained:  Written    Consent given by:  Patient    Procedure risks and benefits discussed: yes      Patient questions answered: yes      Patient agrees, verbalizes understanding, and wants to proceed: yes    Comments:      IUD strings not visualized. Cervix prepped with iodine. Attempted to retrieve strings with pap cytobrush, unsuccessful. Speculum was removed without  removal of IUD. Patient tolerated well. Return precautions reviewed and all questions answered.      Assessment/Plan   Problem List Items Addressed This Visit       Vitamin D deficiency - Primary    Relevant Medications    ergocalciferol, vitamin D2, (Vitamin D-2) 1.25 MG (50000 UT) capsule    Onychomycosis    Relevant Medications    terbinafine (LamISIL) 250 mg tablet    Other Relevant Orders    Hepatic function panel (Completed)     Other Visit Diagnoses       Attempted IUD removal, unsuccessful        [place] ordered and OB referral provided.    Relevant Orders    Ambulatory referral to Obstetrics / Gynecology    IUD Removal                           Generalized Anxiety Disorder screening - GAD-7 Score: 0    Generalized Anxiety Disorder screening - GAD-7 Score: 0  Interpretation: Negative screening.  Follow up & Intervention: Maintain annual screening - No additional Follow-up required         Portions of this note were written using dictation/voice recognition technology. Please excuse any typographical or grammatical errors that may have been missed as a result.     Signature:  Nelson Chimes, DO  Family Medicine  08/10/2023  6:35 PM

## 2023-06-10 LAB — HEPATIC FUNCTION PANEL
ALT: 25 IU/L (ref 0–32)
AST: 24 IU/L (ref 0–40)
Albumin: 4.6 g/dL (ref 3.8–4.9)
Alk Phosphatase: 110 IU/L (ref 44–121)
Bili Total: 0.5 mg/dL (ref 0.0–1.2)
Bilirubin, Direct: 0.14 mg/dL (ref 0.00–0.40)
Protein Total: 6.8 g/dL (ref 6.0–8.5)

## 2023-07-14 NOTE — Telephone Encounter (Signed)
LVM to confirm Colonoscopy for 07/25/2023 [facility], request pt to call back.  ???Prep-

## 2023-07-14 NOTE — Telephone Encounter (Signed)
Pt returned call and confirmed procedure date/time/ ride and has prep   Pt does not take any diabetic/blood thinner or weight loss injections

## 2023-07-25 ENCOUNTER — Inpatient Hospital Stay: Admit: 2023-07-25 | Discharge: 2023-07-25 | Primary: Student in an Organized Health Care Education/Training Program

## 2023-07-25 DIAGNOSIS — R195 Other fecal abnormalities: Secondary | ICD-10-CM

## 2023-07-25 DIAGNOSIS — D120 Benign neoplasm of cecum: Secondary | ICD-10-CM

## 2023-07-25 MED ORDER — sodium chloride 0.9 % flush 10 mL
Freq: Three times a day (TID) | INTRAMUSCULAR | Status: DC | PRN
Start: 2023-07-25 — End: 2023-07-26

## 2023-07-25 MED ORDER — sodium chloride 0.9 % infusion
0.9 | INTRAVENOUS | Status: DC | PRN
Start: 2023-07-25 — End: 2023-07-25
  Administered 2023-07-25: 12:00:00 via INTRAVENOUS

## 2023-07-25 MED ORDER — propofol (Diprivan) injection
10 | INTRAVENOUS | Status: DC | PRN
Start: 2023-07-25 — End: 2023-07-25
  Administered 2023-07-25: 13:00:00 100 via INTRAVENOUS
  Administered 2023-07-25: 13:00:00 50 via INTRAVENOUS
  Administered 2023-07-25: 13:00:00 150 via INTRAVENOUS

## 2023-07-25 NOTE — Other (Signed)
Patient Education  Table of Contents   Colon Polyps   Diverticulosis   Hemorrhoids    To view videos and all your education online visit,  [email or web address]  or scan this QR code with your smartphone.  Access to this content will expire in one year.  Colon Polyps    Colon polyps are tissue growths inside the colon, which is part of the large intestine. They are one of the types of polyps that can grow in the body. A polyp may be a round bump or a mushroom-shaped growth. You could have one polyp or more than one.  Most colon polyps are noncancerous (benign). However, some colon polyps can become cancerous over time. Finding and removing the polyps early can help prevent this.  What are the causes?  The exact cause of colon polyps is not known.  What increases the risk?  The following factors may make you more likely to develop this condition:   Having a family history of colorectal cancer or colon polyps.   Being older than 57 years of age.   Being younger than 57 years of age and having a significant family history of colorectal cancer or colon polyps or a genetic condition that puts you at higher risk of getting colon polyps.   Having inflammatory bowel disease, such as ulcerative colitis or Crohn's disease.   Having certain conditions passed from parent to child (hereditary conditions), such as:  ? Familial adenomatous polyposis (FAP).  ? Lynch syndrome.  ? Turcot syndrome.  ? Peutz?Jeghers syndrome.  ? MUTYH-associated polyposis (MAP).   Being overweight.   Certain lifestyle factors. These include smoking cigarettes, drinking too much alcohol, not getting enough exercise, and eating a diet that is high in fat and red meat and low in fiber.   Having had childhood cancer that was treated with radiation of the abdomen.  What are the signs or symptoms?  Many times, there are no symptoms.  If you have symptoms, they may include:   Blood coming from the rectum during a bowel movement.   Blood in the  stool (feces). The blood may be bright red or very dark in color.   Pain in the abdomen.   A change in bowel habits, such as constipation or diarrhea.  How is this diagnosed?  This condition is diagnosed with a colonoscopy. This is a procedure in which a lighted, flexible scope is inserted into the opening between the buttocks (anus) and then passed into the colon to examine the area. Polyps are sometimes found when a colonoscopy is done as part of routine cancer screening tests.  How is this treated?  This condition is treated by removing any polyps that are found. Most polyps can be removed during a colonoscopy. Those polyps will then be tested for cancer. Additional treatment may be needed depending on the results of testing.  Follow these instructions at home:  Eating and drinking     Eat foods that are high in fiber, such as fruits, vegetables, and whole grains.   Eat foods that are high in calcium and vitamin D, such as milk, cheese, yogurt, eggs, liver, fish, and broccoli.   Limit foods that are high in fat, such as fried foods and desserts.   Limit the amount of red meat, precooked or cured meat, or other processed meat that you eat, such as hot dogs, sausages, bacon, or meat loaves.   Limit sugary drinks.  Lifestyle   Maintain a healthy weight, or lose  weight if recommended by your health care provider.   Exercise every day or as told by your health care provider.   Do not use any products that contain nicotine or tobacco, such as cigarettes, e-cigarettes, and chewing tobacco. If you need help quitting, ask your health care provider.   Do not drink alcohol if:  ? Your health care provider tells you not to drink.   ? You are pregnant, may be pregnant, or are planning to become pregnant.   If you drink alcohol:  ? Limit how much you use to:  ? 0?1 drink a day for women.   ? 0?2 drinks a day for men.  ? Know how much alcohol is in your drink. In the [place] one drink equals one 12 oz bottle of beer (355 mL), one  5 oz glass of wine (148 mL), or one 1? oz glass of hard liquor (44 mL).  General instructions   Take over-the-counter and prescription medicines only as told by your health care provider.   Keep all follow-up visits. This is important. This includes having regularly scheduled colonoscopies. Talk to your health care provider about when you need a colonoscopy.  Contact a health care provider if:   You have new or worsening bleeding during a bowel movement.   You have new or increased blood in your stool.   You have a change in bowel habits.   You lose weight for no known reason.  Summary   Colon polyps are tissue growths inside the colon, which is part of the large intestine. They are one type of polyp that can grow in the body.   Most colon polyps are noncancerous (benign), but some can become cancerous over time.   This condition is diagnosed with a colonoscopy.   This condition is treated by removing any polyps that are found. Most polyps can be removed during a colonoscopy.  This information is not intended to replace advice given to you by your health care provider. Make sure you discuss any questions you have with your health care provider.  Document Released: 2004-09-11 Document Updated: 2020-04-05 Document Reviewed: 2020-04-05  Elsevier Patient Education ? 2024 Elsevier Inc[employer]  Diverticulosis    Diverticulosis is when small pouches called diverticula form in the wall of the colon. The colon is where water is absorbed. It is also where poop (stool) is formed. The pouches form when the inside layer of the colon pushes through weak spots in the outer layers of the colon. You may have a few pouches or many of them.  In most cases, the pouches do not cause problems. If they become inflamed or infected, you may have a condition called diverticulitis.  What are the causes?  The cause of this condition is not known.  What increases the risk?  You are more likely to get this condition if:   You are older than 57 years of  age.   You do not eat enough fiber or you get constipated a lot.   You are overweight.   You do not get enough exercise.   You smoke.   You take over-the-counter pain medicines.   You have a family history of the condition.  What are the signs or symptoms?  In most people, there are no symptoms. If you do have symptoms, they may include:   Bloating.   Stomach cramps.   Constipation or diarrhea.   Pain in the lower left side of your abdomen.  How is this  diagnosed?  This condition is often diagnosed during an exam for other colon problems. It may be diagnosed when you have:   A colonoscopy. This is when a tube with a camera on the end is used to look at your colon.   A barium enema. This is an X-ray exam that uses dye to look at your colon.   A CT scan.  How is this treated?    You may not need treatment. Your health care provider will tell you what you can do at home to help prevent problems. You may need treatment if you have symptoms or if you have had diverticulitis before. You may be told to:   Eat a high-fiber diet.   Take medicine to relax your colon.   Lose weight.  Follow these instructions at home:  Medicines   Take over-the-counter and prescription medicines only as told by your provider.   If told, take a fiber supplement or probiotic.  Managing constipation  Your condition may cause constipation. To prevent or treat constipation, you may need to:   Drink enough fluid to keep your pee (urine) pale yellow.   Take over-the-counter or prescription medicines.   Eat foods that are high in fiber, such as beans, whole grains, and fresh fruits and vegetables.   Limit foods that are high in fat and processed sugars, such as fried or sweet foods.  Try not to strain when you poop.  Contact a health care provider if:   Your symptoms get worse all of a sudden.   You have pain in your abdomen that gets worse.   You have bloating or stomach cramps.   You continue to have frequent constipation.   You have a fever or  chills.   You vomit.   Your poop is bloody, black, or tarry.  This information is not intended to replace advice given to you by your health care provider. Make sure you discuss any questions you have with your health care provider.  Document Released: 2004-09-12 Document Updated: 2022-09-12 Document Reviewed: 2022-09-12  Elsevier Patient Education ? 2024 Elsevier Inc.  Hemorrhoids    Hemorrhoids are swollen veins in and around the rectum or the opening of the butt (anus). There are two types of hemorrhoids:   Internal. These occur in the veins just inside the rectum. They may poke through to the outside and become irritated and painful.   External. These occur in the veins outside the anus. They can be felt as a painful swelling or hard lump near the anus.  Most hemorrhoids do not cause severe problems. Often, they can be treated at home with diet and lifestyle changes. If home treatments do not help, you may need a procedure to shrink or remove the hemorrhoids.  What are the causes?  Hemorrhoids are caused by pressure near the anus. This pressure may be caused by:   Constipation or diarrhea.   Straining to poop.   Pregnancy.   Obesity.   Sitting or riding a bike for a long time.   Heavy lifting or other things that cause you to strain.   Anal sex.  What are the signs or symptoms?  Symptoms of this condition include:   Pain.   Anal itching or irritation.   Bleeding from the rectum.   Leakage of poop (stool).   Swelling of the anus.   One or more lumps around the anus.  How is this diagnosed?  Hemorrhoids can often be diagnosed through a visual  exam. Other exams or tests may also be done, such as:   A digital rectal exam. This is when your health care provider feels inside your rectum with a gloved finger.   Anoscope. This is an exam of the anus using a small tube.   A blood test, if you have lost a lot of blood.   A sigmoidoscopy or colonoscopy. These are tests to look inside the colon using a tube with a camera on  the end.  How is this treated?  In most cases, hemorrhoids can be treated at home with diet and lifestyle changes. If these changes do not help, you may need to have a procedure done. These procedures can make the hemorrhoids smaller or fully remove them. Common procedures include:   Rubber band ligation. Rubber bands are placed at the base of the hemorrhoids to cut off their blood supply.   Sclerotherapy. Medicine is put into the hemorrhoids to shrink them.   Infrared coagulation. A type of light energy is used to get rid of the hemorrhoids.   Hemorrhoidectomy surgery. The hemorrhoids are removed during surgery. Then, the veins that supply them are tied off.   Stapled hemorrhoidopexy surgery. The base of the hemorrhoid is stapled to the wall of the rectum.  Follow these instructions at home:  Medicines   Take over-the-counter and prescription medicines only as told by your provider.   Use medicated creams or medicines that are put in the rectum (suppositories) as told by your provider.  Eating and drinking     Eat foods that are high in fiber, such as beans, whole grains, and fresh fruits and vegetables.   Ask your provider about taking products that have fiber added to them (fiber supplements).   Reduce the amount of fat in your diet. You can do this by eating low-fat dairy products, eating less red meat, and avoiding processed foods.   Drink enough fluid to keep your pee (urine) pale yellow.  Managing pain and swelling     Take warm sitz baths for 20 minutes, 3?4 times a day. This can help ease pain and discomfort. You may do this in a bathtub or you can use a portable sitz bath that fits over the toilet.   If told, put ice on the affected area. It may help to use ice packs between sitz baths.  ? Put ice in a plastic bag.  ? Place a towel between your skin and the bag.  ? Leave the ice on for 20 minutes, 2?3 times a day.   If your skin turns bright red, remove the ice right away to prevent skin damage. The risk  of damage is higher if you cannot feel pain, heat, or cold.  General instructions   Exercise. Ask your provider how much and what kind of exercise is best for you. In general, you should do moderate exercise for at least 30 minutes on most days of the week (150 minutes each week). You may want to try walking, biking, or yoga.   Go to the bathroom when you have the urge to poop. Do not wait.   Avoid straining to poop.   Keep the anus dry and clean. Use wet toilet paper or moist towelettes after you poop.   Do not sit on the toilet for a long time. This can increase blood pooling and pain.  Where to find more information   [employer] and Digestive [employer] Kidney Diseases: [email or web address]  Contact a  health care provider if:   You have more pain and swelling that do not get better with treatment.   You have trouble pooping or you are not able to poop.   You have pain or inflammation outside the area of the hemorrhoids.  Get help right away if:   You are bleeding from your rectum and you cannot get it to stop.  This information is not intended to replace advice given to you by your health care provider. Make sure you discuss any questions you have with your health care provider.  Document Released: 2000-12-13 Document Updated: 2022-08-28 Document Reviewed: 2022-08-28  Elsevier Patient Education ? 2024 Elsevier Inc.

## 2023-07-25 NOTE — H&P (Signed)
[facility] Short [facility]      MRN: [id number]  Patient Name: Kelsey Pace    DOB:  [birth year]  Gender: female    AGE:  57 y.o.  Encounter Date: 07/25/2023      Primary Care Provider: Nelson Chimes, DO    Scheduled Procedure(s)  [x]  Colonoscopy   []  EGD   []  Push Enteroscopy   []  Pouchoscopy   []  Flexible sigmoidoscopy   []  Ileoscopy   []  Other:      HPI: 57 year old woman, here for evaluation for change in stool caliber and long standing constipation.    Endoscopic History: Last colonoscopy 5-6 y ago: reuslts unknown    FHx of colon cancer: no    Use of AC/AP: no    Indication: change in stool caliber and long standing constipation.    Past Medical History  Past Medical History:   Diagnosis Date    Bilateral ovarian cysts     Diverticula of colon     GERD (gastroesophageal reflux disease)     History of migraine headaches     Hypertriglyceridemia     Obesity     OSA (obstructive sleep apnea)     Vitamin D deficiency      Past Surgical History  Past Surgical History:   Procedure Laterality Date    COLONOSCOPY       Family History  Family History   Problem Relation Name Age of Onset    Hypertension Mother      Arrhythmia Mother          pacemaker    Other (terrorist victim) Father  2        [place]    Asthma Sister      Asthma Brother      Hypertension Brother      Asthma Maternal Grandmother      Breast cancer Neg Hx       Allergies  No known allergies  Current Medications  Current Outpatient Medications   Medication Instructions    albuterol (ProAir HFA) 90 mcg/actuation inhaler 2 puffs, inhalation, Every 4 hours PRN    ergocalciferol (vitamin D2) (VITAMIN D-2) 50,000 Units, oral, Weekly    levonorgestrel (Mirena) 20 mcg/24 hours (7 yrs) 52 mg IUD intrauterine, Daily    terbinafine (LAMISIL) 250 mg, oral, Daily       Physical Exam:   GEN: In no acute distress, alert and oriented     EYES: No conjunctival pallor, no scleral icterus     CV: No abnormalities noted. No LE  edema     PULM: No abnormalities noted; No respiratory distress     ABD: No abnormalities noted. Soft, nontender and non-distended.   No guarding, rebound or rigidity.     PSYCH: Mood and affect are appropriate     Plan:   Colonoscopy      Janann August, MD

## 2023-07-25 NOTE — Anesthesia Pre-Procedure Evaluation (Addendum)
Patient: Kelsey Pace    Procedure Information       Date/Time: 07/25/23 0900    Scheduled providers: Janann August, MD; Margo Aye, MD    Procedure: COLONOSCOPY    Location: [facility]            Relevant Problems   Anesthesia (within normal limits)      Cardio   (+) Hypertriglyceridemia      Pulmonary (within normal limits)      GI (within normal limits)      GU/Renal (within normal limits)      Endo   (+) Class 2 obesity due to excess calories without serious comorbidity with body mass index (BMI) of 38.0 to 38.9 in adult      Hematology (within normal limits)      Neuro/Psych   (+) Migraine headache      Musculoskeletal (within normal limits)      Development (within normal limits)      Genetic (within normal limits)       Clinical information reviewed:   Tobacco  Allergies  Meds   Med Hx  Surg Hx   Fam Hx  Soc Hx         Physical Exam    Airway  Mallampati: IV  TM distance: >3 FB  Mouth opening: >3 FB  Neck ROM: full     Cardiovascular   Functional capacity:  greater than or equal to 4 METS without symptoms   Dental - normal exam     Pulmonary    Abdominal   (+) obese     General   Alert                   Anesthesia Plan    ASA 2   NPO status verified    MAC     Airway: natural airway  Monitoring: standard monitors      Anesthetic plan and risks discussed with patient.  Preprocedure Evaluation: No items outstanding.    Complex patient: No

## 2023-07-25 NOTE — Anesthesia Post-Procedure Evaluation (Signed)
Patient: Kelsey Pace    Procedure Summary       Date: 07/25/23 Room / Location: [facility]    Anesthesia Start: 0829 Anesthesia Stop: 0912    Procedure: COLONOSCOPY Diagnosis:       Change in stool caliber      (Altered bowel habits: pencil thin stools)    Scheduled Providers: Janann August, MD; Margo Aye, MD Responsible Provider: Margo Aye, MD    Anesthesia Type: MAC ASA Status: 2            Anesthesia Type: MAC    Vitals Value Taken Time   BP 125/95 07/25/23 0908   Temp  07/25/23 0917   Pulse 79 07/25/23 0908   Resp 19 07/25/23 0908   SpO2 94 % 07/25/23 0908       Anesthesia Post Evaluation Note:    Patient location during evaluation: PACU  Patient participation: able to participate  Level of consciousness: arousable  Cardiovascular and Hydration status: stable  Respiratory Status Stable and Airway Patent: yes  Nausea and Vomiting Control Satisfactory: yes  Pain management: adequate     Vitals reviewed: yes  Unplanned ICU Admission: no      There were no known notable events for this encounter.

## 2023-08-04 LAB — TISSUE PATHOLOGY

## 2023-08-07 ENCOUNTER — Encounter: Attending: Obstetrics & Gynecology | Primary: Student in an Organized Health Care Education/Training Program

## 2023-08-19 ENCOUNTER — Encounter: Attending: Obstetrics & Gynecology | Primary: Student in an Organized Health Care Education/Training Program

## 2023-08-28 IMAGING — MR RM COL. LOMBO SACRA
5 series · 35 of 48 positions shown · non-contrast
Comparison: none

[Series 4: T2 · coronal · 4.0mm · 1.17mm/px · 8 of 12 slices shown (1 of 3)]
[im 1/12]
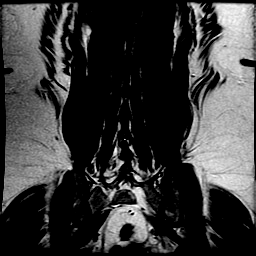
[im 2/12]
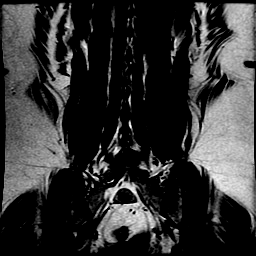
[im 4/12]
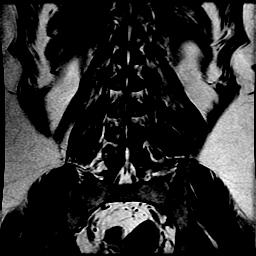
[im 5/12]
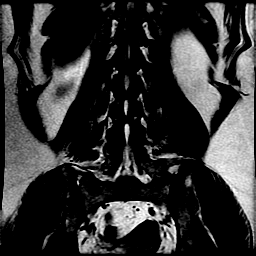
[im 7/12]
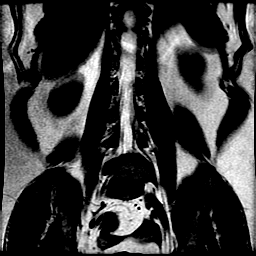
[im 8/12]
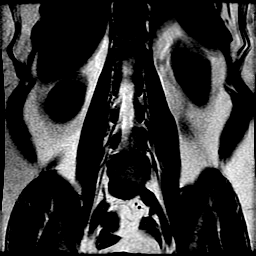
[im 10/12]
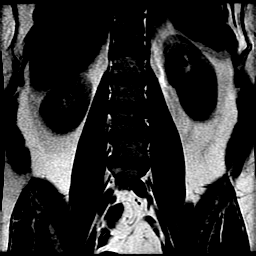
[im 12/12]
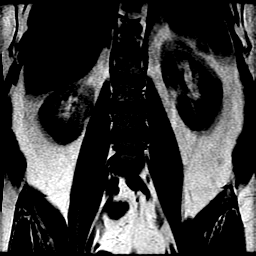

[Series 5: T2 · sagittal · 4.0mm · 1.12mm/px · 8 of 13 slices shown (2 of 3)]
[im 1/13]
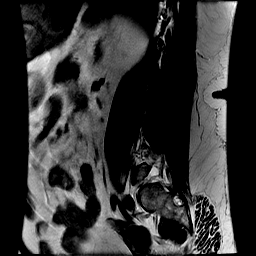
[im 2/13]
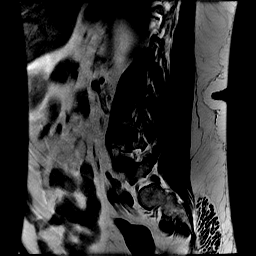
[im 4/13]
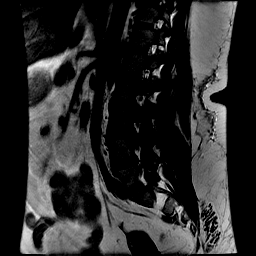
[im 6/13]
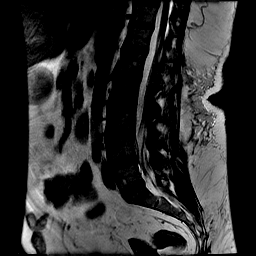
[im 7/13]
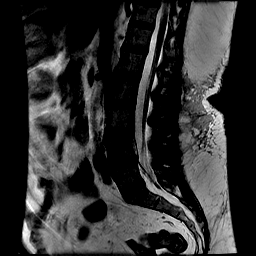
[im 9/13]
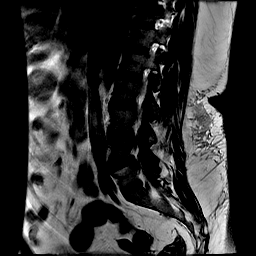
[im 11/13]
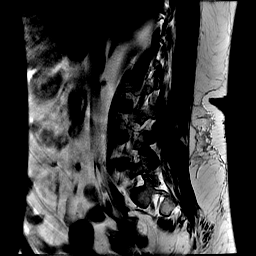
[im 13/13]
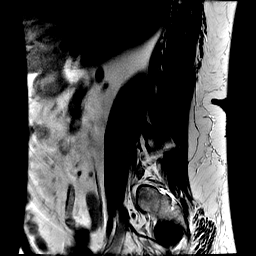

[Series 6: T1 · sagittal · 4.0mm · 1.12mm/px · 8 of 13 slices shown]
[im 1/13]
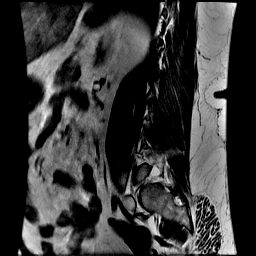
[im 2/13]
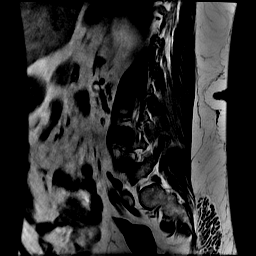
[im 4/13]
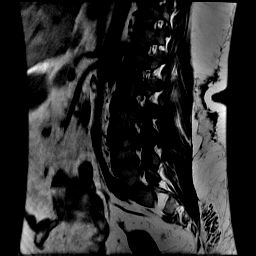
[im 6/13]
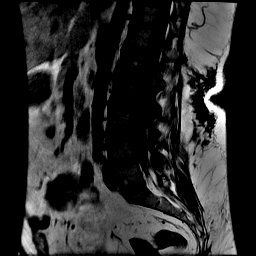
[im 7/13]
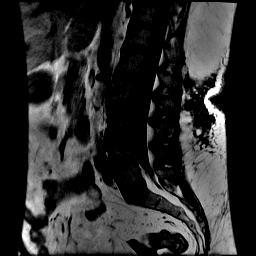
[im 9/13]
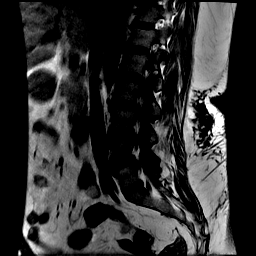
[im 11/13]
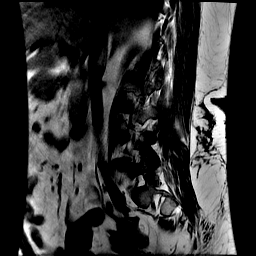
[im 13/13]
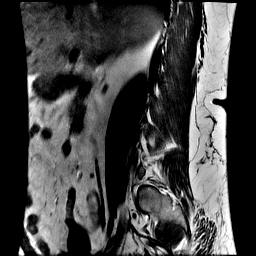

[Series 7: STIR · sagittal · 4.0mm · 0.56mm/px · 1 of 13 slices shown]
[im 1/13]
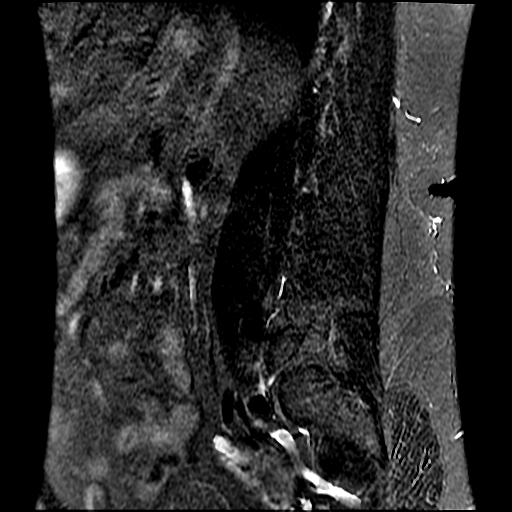

[Series 8: T2 · axial · 4.0mm · 0.39mm/px · z∈[-38,+126]mm · 10 of 25 slices shown (3 of 3)]
[im 2/25]
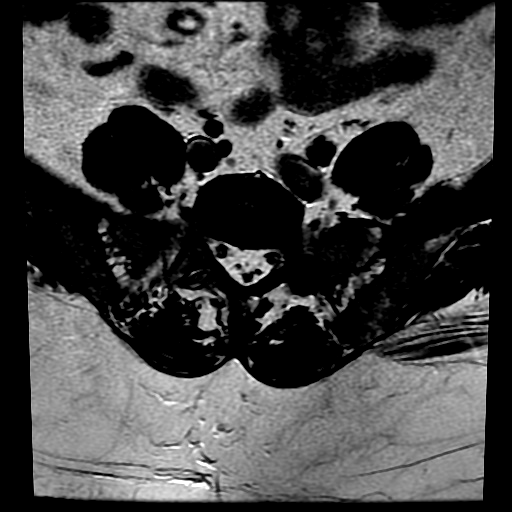
[im 4/25]
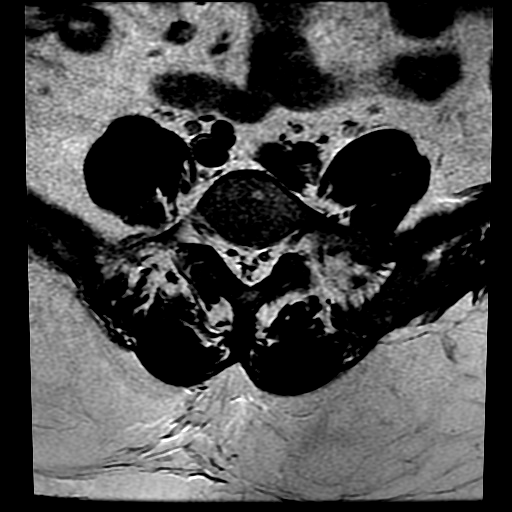
[im 5/25]
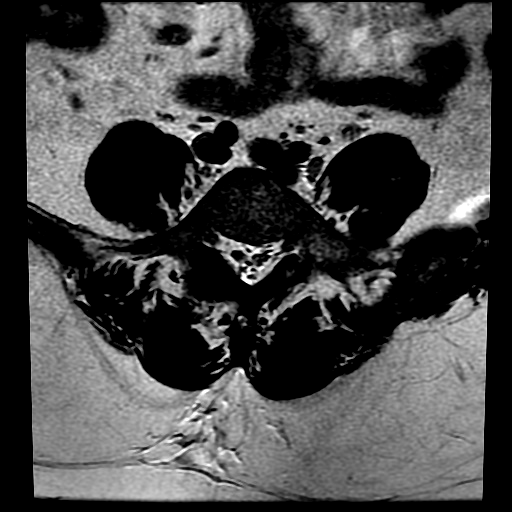
[im 9/25]
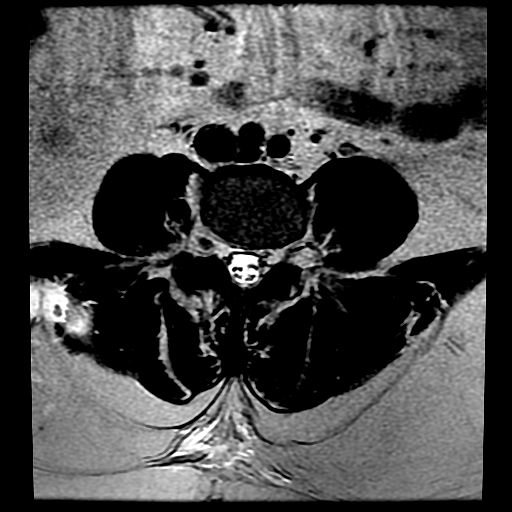
[im 12/25]
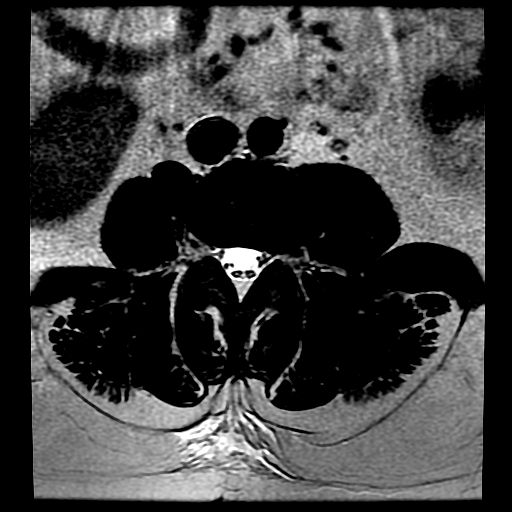
[im 13/25]
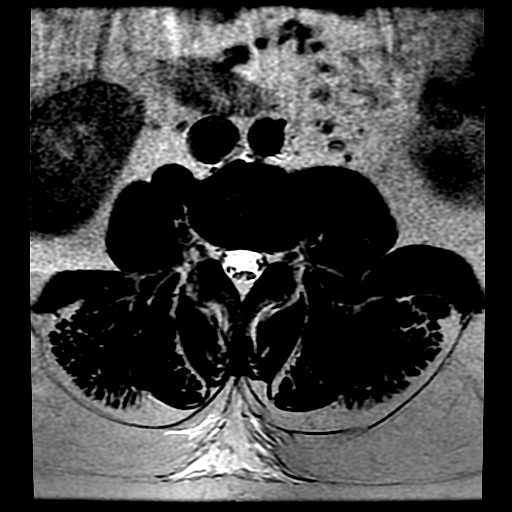
[im 15/25]
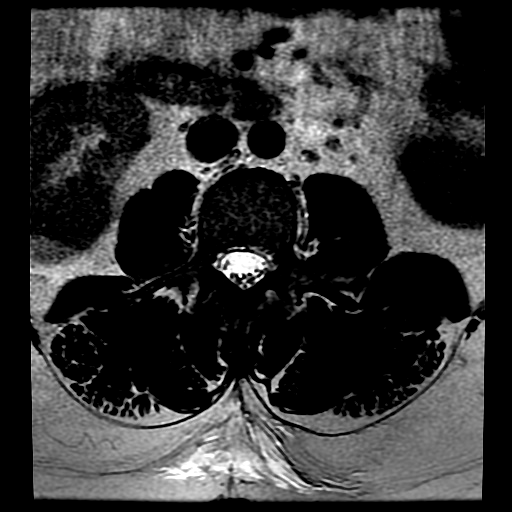
[im 18/25]
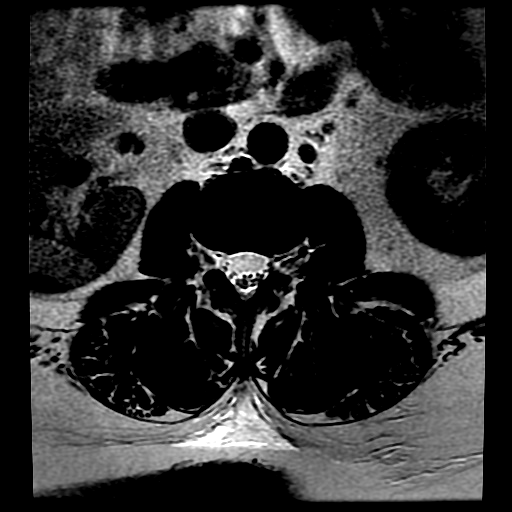
[im 21/25]
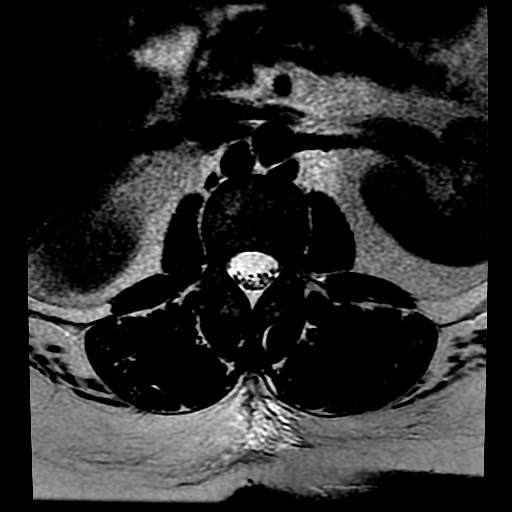
[im 25/25]
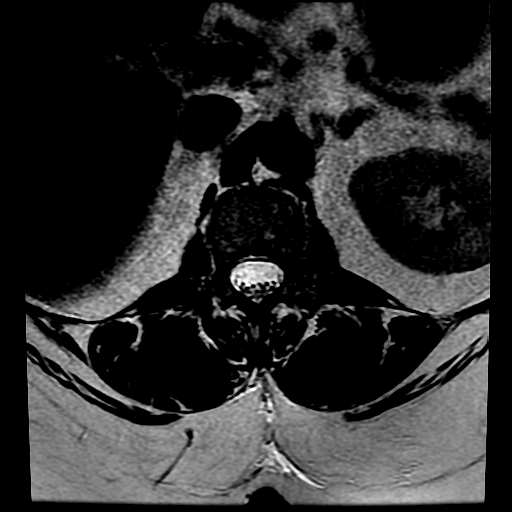

[35 of 48 positions shown; findings below may reference images not displayed]

RESSONÂNCIA MAGNÉTICA DA COLUNA LOMBOSSACRA
TÉCNICA:
Realizadas sequências FSE (fast-spin-eco), ponderadas em T1 e T2 nos planos sagital e axial.

RESULTADO:
Bom alinhamento dos corpos vertebrais lombares.
Acentuação da lordose lombar em decúbito.
Corpos vertebrais com alturas preservadas.
Osteofitose nos corpos vertebrais lombares.
Pedículos, lâminas, processos transversos e espinhosos preservados.
Artrose interfacetária em L4-L5.
Focos de hipersinal em T1 e T2 nos corpos vertebrais de L1 e L5, podendo corresponder a hemangiomas ou ainda deposições gordurosas focais.
Alteração degenerativa tipo Modic II (degeneração gordurosa) no planalto vertebral superior de L5.
Sinais de desidratação discal em L3-L4 e L4-L5.

Nível L3-L4: Abaulamento discal assimétrico com maior componente a esquerda, comprimindo a face ventral do saco dural, sem conflitos radiculares.
Nível L4-L5: Abaulamento discal simétrico , comprimindo a face ventral do saco dural, sem conflitos radiculares.

Demais discos intervertebrais sem alterações significativas.
Canal vertebral e forames intervertebrais com amplitude preservada nos demais segmentos.
Espaço liquórico livre nos demais segmentos.
Raízes nervosas foraminais preservadas.
Cone medular com contornos regulares e sinal homogêneo.
Partes moles paravertebrais com aspecto preservado.

CONCLUSÃO:
Espondiloartropatia degenerativa lombar.
Discopatia degenerativa acima descrita.

## 2023-08-28 IMAGING — MR RM JOELHO DIREITO
4 of 7 series · 23 of 40 positions shown · non-contrast
Comparison: none

[Series 2: joelho direito · axial · 5.0mm · 1.09mm/px · z∈[+5,+164]mm · 6 of 16 slices shown]
[im 1/16]
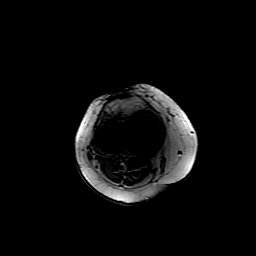
[im 4/16]
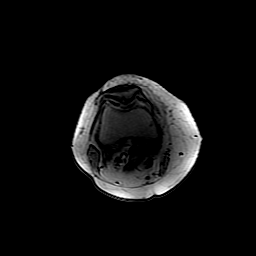
[im 7/16]
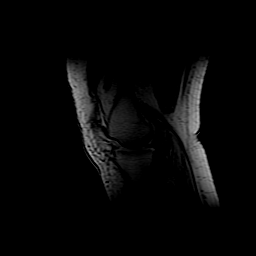
[im 10/16]
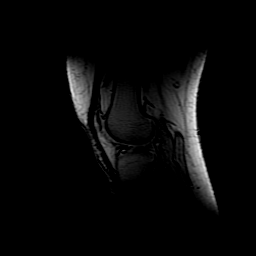
[im 13/16]
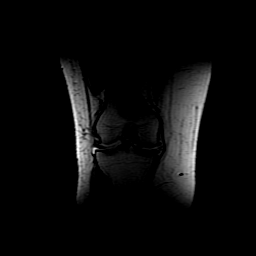
[im 16/16]
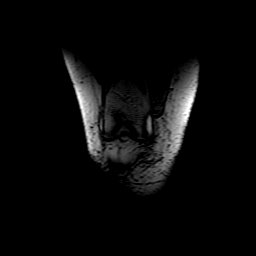

[Series 3: T2 · sagittal · 4.0mm · 0.31mm/px · 6 of 20 slices shown]
[im 1/20]
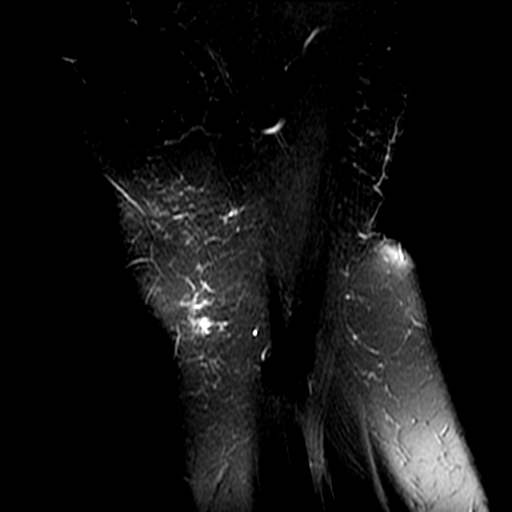
[im 4/20]
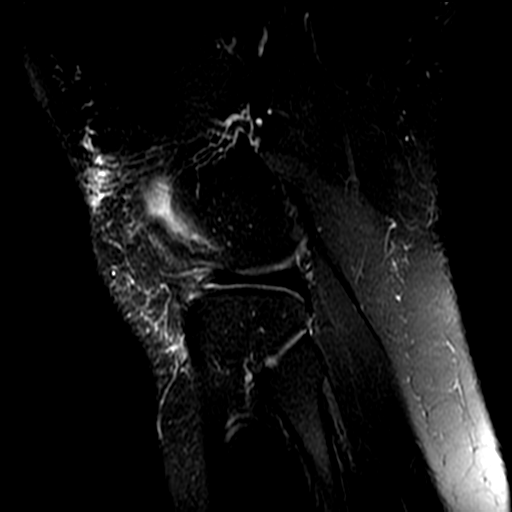
[im 8/20]
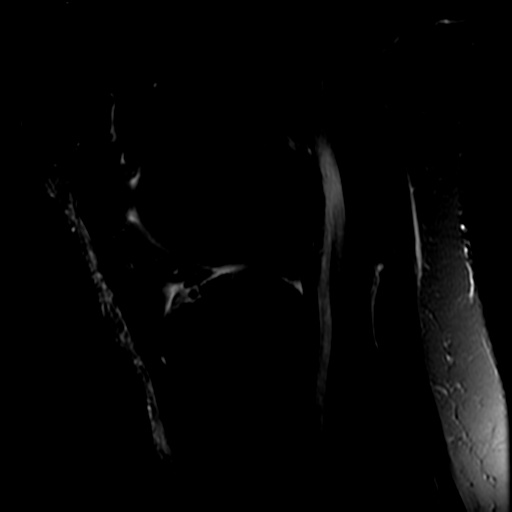
[im 12/20]
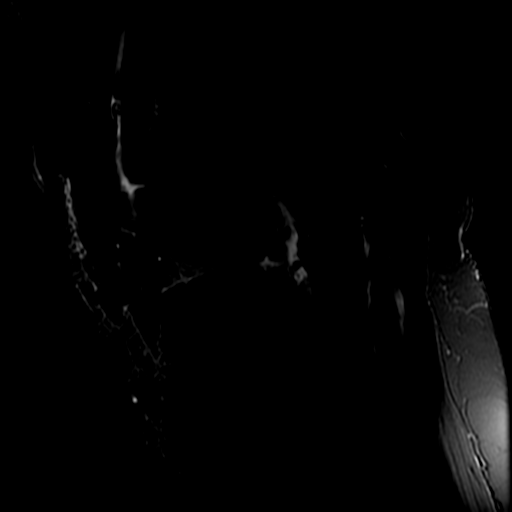
[im 16/20]
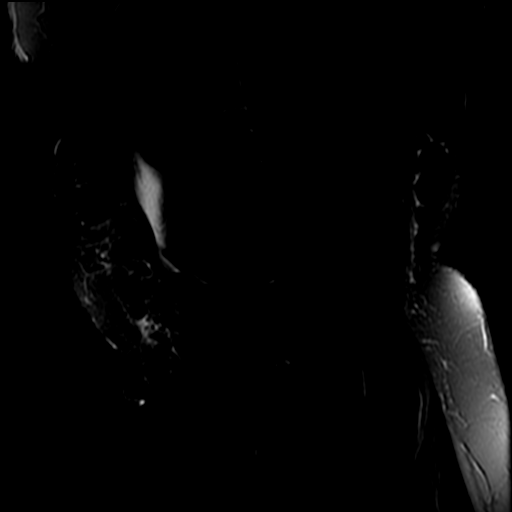
[im 20/20]
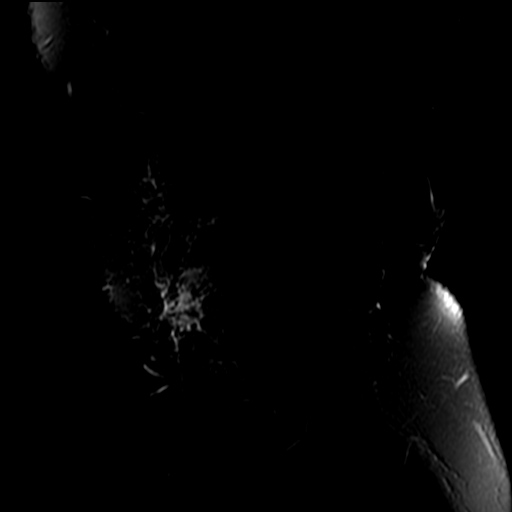

[Series 5: GRE · sagittal · 4.0mm · 0.31mm/px · 5 of 20 slices shown]
[im 1/20]
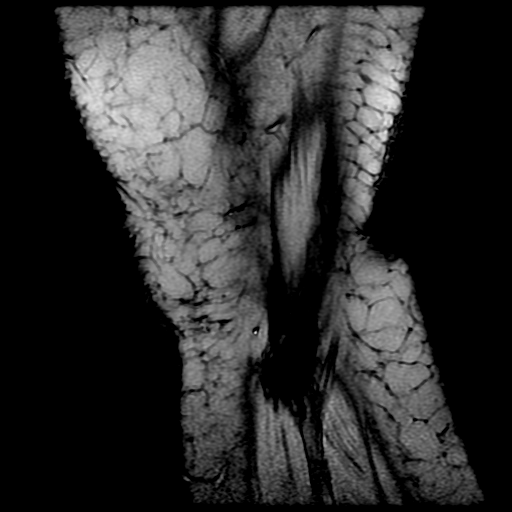
[im 4/20]
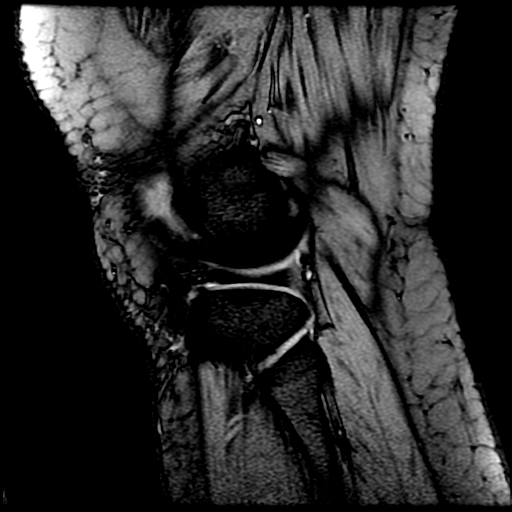
[im 8/20]
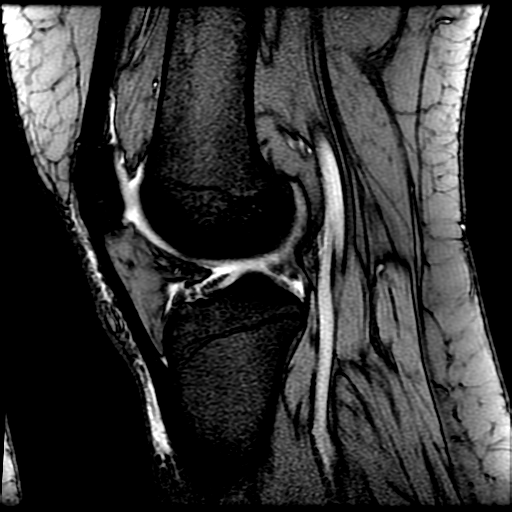
[im 12/20]
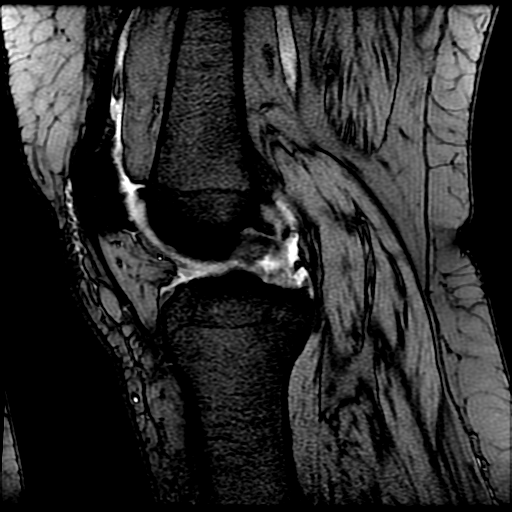
[im 20/20]
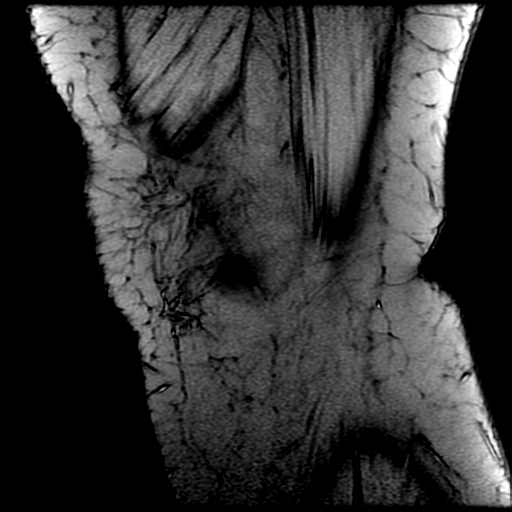

[Series 8: T1 · coronal · 4.0mm · 0.31mm/px · 6 of 20 slices shown]
[im 1/20]
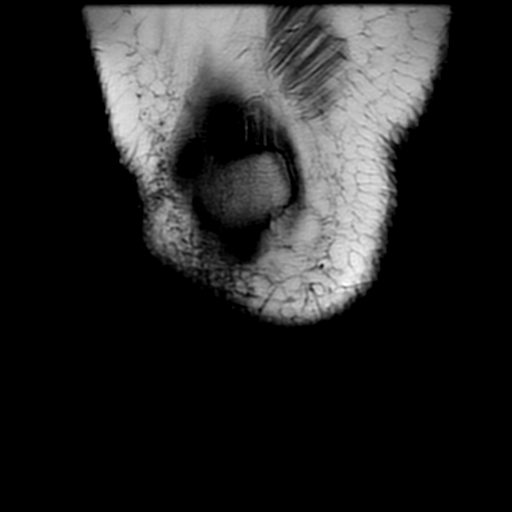
[im 4/20]
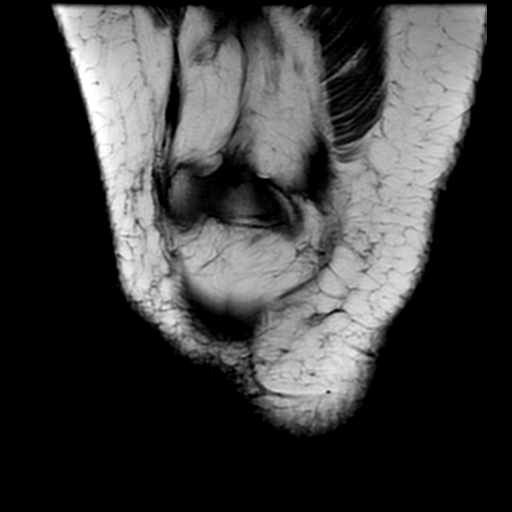
[im 8/20]
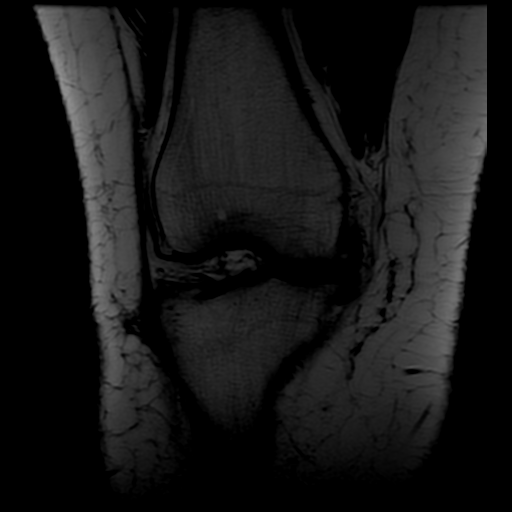
[im 12/20]
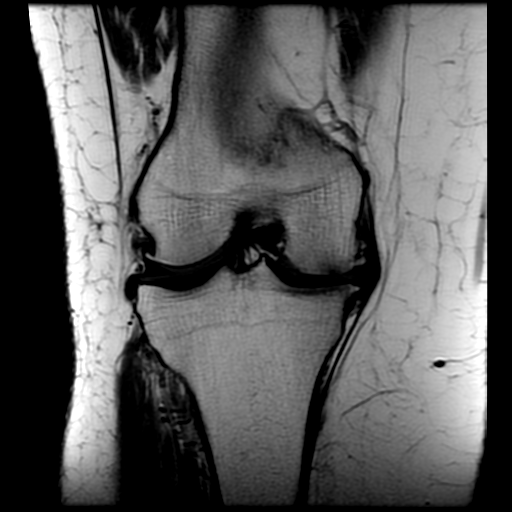
[im 16/20]
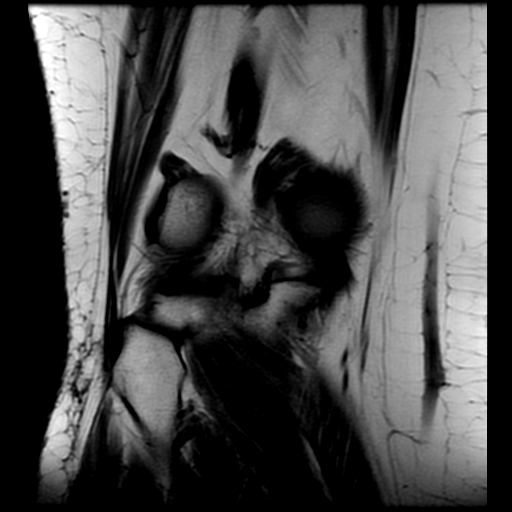
[im 20/20]
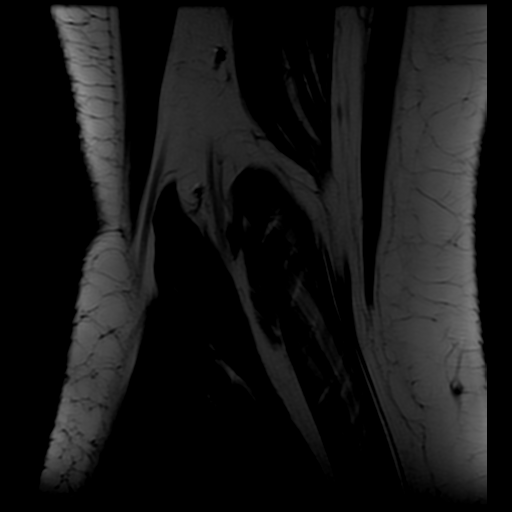

[23 of 40 positions shown; findings below may reference images not displayed]

RESSONÂNCIA MAGNÉTICA DO JOELHO DIREITO

TÉCNICA:

Exame realizado em equipamento de ressonância magnética com sequências, ponderações e planos específicos para o segmento de interesse, sem a administração endovenosa do meio de contraste.

RESULTADO:

Patela normoposicionada com o joelho em extensão.
Afilamento difuso da cartilagem de revestimento da patela com focos de edema subcondral.
Fissuras profundas da cartilagem revestimento da tróclea femoral.
Artropatia degenerativa dos compartimentos femorotibiais, com redução da interlinha articular, discreta osteofitose marginal e afilamento condral difuso, sem exposição da cortical óssea.
Menisco medial extruso e relação a interlinha articular de aspecto degenerativo.
Menisco lateral com morfologia e sinal normais.
Ligamento cruzado anterior íntegro.
Ligamento cruzado posterior normal.
Ligamento colateral medial sem alterações.
Ligamento colateral lateral normal.
Tendão quadríceps preservado.
Tendão patelar sem alterações.
Tendões da pata anserina normais.
Banda iliotibial sem alterações.
Tendão do bíceps femoral normal.
Derrame articular. 
Demais planos musculares e tendíneos preservados.

CONCLUSÃO:
Afilamento difuso da cartilagem de revestimento da patela com focos de edema subcondral.
Fissuras profundas da cartilagem revestimento da tróclea femoral.
Artropatia degenerativa dos compartimentos femorotibiais.
Menisco medial extruso e relação a interlinha articular de aspecto degenerativo.
Derrame articular.

## 2023-09-18 ENCOUNTER — Ambulatory Visit
Admit: 2023-09-18 | Discharge: 2023-09-18 | Attending: Obstetrics & Gynecology | Primary: Student in an Organized Health Care Education/Training Program

## 2023-09-18 DIAGNOSIS — Z124 Encounter for screening for malignant neoplasm of cervix: Secondary | ICD-10-CM

## 2023-09-18 NOTE — Progress Notes (Deleted)
Pap: 2017 neg/neg  Mammo: 04/2023 birads-1  Mirena since 2014, removal today

## 2023-09-18 NOTE — Progress Notes (Signed)
[facility]  [facility] Gyne[facility] [place]  [address]  [address]  [place] [address]     Patient ID: Kelsey Pace is a 57 y.o. female who presents for Gynecologic Exam (New pt - IUD removal).    Subjective   The patient is a 57 y.o. gravida / para of G3P3 who presents today for her annual gynecologic exam. She is postmenopausal and denies bleeding and other menopausal symptoms. She has no GYN, GI, GU, breast or abuse complaints. Pt has Mirena IUD placed in 2014 for contraception, due for removal. PCP was unable to visualize strings. Pt desires IUD removal today if strings located.  Her last pap was normal in 2017 and mammogram was normal in 04/2023. Hx ovarian cysts. Her last imaging in 2018 showed 3 cm cyst of left ovary. Denies Hx of STDs or abnormal pap smears. No medical problems. Feeling welll overall.         Review of Systems   Gastrointestinal:         Denies diarrhea, constipation, bloating.    Genitourinary:         Denies vaginal bleeding, dryness, discharge, pelvic pain, dysuria, frequency, urgency, SUI, dyspareunia.    Skin:         Denies breast lumps   All other systems reviewed and are negative.      Objective   Visit Vitals  Wt 98 kg   BMI 37.08 kg/m   OB Status IUD   BSA 2.1 m       Physical Exam  Exam conducted with a chaperone present.   Constitutional:       Appearance: Normal appearance. She is normal weight. She is not toxic-appearing.   HENT:      Head: Normocephalic and atraumatic.   Eyes:      Extraocular Movements: Extraocular movements intact.      Pupils: Pupils are equal, round, and reactive to light.   Chest:   Breasts:     Right: Normal.      Left: Normal.   Abdominal:      Palpations: Abdomen is soft.      Tenderness: There is no abdominal tenderness.   Genitourinary:     General: Normal vulva.      Exam position: Lithotomy position.      Vagina: Normal.      Cervix: Normal.       Uterus: Normal.       Adnexa: Right adnexa normal and left adnexa normal.   Neurological:      Mental Status: She is alert.   Psychiatric:         Mood and Affect: Mood normal.         Behavior: Behavior normal.         Thought Content: Thought content normal.         Judgment: Judgment normal.     IUD Removal    Date/Time: 09/18/2023 9:43 PM    Performed by: Paul Dykes, MD  Authorized by: Paul Dykes, MD    Consent:     Consent obtained:  Verbal and written    Consent given by:  Patient    Procedure risks and benefits discussed: yes      Patient questions answered: yes      Patient agrees, verbalizes understanding, and wants to proceed: yes      Educational handouts given: yes  Instructions and paperwork completed: yes    Procedure:     Other reason for removal:  Iud expiration  Comments:      IUD string not seen. [name] applied to cervix, strings visualized and grasped with ring forceps. IUD removed without difficulty.          Assessment/Plan   Encounter for Routine Gynecological Exam  Assessment & Plan:  - ACOG guidelines regarding pap smears reviewed with the patient.   - Clinical breast exam performed. SBE instruction provided.  - Exercise and diet recommendations reviewed with the patient.  - Yearly mammogram recommended.  - Follow up for annual exam in 1 year.    S/p Uncomplicated IUD Removal  Assessment & Plan:  Indication: Malpositioned IUD OR IUD Expiration  - The IUD was removed without difficulty. Pt denies discomfort.  - Pt instructed to call office if she has excessive bleeding, pain, cramping, or fever.     Screening for cervical cancer  Assessment & Plan:     ACOG guidelines regarding pap smears reviewed with the patient.     Pap smear done today  Orders:  Pap Smear and Aptima HPV w/ Rflx HPV Genotypes 16/18, 45 on High Risk Positive    Cyst of ovary, unspecified laterality   Orders:   - US PELVIC NON OB WITH TRANSVAGINAL; future    By signing my name below, I, Raiford Simmonds, attest  that this documentation has been prepared under the direction and in the presence of Paul Dykes MD.     All medical record entries made by the Scribe were at my direction and personally dictated by me. I have reviewed the chart and agree that the record accurately reflects my personal performance of the history, physical examination, assessment and plan. I have also personally dictated, reviewed, and agree with the discharge instructions. The patient verbally agreed to the scribe.  Paul Dykes, MDTD@ 10:40 AM

## 2023-09-18 NOTE — Result Quicknote (Signed)
Normal result

## 2023-09-25 ENCOUNTER — Inpatient Hospital Stay: Admit: 2023-09-25 | Primary: Student in an Organized Health Care Education/Training Program

## 2023-09-25 DIAGNOSIS — N83209 Unspecified ovarian cyst, unspecified side: Secondary | ICD-10-CM

## 2023-09-25 LAB — PAP SMEAR AND APTIMA HPV W RFLX TO HPV GENOTYPES 16/18, 45 ON HIGH RISK POS: HPV Aptima: NEGATIVE

## 2023-09-25 NOTE — Result Quicknote (Signed)
Normal result

## 2023-10-21 ENCOUNTER — Encounter
Payer: PRIVATE HEALTH INSURANCE | Attending: Obstetrics & Gynecology | Primary: Student in an Organized Health Care Education/Training Program

## 2024-04-21 ENCOUNTER — Ambulatory Visit
Admit: 2024-04-21 | Discharge: 2024-04-21 | Payer: PRIVATE HEALTH INSURANCE | Primary: Student in an Organized Health Care Education/Training Program

## 2024-04-21 DIAGNOSIS — R519 Headache, unspecified: Secondary | ICD-10-CM

## 2024-04-21 NOTE — Assessment & Plan Note (Addendum)
 Patient requesting repeat labs. Ordered.  Orders:    Vit D 25 hydroxy; Future

## 2024-04-21 NOTE — Telephone Encounter (Signed)
 Please start PA for MRI, thanks!

## 2024-04-21 NOTE — Progress Notes (Signed)
 [facility]  [facility]  [address]  [address] and [address]  [place] [address]  Dept: [phone]  Dept Fax: [phone]     Patient ID: Kelsey Pace is a 58 y.o. female who presents for Follow-up.  Patient declined an interpreter.    Subjective   Patient started feeling unwell a couple weeks ago  She is having headaches daily for which she is taking Tylenol.  She is feeling very tired.  She would like routine lab work.  She is trying to exercise.    HEADACHES  Patient reports she has been having daily headaches x 4 days    Course of illness  Chronicity: Prior hx of headaches but they felt different. Before, headaches were in the back of her head.    Symptoms  Location: pressure over her forehead  Characteristics: pulsing  Associated symptoms: Endorses fatigue and dizziness (described as sensation of room spinning). Eyelids feel heavy. Dizziness lasts for a couple of minutes at a time.  Pertinent negatives: Denies nausea, vomiting, photophobia, phonophobia, visual disturbance, and aura.  Alarm symptoms: Denies "thunderclap" HA, positional headache, HA precipitated with coughing. Endorses headache that wakes her up from sleep.  Exacerbating factors: HA worse with coughing. HA is worse when she is lying down.    Treatments tried  Current: Tylenol with good effect    Patient doesn't remember how her headache resolved in 2019  She doesn't know what medications she took in the past    Review of Systems  ROS per HPI above.    Problem List[1]  No current outpatient medications    Allergies[2]    Objective   Visit Vitals  BP 134/88   Pulse 75   Temp 36.9 C (98.5 F) (Oral)   Wt 102.1 kg   SpO2 96%   BMI 38.62 kg/m   OB Status IUD   BSA 2.15 m       Physical Exam  Vitals reviewed.   Constitutional:       General: She is not in acute distress.     Appearance: Normal appearance.   HENT:      Ears:       Comments: Dix-Hallpike negative to right. Dix-Hallpike maneuver caused nystagmus but not dizziness on the left.  Eyes:      Extraocular Movements: Extraocular movements intact.      Conjunctiva/sclera: Conjunctivae normal.      Pupils: Pupils are equal, round, and reactive to light.   Cardiovascular:      Rate and Rhythm: Normal rate and regular rhythm.      Heart sounds: Normal heart sounds.   Pulmonary:      Effort: Pulmonary effort is normal. No respiratory distress.      Breath sounds: Normal breath sounds.   Neurological:      General: No focal deficit present.      Mental Status: She is alert. Mental status is at baseline.      Cranial Nerves: Cranial nerves 2-12 are intact.      Sensory: Sensation is intact.      Motor: Motor function is intact.      Coordination: Coordination is intact.      Gait: Gait is intact.      Comments: UE strength is grossly symmetric/equal  LE strength is grossly symmetric/equal   Psychiatric:         Mood and Affect: Mood  normal.         Behavior: Behavior normal.         Assessment & Plan  Acute nonintractable headache, unspecified headache type  Patient presenting with 4 days of headache. Headache is similar in pattern to previous headaches, although now is located anteriorly rather than posteriorly.  On review of chart, symptoms are similar to those noted in 06/05/2018 office visit note with Dr. Valda Garnet, including sudden waking with HA, "inflamed" eyes, and dizziness. MRI was then ordered, which was unremarkable.  Discussed with patient. Discussed that HA waking patient from sleep is a red flag. However, it is reassuring that she does not have any CN deficits on exam and she has had these symptoms previously with unremarkable brain MRI, although that was in 2019. Discussed option for ED for immediate CT versus waiting for MRI. Patient prefers outpatient MRI. She understands that she should proceed immediately to ED for any new, worsening, or changing symptoms.  Stat MRI  ordered.  Continue Tylenol for pain relief. Can also try Excedrin.  Orders:    MR BRAIN W AND WO CONTRAST; Future    Vertigo  DDx includes BPPV vs vestibular migraine vs other  Dix-Hallpike maneuver caused nystagmus but not dizziness on the left. Unclear if this represents a true positive test.  Will obtain brain MRI.  Orders:    Comprehensive metabolic panel; Future    CBC; Future    TSH with reflex; Future    Vitamin D  deficiency  Patient requesting repeat labs. Ordered.  Orders:    Vit D 25 hydroxy; Future    Prediabetes  Patient requesting repeat labs. Ordered.  Orders:    Hemoglobin A1c; Future    Hyperlipidemia, unspecified hyperlipidemia type   Patient requesting repeat labs. Ordered.  Orders:    Lipid panel; Future                                        Total time spent (excluding billable services): 43 minutes.  This may include:      Preparing to see the patient.      Time the provider spends taking a patient's history.      Performing a medically appropriate exam and/or evaluation.      Counseling and educating the patient/family/caregiver.      Ordering medications, tests, or procedures.      Referring and communicating with other healthcare professionals.      Time spent documenting clinical information.      Independent interpretation of results and communicating results to the patient/family/caregiver and Care coordination.              [1]   Patient Active Problem List  Diagnosis    Endometriosis of uterus    Hypertriglyceridemia     Class 2 obesity due to excess calories without serious comorbidity with body mass index (BMI) of 38.0 to 38.9 in adult    Obstructive sleep apnea (adult) (pediatric)    Diverticula of colon    Migraine headache     Vitamin D  deficiency    Change in stool caliber    Onychomycosis    Encounter for routine checking of intrauterine contraceptive device (IUD)   [2]   Allergies  Allergen Reactions    No Known Allergies

## 2024-04-22 NOTE — Telephone Encounter (Signed)
 Please advise patient to have her labs that she requested done at [facility] as well, thanks

## 2024-04-22 NOTE — Telephone Encounter (Signed)
 PA not required for STAT MRI.   Ref #: [id number]    Called CS. Pt booked for 04/23/24 @ 9am.     SWP and relayed message. Pt showed understanding and had no questions or concerns.    No further actions. Closing note.

## 2024-04-22 NOTE — Telephone Encounter (Signed)
 Can you please process this stat then call central scheduling?

## 2024-04-23 ENCOUNTER — Inpatient Hospital Stay
Admit: 2024-04-23 | Payer: PRIVATE HEALTH INSURANCE | Primary: Student in an Organized Health Care Education/Training Program

## 2024-04-23 DIAGNOSIS — R519 Headache, unspecified: Secondary | ICD-10-CM

## 2024-04-23 MED ORDER — gadobutroL (Gadavist) syringe 10 mL
10 | Freq: Once | INTRAVENOUS | Status: AC
Start: 2024-04-23 — End: 2024-04-23
  Administered 2024-04-23: 13:00:00 10 mL via INTRAVENOUS

## 2024-04-23 NOTE — Telephone Encounter (Signed)
 SWP and relayed message. Pt showed understanding and had no questions or concerns.    No further actions. Closing note.

## 2024-04-23 NOTE — Telephone Encounter (Signed)
 Attempted to contact patient. Left voicemail with instructions to call back.     If pt calls back, please relay message below.

## 2024-04-25 LAB — COMPREHENSIVE METABOLIC PANEL
ALT: 26 IU/L (ref 0–32)
AST: 26 IU/L (ref 0–40)
Albumin: 4.2 g/dL (ref 3.8–4.9)
Alk Phosphatase: 118 IU/L (ref 44–121)
Anion Gap: 16 mmol/L (ref 10.0–18.0)
BUN/Creat Ratio: 18 (ref 9–23)
BUN: 13 mg/dL (ref 6–24)
Bili Total: 0.4 mg/dL (ref 0.0–1.2)
Calcium: 9 mg/dL (ref 8.7–10.2)
Carbon Dioxide: 22 mmol/L (ref 20–29)
Chloride: 104 mmol/L (ref 96–106)
Creat: 0.74 mg/dL (ref 0.57–1.00)
Globulin Total: 2.5 g/dL (ref 1.5–4.5)
Glucose: 96 mg/dL (ref 70–99)
Potassium: 4.3 mmol/L (ref 3.5–5.2)
Protein Total: 6.7 g/dL (ref 6.0–8.5)
Sodium: 142 mmol/L (ref 134–144)
eGFR: 94 mL/min/{1.73_m2} (ref >59)

## 2024-04-25 LAB — LIPID PANEL
Cholesterol: 205 mg/dL — ABNORMAL HIGH (ref 100–199)
HDL Cholesterol: 40 mg/dL (ref >39)
LDLc Calc (NIH): 128 mg/dL — ABNORMAL HIGH (ref 0–99)
Non-HDL Chol: 165 mg/dL — ABNORMAL HIGH (ref 0–129)
Triglycerides: 210 mg/dL — ABNORMAL HIGH (ref 0–149)
VLDLc Calc: 37 mg/dL (ref 5–40)

## 2024-04-25 LAB — TSH WITH REFLEX: TSH: 3.2 u[IU]/mL (ref 0.450–4.500)

## 2024-04-25 LAB — CBC
Hct: 41.2 % (ref 34.0–46.6)
Hgb: 14.3 g/dL (ref 11.1–15.9)
MCH: 32.2 pg (ref 26.6–33.0)
MCHC: 34.7 g/dL (ref 31.5–35.7)
MCV: 93 fL (ref 79–97)
Platelets: 217 10*3/uL (ref 150–450)
RBC: 4.44 x10E6/uL (ref 3.77–5.28)
RDW: 13 % (ref 11.7–15.4)
WBC: 6.2 10*3/uL (ref 3.4–10.8)

## 2024-04-25 LAB — VITAMIN D 25 HYDROXY: Vitamin D, 25-Hydroxy: 17.3 ng/mL — ABNORMAL LOW (ref 30.0–100.0)

## 2024-04-25 LAB — HEMOGLOBIN A1C: HgbA1C: 5.8 % — ABNORMAL HIGH (ref 4.8–5.6)

## 2024-04-27 NOTE — Telephone Encounter (Signed)
 Called pt and relayed message below. Pt has not checked her portal but will once she is out of work. Informed her to call us  back if she has any questions about the recommendations. Also informed her that NP will f/u with pt when she reviews her brain MRI      No further actions.   All topics addressed.   Signing note.

## 2024-04-27 NOTE — Telephone Encounter (Signed)
 Please call patient and advise her that I have commented on her results in the portal. If she has any questions about my recommendations, please route to RN.  I will f/u with patient once I review brain MRI.

## 2024-05-04 NOTE — Telephone Encounter (Addendum)
 Please call patient and advise her that her brain MRI was normal  Any family history of brain aneurysms?  How is she feeling?

## 2024-05-05 NOTE — Telephone Encounter (Signed)
 Cal to pt to review imaging update.  Feeling better. Headaches ae less often and less intense. Today has none.  Yesterday on and off with some pressure but did not require any tylenol.   No family hx of aneurysm that she is aware of.   Did review pt lab letter with her and she did start Vit D 3 yesterday.  Has upcoming appt with Kayla 6/11 for her CPE   New PCP Dr Owens Blue.  Update Kayla

## 2024-05-05 NOTE — Addendum Note (Signed)
 Addended by: Dekisha Mesmer on: 05/05/2024 09:50 AM     Modules accepted: Orders

## 2024-05-21 ENCOUNTER — Inpatient Hospital Stay: Admit: 2024-05-21 | Payer: PRIVATE HEALTH INSURANCE | Primary: Family Medicine

## 2024-05-21 DIAGNOSIS — Z1231 Encounter for screening mammogram for malignant neoplasm of breast: Secondary | ICD-10-CM

## 2024-05-21 DIAGNOSIS — Z0000 Encounter for general adult medical examination without abnormal findings: Secondary | ICD-10-CM

## 2024-06-09 ENCOUNTER — Ambulatory Visit: Admit: 2024-06-09 | Discharge: 2024-06-09 | Payer: PRIVATE HEALTH INSURANCE | Primary: Family Medicine

## 2024-06-09 DIAGNOSIS — Z0000 Encounter for general adult medical examination without abnormal findings: Secondary | ICD-10-CM

## 2024-06-09 NOTE — Assessment & Plan Note (Addendum)
 Chronic, no longer on CPAP, but reports fatigue, poor sleep quality, and snoring  Referral to sleep medicine  Orders:    [facility] Future

## 2024-06-09 NOTE — Assessment & Plan Note (Addendum)
 On vit D supplement.

## 2024-06-09 NOTE — Other (Signed)
 Patient Education  Table of Contents   Prediabetes: Eating Plan    To view videos and all your education online visit,  https://pe.elsevier.com/rWbuvYib  or scan this QR code with your smartphone.  Access to this content will expire in one year.  Prediabetes: Eating Plan  Prediabetes is when your levels of blood sugar, also called glucose, are higher than normal. This can put you at risk for getting type 2 diabetes.  When you have prediabetes, making healthy changes can help keep you from getting diabetes. This includes changes in your diet. Work with your health care provider or an expert in healthy eating called a dietitian. They can help you create a healthy eating plan. This plan can help you:   Control your blood sugar levels.   Improve your cholesterol levels.   Manage your blood pressure.  What are tips for following this plan?  Reading food labels   Read food labels to check the amount of fat and sugar in prepackaged foods. Avoid foods that have:  ? Saturated fats.  ? Trans fats.  ? Added sugars.   Check food labels for the amount of salt (sodium).  ? Avoid foods that have more than 300 milligrams (mg) of salt per serving.  ? Limit your salt intake to less than 2,300 mg each day.  Shopping   Avoid buying pre-made and processed foods.   Avoid buying drinks with added sugar.  Cooking   Cook with olive oil. Do not use:   ? Butter.  ? Lard.  ? Ghee.   Bake, broil, grill, steam, or boil foods. Avoid frying.  Meal planning     Work with your dietitian to create an eating plan that's right for you. This may include tracking how many calories you take in each day. Use a food diary, notebook, or mobile app to track what you eat at each meal.   Consider following a Mediterranean diet. This includes:  ? Eating many servings of fresh fruits and vegetables each day.  ? Eating fish at least twice a week.  ? Eating one serving each day of whole grains, beans, nuts, and seeds.  ? Using olive oil instead of other  fats.  ? Limiting alcohol.  ? Limiting red meat.  ? Using nonfat or low-fat dairy products.   Consider following a plant-based diet. This means eating mostly:   ? Vegetables and fruit.  ? Grains.  ? Beans.  ? Nuts and seeds.   If you have high blood pressure, you may need to limit your salt intake or follow a diet called the DASH eating plan. The DASH eating plan can help lower high blood pressure.  Lifestyle   Set weight loss goals with help from your health care team. Losing 7% of your body weight is a good goal for most people with prediabetes.   Exercise for at least 30 minutes, 5 or more days a week.   For support, think about joining a support group or talking with a mental health counselor.   Take medicines only as told.  What foods are recommended?  Fruits  Berries. Bananas. Apples. Oranges. Grapes. Papaya. Mango. Pomegranate. Kiwi. Grapefruit. Cherries.  Vegetables  Lettuce. Spinach. Peas. Beets. Cauliflower. Cabbage. Broccoli. Carrots. Tomatoes. Squash. Eggplant. Herbs. Peppers. Onions. Cucumbers. Brussels sprouts.  Grains  Whole grains, such as whole-wheat or whole-grain breads or pasta. Unsweetened oatmeal. Bulgur. Barley. Quinoa. Brown rice. Corn or whole-wheat flour tortillas or taco shells.  Meats and other proteins  Seafood. Poultry without skin. Lean cuts of pork and beef. Tofu. Eggs. Nuts. Beans.  Dairy  Low-fat or fat-free dairy products, such as yogurt, cottage cheese, and cheese.  Beverages  Water. Tea. Coffee. Sugar-free or diet soda. Seltzer water. Low-fat or nonfat milk. Milk alternatives, such as soy or almond milk.  Fats and oils  Olive oil. Canola oil. Sunflower oil. Grapeseed oil. Avocado. Walnuts.  Sweets and desserts  Sugar-free or low-fat pudding. Sugar-free or low-fat ice cream and other frozen treats.  Seasonings and condiments  Herbs. Salt-free spices. Mustard. Relish. Low-salt, low-sugar ketchup. Low-salt, low-sugar barbecue sauce. Low-fat or fat-free mayonnaise.  The items listed  above may not be all the foods and drinks you can have. Talk with a dietitian to learn more.  What foods are not recommended?  Fruits  Fruits canned with syrup.  Vegetables  Canned vegetables. Frozen vegetables with butter or cream sauce.  Grains  Refined white flour and flour products, such as bread, pasta, snack foods, and cereals.  Meats and other proteins  Fatty cuts of meat. Poultry with skin. Breaded or fried meat. Processed meats.  Dairy  Full-fat yogurt, cheese, or milk.  Beverages  Sweetened drinks, such as iced tea and soda.  Fats and oils  Butter. Lard. Ghee.  Sweets and desserts  Baked goods, such as cake, cupcakes, pastries, cookies, and cheesecake.  Seasonings and condiments  Spice mixes with added salt. Ketchup. Barbecue sauce. Mayonnaise.  The items listed above may not be all the foods and drinks you should avoid. Talk with a dietitian to learn more.  Where to find more information   American Diabetes Association: diabetes.org/food-nutrition  This information is not intended to replace advice given to you by your health care provider. Make sure you discuss any questions you have with your health care provider.  Document Released: 2015-05-02 Document Updated: 2023-07-20 Document Reviewed: 2023-07-20  Elsevier Patient Education ? 2025 [employer].

## 2024-06-09 NOTE — Patient Instructions (Addendum)
 Specific reminders and instructions:  Sleep medicine:  I have referred you to see Dr. Michael Zaslow. Please call his office to schedule an appointment.  Phone: [phone]  [address] [place], [place], [place] [address]    Please find below some general health recommendations:  - Diet. Eat a balanced diet, vegetable with every meal, fruits for snacks. Switch to more whole-grains (oatmeal, whole-wheat bread). More fish and chicken, red meat in moderation. Eliminate added sugars, processed foods, fried foods, trans-fats where possible.  - Exercise. Aim for 150 minutes/week of exercise.  - Eyes. Routine eye exam  - Dental. Dental exams every 6 months

## 2024-06-09 NOTE — Assessment & Plan Note (Addendum)
 She is working on diet modifications but still not seeing any weight loss.  She is interested in weight loss medications.  Advised her to schedule f/u to discuss further.

## 2024-06-09 NOTE — Progress Notes (Addendum)
 [facility]  [facility]  [address]  [address] and [address]  [place] [address]  Dept: [phone]  Dept Fax: [phone]     Patient ID: Kelsey Pace is a 58 y.o. female who presents for Annual Exam.    Subjective   - Kelsey Pace presents for annual physical examination.  - She reports occasional headaches, for which she takes Tylenol as needed.  - She reports feeling tired sometimes. Her husband has noted that she makes noise during sleep and has difficulty sleeping well.  - She reports constipation, for which she takes Miralax and a high fiber diet.  - She reports difficulty with reading (close vision).  - She reports a skin lesion on the back of her neck. She had skin tags removed previously.    Review of Systems   Constitutional:  Negative for fever.   HENT:  Negative for hearing loss and sore throat.    Eyes:         Positive for vision change   Respiratory:  Negative for shortness of breath.    Cardiovascular:  Negative for chest pain.   Gastrointestinal:  Positive for constipation. Negative for abdominal pain and blood in stool.       Problem List[1]  Current Outpatient Medications   Medication Instructions    cholecalciferol (vitamin D3) (VITAMIN D3) 25 mcg, Daily     Allergies[2]    The following portions of the patient's chart were reviewed and updated as appropriate: family history, social history, surgical history.    Objective   Visit Vitals  BP 130/80 (BP Location: Left arm, Patient Position: Sitting)   Pulse 84   Temp 37.1 C (98.8 F) (Oral)   Ht 1.626 m   Wt 100.8 kg   SpO2 95%   BMI 38.14 kg/m   OB Status Having periods   BSA 2.13 m       Physical Exam  Vitals reviewed.   Constitutional:       General: She is not in acute distress.     Appearance: Normal appearance.   HENT:      Right Ear: Tympanic membrane, ear canal and external ear normal.      Left Ear: Tympanic membrane, ear canal  and external ear normal.      Mouth/Throat:      Mouth: Mucous membranes are moist.      Pharynx: Oropharynx is clear.   Eyes:      Conjunctiva/sclera: Conjunctivae normal.      Pupils: Pupils are equal, round, and reactive to light.   Neck:      Thyroid: No thyromegaly.   Cardiovascular:      Rate and Rhythm: Normal rate and regular rhythm.      Pulses: Normal pulses.      Heart sounds: Normal heart sounds.   Pulmonary:      Effort: Pulmonary effort is normal. No respiratory distress.      Breath sounds: Normal breath sounds.   Abdominal:      General: Abdomen is flat. Bowel sounds are normal. There is no distension.      Palpations: Abdomen is soft. There is no mass.      Tenderness: There is no abdominal tenderness. There is no guarding or rebound.   Musculoskeletal:      Cervical back: Normal range of motion and neck supple.      Right lower  leg: No edema.      Left lower leg: No edema.   Lymphadenopathy:      Cervical: No cervical adenopathy.   Skin:     Comments: Fleshy, pedunculated lesion consistent with skin tag noted on the posterior neck.   Neurological:      General: No focal deficit present.      Mental Status: She is alert. Mental status is at baseline.      Motor: Motor function is intact.   Psychiatric:         Mood and Affect: Mood and affect normal.         Behavior: Behavior normal.         Assessment & Plan  Preventative health care  Health Maintenance:  -Diet and exercise counseling provided. Encouraged routine eye and dental exams. See AVS-Patient Instructions for specific recommendations.  -BP: 130/80  Immunizations:  -Covid-19: Due for booster. Declined.  -Tdap: UTD-2016.  -Herpes zoster: UTD.  -Pneumococcal: Due. Deferred today.  Screening:  -SDOH: Reviewed and concerns addressed elsewhere if positive.  -Depression: PHQ2/9 reviewed and concerns addressed elsewhere if positive.  -Anxiety: GAD7 reviewed and concerns addressed elsewhere if positive.  -Colorectal cancer: UTD-06/2023, repeat in  7 years.  -Cervical cancer: UTD-08/2023 NILM HPV-.  -Mammogram: UTD-04/2024, repeat already scheduled for 04/2025.  -STI screening: Patient declined.  -LDCT: N/A  - Labs done 03/2024       Class 2 obesity due to excess calories without serious comorbidity with body mass index (BMI) of 38.0 to 38.9 in adult  She is working on diet modifications but still not seeing any weight loss.  She is interested in weight loss medications.  Advised her to schedule f/u to discuss further.       Obstructive sleep apnea (adult) (pediatric)  Chronic, no longer on CPAP, but reports fatigue, poor sleep quality, and snoring  Referral to sleep medicine  Orders:    [facility] Future    Vitamin D deficiency  On vit D supplement       Acrochordon  Advised patient that she can schedule an appt for removal if desired, although insurance may not cover removal.       Migraine without status migrainosus, not intractable, unspecified migraine type   Chronic, managing with Tylenol PRN.       Positive screening for depression on 9-item Patient Health Questionnaire (PHQ-9)  Positive score due to eating habits and sleep       Vision changes  Advised to schedule exam with optometry for difficulty with myopia       Constipation, unspecified constipation type  Chronic, stable  Continue high-fiber diet, good hydration, Miralax PRN  Colonoscopy UTD  Follow up PRN       Encounter for screening examination for other mental health and behavioral disorders           Patient Health Questionnaire-9 Score: 5   Interpretation: Positive screening.     Follow-up & Interventions:   - Maintain annual screening - No additional Follow-up required.     Generalized Anxiety Disorder Screening - GAD-7 Score: 2   Interpretation: Negative screening.  Follow up & Intervention: Maintain annual screening - No additional Follow-up required        Verbal consent for AI scribe: Discussed the use of audio recording for AI-aided clinical note transcription with  all participants, who provided their verbal consent.         [1]   Patient Active  Problem List  Diagnosis    Endometriosis of uterus    Hypertriglyceridemia     Class 2 obesity due to excess calories without serious comorbidity with body mass index (BMI) of 38.0 to 38.9 in adult    Obstructive sleep apnea (adult) (pediatric)    Diverticula of colon    Migraine headache     Vitamin D deficiency    Change in stool caliber    Onychomycosis   [2]   Allergies  Allergen Reactions    No Known Allergies

## 2024-06-09 NOTE — Assessment & Plan Note (Signed)
 Chronic, managing with Tylenol PRN.

## 2024-07-25 ENCOUNTER — Emergency Department: Admit: 2024-07-26 | Payer: BLUE CROSS/BLUE SHIELD | Primary: Family Medicine

## 2024-07-25 DIAGNOSIS — R1013 Epigastric pain: Principal | ICD-10-CM

## 2024-07-25 MED ORDER — iohexol (OMNIPaque) 350 mg iodine/mL solution 100 mL
350 | Freq: Once | INTRAVENOUS | Status: AC
Start: 2024-07-25 — End: 2024-07-25
  Administered 2024-07-26: 03:00:00 100 mL via INTRAVENOUS

## 2024-07-25 MED ORDER — morphine injection 4 mg
4 | Freq: Once | INTRAVENOUS | Status: AC
Start: 2024-07-25 — End: 2024-07-25
  Administered 2024-07-26: 04:00:00 4 mg via INTRAVENOUS

## 2024-07-25 MED ORDER — pantoprazole (ProtoNix) injection 40 mg
40 | Freq: Once | INTRAVENOUS | Status: AC
Start: 2024-07-25 — End: 2024-07-26
  Administered 2024-07-26: 04:00:00 40 mg via INTRAVENOUS

## 2024-07-25 MED ORDER — ondansetron (Zofran) injection 4 mg
4 | Freq: Once | INTRAMUSCULAR | Status: AC
Start: 2024-07-25 — End: 2024-07-25
  Administered 2024-07-26: 04:00:00 4 mg via INTRAVENOUS

## 2024-07-25 NOTE — ED Provider Notes (Signed)
 [facility]  [address]  [place] [address]        HPI   Chief Complaint   Patient presents with    Chest Pain    Back Pain        Patient presenting as a walk-in reporting epigastric pain and back pain.  States pain started this evening, originally starting in the right back.  Tried Tylenol, massage and Advil.  States pain then radiated towards the epigastrium with some shortness of breath.  Pain constant and sharp.  Felt nauseated.  No diarrhea or urinary symptoms.  Reports history of elevated blood pressure reading at times taken by her family member but denies taking any antihypertensive medications.      History provided by:  Patient, medical records and relative      Patient History   Medical History[1]  Surgical History[2]  Family History[3]  Social History     Tobacco Use    Smoking status: Never    Smokeless tobacco: Never   Vaping Use    Vaping status: Never Used   Substance Use Topics    Alcohol use: Never    Drug use: Never       Review of Systems   Review of Systems    Physical Exam   ED Triage Vitals [07/25/24 2255]   Temp Pulse Resp BP   36.4 C (97.5 F) 69 16 (!) 205/103      SpO2 Temp Source Heart Rate Source Patient Position   98 % Temporal Monitor Sitting      BP Location FiO2 (%)     Right arm --       Physical Exam  Vitals and nursing note reviewed.   Constitutional:       General: She is not in acute distress.     Appearance: She is well-developed.   HENT:      Head: Normocephalic and atraumatic.   Eyes:      Conjunctiva/sclera: Conjunctivae normal.   Cardiovascular:      Rate and Rhythm: Normal rate and regular rhythm.      Heart sounds: No murmur heard.  Pulmonary:      Effort: Pulmonary effort is normal. No respiratory distress.      Breath sounds: Normal breath sounds.   Chest:      Chest wall: No tenderness.   Abdominal:      Palpations: Abdomen is soft.      Tenderness: There is no abdominal tenderness.   Musculoskeletal:         General: No  swelling.      Cervical back: Neck supple. No tenderness or bony tenderness. Normal range of motion.      Thoracic back: Spasms and tenderness present. No bony tenderness.      Lumbar back: Normal.        Back:       Right lower leg: No tenderness. No edema.      Left lower leg: No tenderness. No edema.   Skin:     General: Skin is warm and dry.      Capillary Refill: Capillary refill takes less than 2 seconds.   Neurological:      Mental Status: She is alert.   Psychiatric:         Mood and Affect: Mood normal.       CTA ABDOMEN PELVIS W AND WO CONTRAST   Final Result      No evidence of aortic dissection.  Pertinent results/recommendations were communicated to [facility] Reika Callanan by  Fairy Dames, MD via the secure [employer] messaging app 07/25/2024 11:49 PM, and receipt of the message was confirmed.   ____________________________________________      Fairy Dames, MD 07/25/2024 11:49 PM      CTA CHEST W AND WO CONTRAST   Final Result      No evidence of aortic dissection.      Pertinent results/recommendations were communicated to [facility] by  Fairy Dames, MD via the secure [employer] messaging app 07/25/2024 11:49 PM, and receipt of the message was confirmed.   ____________________________________________      Fairy Dames, MD 07/25/2024 11:49 PM          Labs Reviewed   CBC WITH DIFFERENTIAL - Abnormal       Result Value    WBC 8.5      RBC 4.36      Hemoglobin 13.5      Hematocrit 41.6      MCV 95.4      MCH 31.0      MCHC 32.5      RDW-CV 13.0      RDW-SD 45.7      Platelets        MPV        Neutrophil % 58.3      Lymphocyte % 25.3      Monocytes % 9.2      Eosinophils % 6.1      Basophils % 0.7      Immature Granulocytes % 0.4      NRBC % 0.0      Neutrophils Absolute 4.93      Lymphocytes Absolute 2.14      Monocytes Absolute 0.78 (*)     Eosinophils Absolute 0.52 (*)     Basophils Absolute 0.06      Immature Granulocytes Absolute 0.03      NRBC Absolute 0.00     MAGNESIUM - Normal    Magnesium 1.8      TROPONIN I, HIGH SENSITIVITY - Normal    Troponin I, High Sensitivity <6      Narrative:     Clinical correlation, HEART Score and shared decision making must be taken into account.                   For Chest Pain >3 Hours    Rule-Out Criteria              Single Value     0hr/1hr Delta Value  Female  <10ng/L*          <54ng/L AND delta <15ng/L  Female    <10ng/L*          <79ng/L AND delta <15ng/L    Cannot Rule-Out                            0hr/1hr Delta Value  Female    <54ng/L AND delta >15ng/L    >/= 54 AND delta </=15ng/L  Female      <79ng/L AND delta >15ng/L    >/= 79 AND delta </=15ng/L    Rule-In Criteria               Single Value   0hr/1hr Delta Value                 Female   >115ng/L       >/=54ng/L AND delta >/=15ng/L  Female     >115ng/L       >/=79ng/L AND delta >/=15ng/L           *Note: for Chest pain <3 hours 0hr/1hr is warranted for evaluation.     LIPASE - Normal    Lipase 57     PLATELET COUNT - Normal    Platelets 195     CBC W/DIFF    Narrative:     The following orders were created for panel order CBC and differential.  Procedure                               Abnormality         Status                     ---------                               -----------         ------                     CBC w/ Differential[[id number]]          Abnormal            Final result                 Please view results for these tests on the individual orders.   COMPREHENSIVE METABOLIC PANEL    Sodium 142      Potassium 4.1      Chloride 106      CO2 (Bicarbonate) 29      Anion Gap 7      BUN 16      Creatinine 1.02      eGFRcr 64      Glucose 112      Fasting? Unknown      Calcium  8.7      AST 20      ALT 33      Alkaline phosphatase 124      Protein, total 6.4      Albumin 3.5      Bilirubin, total 0.6         Glasgow Coma Scale Score: 15      ED Course & MDM   Diagnoses as of 07/26/24 0055   Epigastric pain   Elevated blood pressure reading       Medical Decision Making  History obtained from: patient,  medical records  Chronic conditions affecting care: denies  Differential diagnosis: r/o dissection, gallstones, gastritis  Review of non-ED record: care everywhere was reviewed  I performed an independent interpretation of labs/tests/imaging:   I have ordered a CT torso dissection protocol.  Labs were reviewed.  Normal CMP.  Unremarkable CBC.  Undetectable troponin.  Normal lipase.  CT without evidence of dissection or pneumothorax or effusion or infiltrates.  No pericardial effusion.  No osseous abnormality.  Normal pancreas and hepatobiliary systems and kidneys.  Patient felt improved with morphine  and then Protonix .  Blood pressure has improved.  I offered her hospitalization for further evaluation but she declined.  She would rather follow-up with her primary care provider.  Daughter is present as well and agrees with plan of care.  I did send a prescription for Protonix  to the pharmacy as she did respond to it well.  She understands she can return  at any point if she changes her mind or has any worsening symptoms.    Discussion with other providers: None    Tests and medications considered but not ordered/prescribed: Antibiotic    Consideration of admission: considered, patient offered hospitalization but declined    Care affected by Social Detriments of Health: none        Problems Addressed:  Elevated blood pressure reading: complicated acute illness or injury  Epigastric pain: complicated acute illness or injury    Amount and/or Complexity of Data Reviewed  Labs: ordered.  Radiology: ordered.  ECG/medicine tests: ordered and independent interpretation performed.    Risk  Prescription drug management.                            [1]   Past Medical History:  Diagnosis Date    Bilateral ovarian cysts     Diverticula of colon     Encounter for routine checking of intrauterine contraceptive device (IUD) 05/09/2023    GERD (gastroesophageal reflux disease)     History of migraine headaches     Hypertriglyceridemia       Obesity     OSA (obstructive sleep apnea)     Vitamin D  deficiency    [2]   Past Surgical History:  Procedure Laterality Date    COLONOSCOPY     [3]   Family History  Problem Relation Name Age of Onset    Hypertension Mother      Arrhythmia Mother          pacemaker    Other (terrorist victim) Father  75        [place]    Asthma Sister      Asthma Brother      Hypertension Brother      Asthma Maternal Grandmother      Breast cancer Neg Hx          Venetia Maffucci, MD  07/26/24 [phone]

## 2024-07-25 NOTE — ED Procedure Note (Signed)
 Procedure  ECG 12 lead    Performed by: Venetia Maffucci, MD  Authorized by: Venetia Maffucci, MD    ECG interpreted by ED Physician in the absence of a cardiologist: yes    Rate:     ECG rate:  60    ECG rate assessment: normal    Rhythm:     Rhythm: sinus rhythm    Ectopy:     Ectopy: none    QRS:     QRS axis:  Normal  ST segments:     ST segments:  Normal  T waves:     T waves: normal                 Venetia Maffucci, MD  07/25/24 2334

## 2024-07-25 NOTE — ED Triage Notes (Deleted)
 Pt in for two hours of increasing R side abdominal pain. Was recently seen here 1 week ago and diagnosed with 4mm stone.

## 2024-07-25 NOTE — ED Triage Notes (Signed)
 Pt to triage, c/o mid back pain radiating through to her chest since this morning, now radiating down right arm.  Took apap and advil with no relief.  Endorses SOB with pain

## 2024-07-26 ENCOUNTER — Inpatient Hospital Stay
Admit: 2024-07-26 | Discharge: 2024-07-26 | Disposition: A | Discharge status: Home / Self Care | Payer: BLUE CROSS/BLUE SHIELD | Arrived: VH | Attending: Surgery

## 2024-07-26 LAB — CBC WITH DIFFERENTIAL
Basophils %: 0.7 %
Basophils Absolute: 0.06 K/uL (ref 0.00–0.22)
Eosinophils %: 6.1 %
Eosinophils Absolute: 0.52 K/uL — ABNORMAL HIGH (ref 0.00–0.50)
Hematocrit: 41.6 % (ref 32.0–47.0)
Hemoglobin: 13.5 g/dL (ref 11.0–16.0)
Immature Granulocytes %: 0.4 %
Immature Granulocytes Absolute: 0.03 K/uL (ref 0.00–0.10)
Lymphocyte %: 25.3 %
Lymphocytes Absolute: 2.14 K/uL (ref 0.70–4.00)
MCH: 31 pg (ref 26.0–34.0)
MCHC: 32.5 g/dL (ref 31.0–37.0)
MCV: 95.4 fL (ref 80.0–100.0)
Monocytes %: 9.2 %
Monocytes Absolute: 0.78 K/uL — ABNORMAL HIGH (ref 0.36–0.77)
NRBC %: 0 % (ref 0.0–0.0)
NRBC Absolute: 0 K/uL (ref 0.00–2.00)
Neutrophil %: 58.3 %
Neutrophils Absolute: 4.93 K/uL (ref 1.50–7.95)
RBC: 4.36 M/uL (ref 3.70–5.20)
RDW-CV: 13 % (ref 11.5–14.5)
RDW-SD: 45.7 fL (ref 35.0–51.0)
WBC: 8.5 K/uL (ref 4.0–11.0)

## 2024-07-26 LAB — COMPREHENSIVE METABOLIC PANEL
ALT: 33 U/L (ref 0–55)
AST: 20 U/L (ref 6–42)
Albumin: 3.5 g/dL (ref 3.2–5.0)
Alkaline phosphatase: 124 U/L (ref 30–130)
Anion Gap: 7 mmol/L (ref 3–14)
BUN: 16 mg/dL (ref 6–24)
Bilirubin, total: 0.6 mg/dL (ref 0.2–1.2)
CO2 (Bicarbonate): 29 mmol/L (ref 20–32)
Calcium: 8.7 mg/dL (ref 8.5–10.5)
Chloride: 106 mmol/L (ref 98–110)
Creatinine: 1.02 mg/dL (ref 0.55–1.30)
Glucose: 112 mg/dL (ref 70–139)
Potassium: 4.1 mmol/L (ref 3.6–5.2)
Protein, total: 6.4 g/dL (ref 6.0–8.4)
Sodium: 142 mmol/L (ref 135–146)
eGFRcr: 64 mL/min/1.73m*2 (ref >=60)

## 2024-07-26 LAB — LIPASE: Lipase: 57 U/L (ref 13–75)

## 2024-07-26 LAB — PLATELET COUNT: Platelets: 195 K/uL (ref 150–400)

## 2024-07-26 LAB — TROPONIN I, HIGH SENSITIVITY: Troponin I, High Sensitivity: <6 ng/L

## 2024-07-26 LAB — MAGNESIUM: Magnesium: 1.8 mg/dL (ref 1.6–2.6)

## 2024-07-26 MED ORDER — pantoprazole (ProtoNix) 40 mg EC tablet
40 | ORAL_TABLET | Freq: Every day | ORAL | 0 refills | 30.00000 days | Status: AC
Start: 2024-07-26 — End: 2024-08-02

## 2024-07-26 MED FILL — MORPHINE 4 MG/ML INTRAVENOUS SOLUTION WRAPPER: 4 4 mg/mL | INTRAVENOUS | Qty: 1 | Fill #0

## 2024-07-26 MED FILL — PANTOPRAZOLE 40 MG INTRAVENOUS SOLUTION: 40 40 mg | INTRAVENOUS | Qty: 40 | Fill #0

## 2024-07-26 MED FILL — ONDANSETRON HCL (PF) 4 MG/2 ML INJECTION SOLUTION: 4 4 mg/2 mL | INTRAMUSCULAR | Qty: 2 | Fill #0

## 2024-07-26 NOTE — Telephone Encounter (Signed)
 Spoke with pt - scheduled ED follow up

## 2024-07-26 NOTE — Telephone Encounter (Signed)
 Hello,    Patient is calling to schedule an ER follow up appointment next available on my end is until Nov. Best number [phone]

## 2024-07-26 NOTE — Discharge Instructions (Signed)
 CT scans and blood work were stable.  Follow up with your pcp.  Return to ER if worsening or concerns.

## 2024-07-27 ENCOUNTER — Ambulatory Visit
Admit: 2024-07-27 | Discharge: 2024-07-27 | Payer: BLUE CROSS/BLUE SHIELD | Attending: Family Medicine | Primary: Family Medicine

## 2024-07-27 ENCOUNTER — Inpatient Hospital Stay: Admit: 2024-07-27 | Payer: BLUE CROSS/BLUE SHIELD | Primary: Family Medicine

## 2024-07-27 DIAGNOSIS — M545 Low back pain, unspecified: Principal | ICD-10-CM

## 2024-07-27 NOTE — Progress Notes (Signed)
 [facility]  [address]  [address]  [place] [address]  Dept: [phone]  Dept Fax: [phone]     Patient ID: Kelsey Pace is a 58 y.o. female who presents for Follow-up, Shortness of Breath (Due to pain), and swolen arm\ (Fluid in arm).    Subjective  ER Note:  (07/25/2024) Patient presenting as a walk-in reporting epigastric pain and back pain. States pain started this evening, originally starting in the right back. Tried Tylenol, massage and Advil. States pain then radiated towards the epigastrium with some shortness of breath. Pain constant and sharp. Felt nauseated. No diarrhea or urinary symptoms. Reports history of elevated blood pressure reading at times taken by her family member but denies taking any antihypertensive medications. Differential diagnosis: r/o dissection, gallstones, gastritis. Normal CMP. Unremarkable CBC. Undetectable troponin. Normal lipase. CT without evidence of dissection or pneumothorax or effusion or infiltrates. No pericardial effusion. No osseous abnormality. Normal pancreas and hepatobiliary systems and kidneys. Patient felt improved with morphine  and then Protonix . Blood pressure has improved. I offered her hospitalization for further evaluation but she declined. She would rather follow-up with her primary care provider. Problems Addressed: Elevated blood pressure reading: complicated acute illness or injury. Epigastric pain: complicated acute illness or injury.  - States she also had numbness and tingling of right arm    Back pain and abdo pain:  - mid line thoracic back pain and right sided pain that radiates to abdomen  - Feels more pain when sitting up straight  - Complains of pain with breathing  - Pain radiates  - No trauma or lifting  - Has been taking Tylenol which helps a little bit  - States she doesn't notice a difference on the pantoprazole  (PROTONIX )  - Denies injury or Ibuprofen  intake  - Complains of constipation; has been taking medication for constipation PRN; states it hasn't been helping lately  - Rates pain today as a 6 out of 10  - No weakness in legs or arms.  - Some nausea, no vomiting.    - No fevers or HA.  - No loss of bladder or bowel control.      Right Arm Pain:  - Complains of right arm pain due to the contrast for the CT in the hospital (INCORRECTLY INJECTED INTO THE ARM)  - Complains of swelling SINCE THIS CT  - Has been taking Tylenol which helps a little bit  - Pt had numbness and continues to have numbness PRIOR to CT.    Back Pain  The current episode started in the past 7 days. The problem occurs constantly. The problem has been waxing and waning since onset. The quality of the pain is described as shooting. The pain is at a severity of 6/10. The pain is The same all the time. The symptoms are aggravated by lying down, sitting, position and standing. Stiffness is present All day. Associated symptoms include abdominal pain.     Review of Systems   Respiratory:  Positive for shortness of breath.    Gastrointestinal:  Positive for abdominal pain, constipation and nausea.   Musculoskeletal:  Positive for back pain.   All other systems reviewed and are negative.      Current Outpatient Medications   Medication Instructions   . cholecalciferol (vitamin D3) (VITAMIN D3) 25 mcg, Daily   . pantoprazole  (PROTONIX ) 40 mg, oral, Daily before breakfast, Do not crush, chew, or split.  Objective  Visit Vitals  BP 120/78 (BP Location: Left arm, Patient Position: Sitting, BP Cuff Size: Adult)   Pulse 76   Temp 36.8 C (98.2 F) (Oral)   Wt 100.9 kg   SpO2 96%   BMI 38.17 kg/m   OB Status Postmenopausal   BSA 2.13 m     Physical Exam  Vitals and nursing note reviewed. Exam conducted with a chaperone present.   Constitutional:       General: She is not in acute distress.     Appearance: Normal appearance. She is obese.   HENT:      Head: Normocephalic and atraumatic.      Right  Ear: External ear normal.      Left Ear: External ear normal.   Eyes:      General:         Right eye: No discharge.         Left eye: No discharge.      Conjunctiva/sclera: Conjunctivae normal.   Cardiovascular:      Rate and Rhythm: Normal rate and regular rhythm.      Heart sounds: Normal heart sounds. No murmur heard.  Pulmonary:      Effort: Pulmonary effort is normal. No respiratory distress.      Breath sounds: Normal breath sounds.   Abdominal:      Tenderness: There is abdominal tenderness. There is no guarding.   Musculoskeletal:         General: No tenderness (right > left sided thoracic pain, in band around to upper abdomen.  No changes of skin.). Normal range of motion.      Cervical back: Normal range of motion.      Right lower leg: No edema.      Left lower leg: Edema present.   Skin:     General: Skin is warm.      Findings: No rash.   Neurological:      Mental Status: She is alert and oriented to person, place, and time. Mental status is at baseline.      Cranial Nerves: No cranial nerve deficit.      Sensory: Sensory deficit (subjective numbness right hand, no weakness.  mild edema right arm with ttp.) present.      Motor: No weakness.      Coordination: Coordination normal.      Gait: Gait normal.      Deep Tendon Reflexes: Reflexes normal.   Psychiatric:         Mood and Affect: Mood normal.         Behavior: Behavior normal.     Assessment/Plan  Assessment & Plan  Acute right-sided low back pain without sciatica  Pain of right upper extremity  Band like pain from thoracic area around to abdomen R>L but both sides effected  + SOB, right hand numbness.  No fevers, weakness.  Offered ED eval for urgent MRI, pt declined.   TTP thoracic vertebrae - ordered xray to eval spine  Understands to go to ED if any worsening of pain, weakness in extremities, loss of bowels/bladder, fevers, vomiting or any other concerns.  Ideally need MRI thoracic spine to rule transverse myelitis.  Ordered STAT.     Orders:  .  XR THORACIC SPINE 3 VIEWS; Future  .  MR THORACIC SPINE W AND WO CONTRAST; Future    Constipation, unspecified constipation type  - Will continue to monitor  All medical record entries made by the Scribe were at my direction and personally  dictated by me. I have reviewed the chart and agree that the record accurately  reflects my personal performance of the history, physical examination, assessment  and plan. I have also personally dictated, reviewed, and agree. The patient verbally  agreed to the scribe.   Stryker Veasey, MD 07/27/24 1:43 PM            By signing my name below, I, Rosina Area, attest that this documentation has been prepared under the direction and in the presence of Cameshia Cressman, MD. Electronically signed: Rosina Area

## 2024-07-27 NOTE — Telephone Encounter (Signed)
 PA Order ID: [id number]       Approval Valid Through: 07/27/2024 - 09/24/2024      Tried contacting patient but no success so LVM telling her that it got approved and further details about approval.      No further actions.   All topics addressed.   Signing note.

## 2024-07-27 NOTE — Telephone Encounter (Signed)
 Scheduled pt for MRI at 12:30pm at [address] [facility] in [place] daughter and informed her.    No further actions.   All topics addressed.   Signing note. \

## 2024-07-27 NOTE — Patient Instructions (Addendum)
 Great to see you today!       IF ANYTHING IS WORSENING OR IF YOU START TO EXPERIENCE MORE NUMBNESS, ANY WEAKNESS, LOSS OF BLADDER OR BOWEL CONTROL, PLEASE GO RIGHT TO THE ED.     I will order an X-Ray. You can walk in to either [facility] or [facility] to get this; you don't need any paperwork from me. I will give you a call to discuss the results once I receive them.  I AM TRYING TO GET AN MRI COVERED.  For constipation: Miralax 1 capful daily with 8 oz of water + eat prunes 3 times daily. You can bump the Miralax to 1 capful twice daily if you are unsuccessful at evacuating the bowels with once daily dosing. You can take a stool softener such as over the counter Colace to help soften the stool to help you pass it. If you are unsuccessful using the above options, you can opt for a warm water enema or Bisacodyl suppository which are available over the counter. Drink water throughout the day!  Daily fiber recommendation 25-30g through diet- if not getting it through diet Metamucil can help. Exercise, daily movement! The more active you are the more active the bowels are.   Elevate your feet on a stool while you are seated having a bowel movement to promote a more natural position for evacuation. Try to avoid prolonged sitting or straining.    Please continue with a healthy, balanced diet and routine exercise.      Thank you, Nishant Schrecengost    If you have any new questions or concerns, call the office.

## 2024-07-27 NOTE — Telephone Encounter (Signed)
 Xray normal. Please let pt know.    If cannot reach pt, please call her daughter with that info AND the info for the MRI that we have gotten approved!!!   Daughter is emergency contact

## 2024-07-27 NOTE — Telephone Encounter (Addendum)
 The patient's daughter called back and stated that she contacted Central Scheduling to book the MRI appointment, but they informed her that there is nothing on file. She states that PCP wanted her to book this appointment urgently. Please call patient back.       Best call back : [phone]

## 2024-07-27 NOTE — Telephone Encounter (Signed)
 Called and spoke to daughter and let her know the info about the xray results and also that MRI PA got approved and to call [facility] central scheduling at [phone] to schedule the appointment for the MRI. She understood wrote the phone number now and she will call and make the appointment for Urgently. I let her know that if she has any questions to please not hesitate to call us  back at out office anytime. Then the call ended.       No further actions.   All topics addressed.   Signing note.

## 2024-07-27 NOTE — ED Notes (Signed)
 Call made.  Pt states is feeling a little better but continues with arm discomfort.  Fair appetite.  States will follow up with PCP or will return to ED with problems or concerns.     Avelina Loose, RN  07/27/24 1031

## 2024-07-29 ENCOUNTER — Inpatient Hospital Stay: Admit: 2024-07-29 | Payer: BLUE CROSS/BLUE SHIELD | Primary: Family Medicine

## 2024-07-29 DIAGNOSIS — M545 Low back pain, unspecified: Principal | ICD-10-CM

## 2024-07-29 MED ORDER — gadobutroL (Gadavist) syringe 10 mL
10 | Freq: Once | INTRAVENOUS | Status: AC
Start: 2024-07-29 — End: 2024-07-29
  Administered 2024-07-29: 17:00:00 10 mL via INTRAVENOUS

## 2024-07-29 MED ORDER — methylPREDNISolone (Medrol Dospak) 4 mg tablets
4 | ORAL_TABLET | ORAL | 0 refills | 6.00000 days | Status: AC
Start: 2024-07-29 — End: 2024-08-05

## 2024-07-29 NOTE — Telephone Encounter (Signed)
 Please call monday to check in

## 2024-07-29 NOTE — Telephone Encounter (Signed)
 Updated Kelsey Pace,dgtr with MRI results.  Pt pain is unchanged from the office visit on 7/29   Tylenol still with only small amt of effect.  Update to Dr Gabino

## 2024-07-29 NOTE — Telephone Encounter (Addendum)
 Called daughter  Pain is the same, no new symptoms.  Reassuring imaging, reassuring that pain is not worsening or changing.    Reviewed MRI, suspect pain is from slight disc bulge  Trial of  medrol   dose pack and PT

## 2024-07-29 NOTE — Telephone Encounter (Signed)
 Pls let daughter know (number in chart) her mom's MRI shows a little bit of arthritis but otherwise normal  How are her symptoms?

## 2024-08-02 NOTE — Telephone Encounter (Signed)
 Call to dgtr who reports she has not spoke to mom this am but as of yesterday she was doing better  Mom told Dedra that she felt the medicine was helping.   Have  not set up PT appt but have the number and will.  Instructed to call if symptoms worsen.  Update Dr Gabino

## 2024-09-22 ENCOUNTER — Ambulatory Visit
Admit: 2024-09-22 | Discharge: 2024-09-22 | Payer: BLUE CROSS/BLUE SHIELD | Attending: Family Medicine | Primary: Family Medicine

## 2024-09-22 DIAGNOSIS — E782 Mixed hyperlipidemia: Principal | ICD-10-CM

## 2024-09-22 LAB — POCT HGB A1C: POCT Hemoglobin A1C: 5.7 % (ref ?–5.7)

## 2024-09-22 NOTE — Patient Instructions (Signed)
 PLEASE CHANGE YOUR APPT IN JANUARY TO A FOLLOW UP for Weight  Please book a physical in June (last seen June 2025)     Please book an appt with Dr Waddell for weight management.

## 2024-09-22 NOTE — Progress Notes (Signed)
 [facility]  [address]  [address] and [address]  [place] [address]  Dept: [phone]  Dept Fax: [phone]     Patient ID: Kelsey Pace is a 58 y.o. female who presents for Follow-up.    Subjective  HPI  History of Present Illness  The patient presents for a weight loss follow-up discussion. She was last seen in July 2025.    She reports that her back pain has subsided, although it occasionally recurs. She has been engaging in exercise but has not sought physical therapy due to a previous unsatisfactory experience following a shoulder fracture.    She is considering weight loss medications as she is experiencing stress related to her weight gain, despite efforts to maintain a healthy diet. She has previously consulted with a nutritionist and participated in an exercise program, but these interventions did not yield the desired results. Currently, she is enrolled in a 16-week challenge program. She expresses concern about her increasing weight as she ages. She does not consume rice or fried foods and uses minimal oil in her cooking. She reports no fatigue, hair changes, or skin changes such as dryness. She has been practicing kickboxing for an extended period.    Social History:  The patient enjoys kickboxing as a hobby. Her diet consists of healthy foods with minimal oil, and she avoids rice and fried foods.    FAMILY HISTORY  She reports no family history of thyroid cancer.               Problem List[1]  Objective  Visit Vitals  BP 130/80   Pulse 73   Wt 102.7 kg   SpO2 96%   BMI 38.86 kg/m   OB Status Postmenopausal   BSA 2.15 m     Physical Exam  Constitutional:       General: She is not in acute distress.     Appearance: Normal appearance.   HENT:      Head: Normocephalic and atraumatic.   Eyes:      General: No scleral icterus.  Cardiovascular:      Rate and Rhythm: Normal rate and regular rhythm.      Pulses: Normal pulses.      Heart  sounds: Normal heart sounds. No murmur heard.     No friction rub. No gallop.   Pulmonary:      Effort: Pulmonary effort is normal.      Breath sounds: Normal breath sounds.   Musculoskeletal:      Right lower leg: No edema.      Left lower leg: No edema.   Lymphadenopathy:      Cervical: No cervical adenopathy.   Skin:     General: Skin is warm and dry.   Neurological:      Mental Status: She is alert.   Psychiatric:         Mood and Affect: Mood normal.         Behavior: Behavior normal.         Thought Content: Thought content normal.         Judgment: Judgment normal.       Physical Exam  Neck: No thyroid abnormalities palpated.  Respiratory: Clear to auscultation, no wheezing, rales or rhonchi.  Other: BMI is 38.86.               Results  Labs   - A1c: High   - Cholesterol: Borderline   -  Thyroid levels: 03/2024, Normal    Imaging   - MRI of spine: 06/2024, Normal               Assessment/Plan    Assessment & Plan  Morbid obesity    Orders:    Ambulatory referral to Pharmacist - Medication Optimization    tirzepatide, weight loss, (Zepbound) 2.5 mg/0.5 mL syringe; Inject 0.5 mL (2.5 mg) under the skin every 7 (seven) days.    Moderate mixed hyperlipidemia not requiring statin therapy    Orders:    POCT glycosylated hemoglobin (Hb A1C)    Hyperglycemia         Acute right-sided low back pain without sciatica             Assessment & Plan  1. Morbid obesity:  - Her BMI is 38.86. Despite efforts in diet and exercise, she has not seen significant results.  - A referral to the weight loss center for counseling with Dr. Waddell has been made. An attempt will be made to get a GLP-1 agonist covered by her insurance, [facility], although coverage is uncertain.  - If the medication is not covered, she will proceed with the weight loss clinic program.    2. Hyperlipidemia:  - Her cholesterol levels are borderline elevated.  - She is not currently on any medication for this condition.    3. Hyperglycemia:  - Her  blood sugar levels are elevated, and her A1c has been high.  - An A1c test will be conducted today to monitor her blood sugar levels.    4. Back pain:  - She reports intermittent back pain that sometimes returns.  - She has not pursued physical therapy due to past negative experiences.  - If the pain persists, physical therapy will be considered.    Follow-up: The patient will follow up in January 2026 for a physical examination.                                                 This visit note was drafted with the help of artificial intelligence. I obtained consent to record the visit from all participants for this purpose.    Ofilia Rayon, MD       Dnaiel Voller, MD        [1]   Patient Active Problem List  Diagnosis    Endometriosis of uterus    Hypertriglyceridemia     Class 2 obesity due to excess calories without serious comorbidity with body mass index (BMI) of 38.0 to 38.9 in adult    Obstructive sleep apnea (adult) (pediatric)    Diverticula of colon    Migraine headache     Vitamin D  deficiency    Change in stool caliber    Onychomycosis

## 2024-09-24 MED ORDER — tirzepatide, weight loss, (Zepbound) 2.5 mg/0.5 mL syringe
2.5 | SUBCUTANEOUS | 3 refills | 28.00000 days | Status: AC
Start: 2024-09-24 — End: ?

## 2024-09-24 NOTE — Telephone Encounter (Signed)
 Pls try to get PA approved for zepbound  Thank you

## 2024-09-28 NOTE — Telephone Encounter (Addendum)
 Unable to start PA yet.   Per CMM, pt is not enrolled in the virtual care program and needs to do this first.  Pt is aware, and awaiting when this is completed.

## 2024-10-05 NOTE — Telephone Encounter (Signed)
 PA has been submitted through [facility].    KEY: [id number]    Awaiting decision.

## 2024-10-08 NOTE — Telephone Encounter (Addendum)
 PA has been approved.    Dates: 09/05/24 - 06/05/25    Pt has been informed.    No further actions needed.  Signing off.

## 2024-12-03 ENCOUNTER — Encounter: Payer: BLUE CROSS/BLUE SHIELD | Primary: Family Medicine

## 2024-12-03 DIAGNOSIS — E6609 Other obesity due to excess calories: Principal | ICD-10-CM

## 2024-12-03 NOTE — Progress Notes (Deleted)
 [facility]  [facility]  [address]  [address] and [address]  [place] [address]  Dept: [phone]  Dept Fax: [phone]    Date of Service: PRECHART NOTE***    Patient: Kelsey Pace  DOB: Jul 29, 1966  MRN: [id number]    Referring Provider: ***    Chief Complaint: Weight Management-Initial Consult    History of Present Illness:     Kelsey Pace is a 58 y.o. female who presents for initial Pharmacotherapy visit for weight management education.    Explained my role working with patient's physician and patient gave verbal consent for co-management of obesity and associated co-morbidities.    09/22/24: Wt mgt referral placed  09/22/24: Dr. Gabino for follow up. Patient reported recent enrollment in a 16 week challenge program, dose kickboxing and follows healthy diet; limits carbs. Seen a nutritionist in the past. Patient to start Zepbound   09/28/24: note that PA states, insurance required virtual program.  10/08/24: note that zepbound  was approved 09/05/24-06/05/25    TODAY: ***      Wt Readings from Last 5 Encounters:   09/22/24 102.7 kg   07/29/24 100.7 kg   07/27/24 100.9 kg   07/25/24 99.8 kg   06/09/24 100.8 kg     BMI Readings from Last 5 Encounters:   09/22/24 38.86 kg/m   07/29/24 38.11 kg/m   07/27/24 38.17 kg/m   07/25/24 37.76 kg/m   06/09/24 38.14 kg/m     Lab Results   Component Value Date    HGBA1C 5.7 09/22/2024    HGBA1C 5.8 (H) 04/24/2024    HGBA1C 5.9 (H) 05/09/2023     Weight History  What has triggered weight gain: ***  Structured weight loss programs in the past: ***  Number of weight-loss attempts in the past 5 years: ***  Which one(s) worked best: ***  Barriers to losing weight and maintaining weight loss in the past: ***  Stressors: ***  Motivational factors: ***  Weight loss Medication/Bariatric History: ***  Obesity-related Complications: ***  Medications currently taking that  are associated with weight gain: ***    Weight Management goals:  Short-term goals: ***  Long-term goals: ***    Starting weight of: *** lb with BMI: *** on ***  5% body weight reduction: *** pound weight loss, to result in weight of *** lb  10% body weight reduction: *** pound weight loss, to result in weight of *** lb  15% body weight reduction: *** pound weight loss, to result in weight of *** lb    Lifestyle:  Tobacco: ***  Caffeine: ***  Beverages: {BEVERAGES CONSUMED:33362}  Alcohol: ***    Nutritional Patterns:  Specific Type (e.g. vegetarian, Vegan): ***  Any dietary restrictions: ***  Any emotional eating: ***  Night Snacking/Eating: ***    Breakfast: ***  Lunch: ***  Dinner: ***  Snacks: ***  Meals per day: ***  Snacks per day: ***    Physical Activity:  Type: ***  Times per week: ***   Duration: ***  Resistance: ***    Sleep Habits:  Hours per night: ***  Any difficulties: ***  Snoring/gasping during the night: ***    Occupation: ***    Patient Active Problem List   Diagnosis    Endometriosis of uterus    Hypertriglyceridemia    Class 2 obesity due to excess calories without serious comorbidity with body mass index (BMI)  of 38.0 to 38.9 in adult    Obstructive sleep apnea (adult) (pediatric)    Diverticula of colon    Migraine headache    Vitamin D  deficiency    Change in stool caliber    Onychomycosis     Current Outpatient Medications   Medication Sig Dispense Refill    cholecalciferol, vitamin D3, (Vitamin D3) 25 MCG (1000 UT) tablet Take 25 mcg by mouth once daily.      pantoprazole  (ProtoNix ) 40 mg EC tablet Take 1 tablet (40 mg) by mouth before breakfast for 7 days. Do not crush, chew, or split. 7 tablet 0    tirzepatide , weight loss, (Zepbound ) 2.5 mg/0.5 mL syringe Inject 0.5 mL (2.5 mg) under the skin every 7 (seven) days. 2 mL 3     No current facility-administered medications for this visit.     Allergies   Allergen Reactions    No Known Allergies      Family History[1]    Social  History[2]    ROS : ***    Vitals:    There were no vitals taken for this visit.    BP Readings from Last 3 Encounters:   09/22/24 130/80   07/27/24 120/78   07/26/24 136/84     Laboratory Values:     Lab Results   Component Value Date    NA 142 07/25/2024    K 4.1 07/25/2024    CO2 29 07/25/2024    CL 106 07/25/2024    BUN 16 07/25/2024    CREATININE 1.02 07/25/2024    EGFR 64 07/25/2024    GLUCOSE 112 07/25/2024    CALCIUM 8.7 07/25/2024     Lab Results   Component Value Date    ALT 33 07/25/2024    AST 20 07/25/2024    ALKPHOS 124 07/25/2024    BILITOT 0.6 07/25/2024      Lab Results   Component Value Date    CHOL 205 (H) 04/24/2024     Lab Results   Component Value Date    HDL 40 04/24/2024     Lab Results   Component Value Date    LDLCALC 128 (H) 04/24/2024     Component      Latest Ref Rng 04/24/2024   Non-HDL Calculation      0 - 129 mg/dL 834 (H)      Lab Results   Component Value Date    TRIG 210 (H) 04/24/2024     Lab Results   Component Value Date    HGBA1C 5.7 09/22/2024     Lab Results   Component Value Date    TSH 3.200 04/24/2024     FIB-4 Calculation: 1.04 at 07/25/2024 11:56 PM  Calculated from:  SGOT/AST: 20 U/L at 07/25/2024 11:55 PM  SGPT/ALT: 33 U/L at 07/25/2024 11:55 PM  Platelets: 195 K/uL at 07/25/2024 11:56 PM  Age: 6 years    The 10-year ASCVD risk score (Arnett DK, et al., 2019) is: 3.6%    Values used to calculate the score:      Age: 6 years      Clinically relevant sex: Female      Is Non-Hispanic African American: No      Diabetic: No      Tobacco smoker: No      Systolic Blood Pressure: 130 mmHg      Is BP treated: No      HDL Cholesterol: 40 mg/dL      Total Cholesterol: 205  mg/dL    PREVENT SCORE:   89-bzjm Total CVD Risk: 4.93%  30-year Total CVD risk: 25.93%    Assessment & Plan:   ~Obesity: Class ***    Weight Management Education:  Consultation regarding the factors of obesity and long term complications.  Reviewed with patient, guidelines for carbohydrates, in general to limit to  2-3 carbohydrate choices (30-45 grams) at each meal and 1-2 carbohydrate choices (15-30 grams) for snacks. Try to limit to no more than 2 snacks per day.   Gave patient 'Planning healthy meals' booklet, which includes common carbohydrates and corresponding portion sizes of grains, bread, cereals, milk and yogurt, fruits & starchy vegetables.   Reviewed the Plate Method and how to read a nutrition label (focus on serving size and total carbs).  Also reviewed fat (keep at 3gm/100 calories), sodium at or less than 200mg  per serving (should be less than calories per serving). Good amounts protein (0.8g/kg/day); for this patient: *** g/day or *** g/ meal, and fiber ~5g/svg.  Reviewed patient's current diet-identified carbs and how to reduce and/or suggest alternatives.  Eat regularly, small portions and do not skip meals  Fish 2-3 times per week  Lean cuts of beef and pork  Remove skin from poultry  Avoid fried foods  Choose non-fat or low-fat dairy products  Stay hydrated with water  Best to use measuring utensils, but reviewed estimating portion sizes with fist, palm, thumb  Physical activity: aerobic exercise 5-6 days per week (goal for weight loss is 60 minutes) and weights 2-3 times per week (spaced out).   *** Reviewed hazards of smoking, especially increased risk for heart and lung disease.    Plan:  ***    Time Spent: {TIME DEZWU:66645} minutes    Dr. PIERRETTE was present in the office suite and immediately available to provide assistance and direction.    Anette Birmingham, PharmD, BCPS, CDCES  Clinical Pharmacy Specialist          [1]   Family History  Problem Relation Name Age of Onset    Hypertension Mother      Arrhythmia Mother          pacemaker    Other (terrorist victim) Father  87        [place]    Asthma Sister      Asthma Brother      Hypertension Brother      Asthma Maternal Grandmother      Breast cancer Neg Hx     [2]   Social History  Socioeconomic History    Marital status: Married     Spouse name: Not  on file    Number of children: 3    Years of education: Not on file    Highest education level: Not on file   Occupational History    Occupation: Personal care aide   Tobacco Use    Smoking status: Never    Smokeless tobacco: Never   Vaping Use    Vaping status: Never Used   Substance and Sexual Activity    Alcohol use: Never    Drug use: Never    Sexual activity: Yes     Partners: Male     Birth control/protection: Post-menopausal   Other Topics Concern    Not on file   Social History Narrative    born in [place], came to [place]  in [place]    Lives with husband    Has 3 children - one lives in [place], one  in [place], one in [place]    Feels safe at home    non-smoker     Social Determinants of Health     Financial Resource Strain: Low Risk (07/25/2024)    Overall Financial Resource Strain (CARDIA)     Difficulty of Paying Living Expenses: Not hard at all   Recent Concern: Financial Resource Strain - High Risk (06/02/2024)    Overall Financial Resource Strain (CARDIA)     Difficulty of Paying Living Expenses: Very hard   Food Insecurity: Unknown (07/25/2024)    Hunger Vital Sign     Worried About Running Out of Food in the Last Year: Never true     Ran Out of Food in the Last Year: Not on file   Recent Concern: Food Insecurity - Food Insecurity Present (06/09/2024)    Hunger Vital Sign     Worried About Running Out of Food in the Last Year: Often true     Ran Out of Food in the Last Year: Not on file   Transportation Needs: No Transportation Needs (07/25/2024)    PRAPARE - Therapist, Art (Medical): No     Lack of Transportation (Non-Medical): No   Physical Activity: Not on file   Stress: Not on file   Social Connections: Not on file   Intimate Partner Violence: Not on file   Housing Stability: Unknown (07/25/2024)    Housing Stability Vital Sign     Unable to Pay for Housing in the Last Year: Not on file     Number of Times Moved in the Last Year: Not on file     Homeless in the Last Year: No   Recent  Concern: Housing Stability - High Risk (06/09/2024)    Housing Stability Vital Sign     Unable to Pay for Housing in the Last Year: Not on file     Number of Times Moved in the Last Year: Not on file     Homeless in the Last Year: Yes

## 2024-12-03 NOTE — Telephone Encounter (Signed)
 Pt booked with Anette Birmingham, PharmD on 12/06/2024 at 3:00 PM.    Pt is all set. No further action required.    Closing note.

## 2024-12-03 NOTE — Telephone Encounter (Signed)
 DEPT: [facility]     Patients car wont start. Will not make it to appt at 4pm today.    Wondering if it can be a telephone call or if not, can this be rescheduled?    Please advise.  CB#: [phone]

## 2024-12-03 NOTE — Patient Instructions (Incomplete)
 Follow dietary suggestions, as per booklet, start with highlighted goals below:  Low carbs: limit carbs to 2 per meal (30g) and 1 per snack (15g) with carb snacks limited to 2 per day.  Low fat: for every 100 cal, at or less than 3g of total fat  Lots of non-starchy vegetables: recommendation is 2-3 servings per day  Protein at every meal-lean and low fat: you want *** g per meal for total of *** g per day  Include fiber, e.g. whole grain foods: at or greater than 5g of fiber per serving  Do not skip meals  Drink 8 glasses of water daily  Do not eat carbs after 7pm  Physical activity: Start slow and work towards goal of: aerobic exercise 40-60 minutes 5-6 days per week and weights/resistance training 10-15 min., 2-3 times per week (spaced out during the week).   Behavioral Interventions:  Goal Setting: Nutrition and Physical activity, as above  Self-monitoring: Journaling or use of Apps (e.g. Lose It, My Fitness Pal)  Problem-Solving (e.g. eating in response to feelings or emotion): ***  Stress Management: e.g breathing exercises, meditation, muscle relaxation, yoga: ***  Mindful eating-eating without distractions, avoid eating while watching TV or an electronic device (computer, smartphone, iPad's). You want to focus, eat slowly and enjoy the food you are eating: ***  Utilize Social Support: Weight management group, family, friends: ***  Labs: ***  Follow UP: Dr. Waddell in 4-6 weeks for weight management follow up-1st, please schedule as medication optimization extend; 60 minutes. (If nothing is available, Okay to schedule: ADMIN slot: only-on a Monday at 11am or a Friday at 2, 3, or 4pm)

## 2024-12-06 ENCOUNTER — Encounter: Payer: BLUE CROSS/BLUE SHIELD | Primary: Family Medicine

## 2024-12-10 ENCOUNTER — Encounter: Admit: 2024-12-10 | Discharge: 2024-12-10 | Payer: BLUE CROSS/BLUE SHIELD | Primary: Family Medicine

## 2024-12-10 DIAGNOSIS — E66812 Obesity, class 2: Principal | ICD-10-CM

## 2024-12-10 NOTE — Patient Instructions (Addendum)
 Follow dietary suggestions, as per booklet, start with highlighted goals below:  Low carbs: limit carbs to 2 per meal (30g) and 1 per snack (15g) with carb snacks limited to 2 per day.  Lots of non-starchy vegetables: recommendation is 2-3 servings per day  Protein at every meal-lean and low fat: you want 31 g per meal for total of 93 g per day  Do not skip meals  Physical activity: Start slow and work towards goal of: aerobic exercise 40-60 minutes 5 days per week and weights/resistance training 10-15 min., 2-3 times per week (spaced out during the week).   Behavioral Interventions:  Goal Setting: Nutrition and Physical activity, as above  Self-monitoring: Journaling or use of Apps (e.g. Lose It, My Fitness Pal)  Mindful eating-eating without distractions, avoid eating while watching TV or an electronic device (computer, smartphone, iPad's). You want to focus, eat slowly and enjoy the food you are eating: continue eating at the dinner table with family.  Utilize Social Support: [employer] management group, family, friends: Husband   Labs: schedule for additional cholesterol labs: Lp (a) and Apo B schedule at front desk.  Follow UP: Dr. Waddell in 4-6 weeks for weight management follow up-1st, please schedule as medication optimization extend; 60 minutes. (If nothing is available, Okay to schedule: ADMIN slot: only-on a Monday at 11am or a Friday at 2, 3, or 4pm)

## 2024-12-10 NOTE — Progress Notes (Signed)
 [facility]  [facility]  [address]  [address] and [address]  [place] [address]  Dept: [phone]  Dept Fax: [phone]    Date of Service: 12/10/2024    Patient: Kelsey Pace  DOB: 11/25/1966  MRN: [id number]    Referring Provider: Dr. Gabino    Chief Complaint: Weight Management-Initial Consult    History of Present Illness:     Kelsey Pace is a 58 y.o. female who presents for initial Pharmacotherapy visit for weight management education.    Explained my role working with patient's physician and patient gave verbal consent for co-management of obesity and associated co-morbidities.    09/22/2024: Wt mgt referral placed  09/22/2024: Dr. Gabino for Weight Management Follow-up Visit. Weight 226.4 lb BMI 38.36. Zepbound  2.5 mg weekly was started. Prediabetic A1C of 5.7 %.    TODAY:   Patient went to pick up Zepbound  multiple times and the pharmacy stated she would have to contact the insurance.  Insurance asked her download an app and sent her a scale, however she is not sure how to navigate.    Wt Readings from Last 5 Encounters:   12/10/24 93.9 kg        207 lb   09/22/24 102.7 kg   07/29/24 100.7 kg   07/27/24 100.9 kg   07/25/24 99.8 kg     BMI Readings from Last 5 Encounters:   12/10/24 35.53 kg/m   09/22/24 38.86 kg/m   07/29/24 38.11 kg/m   07/27/24 38.17 kg/m   07/25/24 37.76 kg/m     Lab Results   Component Value Date    HGBA1C 5.7 09/22/2024    HGBA1C 5.8 (H) 04/24/2024    HGBA1C 5.9 (H) 05/09/2023     Weight History  What has triggered weight gain: Gained more weight after last pregnancy -33 years ago, since then has gained more weight.  Structured weight loss programs in the past: kick-boxing, consulted nutritionist, 16 week challenge (went twice)  Number of weight-loss attempts in the past 5 years: 2-3 times  Which one(s) worked best: architect (5 years ago)  Barriers to losing  weight and maintaining weight loss in the past: unsure because she eats healthy  Stressors: nothing other than weight loss  Motivational factors: to feel more healthy  Weight loss Medication/Bariatric History: No  Obesity-related Complications: Pre-diabetes, hyperlipidemia  Medications currently taking that are associated with weight gain: N/A    Weight Management goals:  Short-term goals: health  Long-term goals: health    Starting weight of: 207 lb with BMI: 35.53 on 12/12/205  5% body weight reduction: 10 pound weight loss, to result in weight of 197 lb  10% body weight reduction: 20 pound weight loss, to result in weight of 187 lb  15% body weight reduction: 30 pound weight loss, to result in weight of 177 lb    Lifestyle:  Tobacco: No  Caffeine: No  Beverages: water with sliced lemons and chia seeds, tea without sugar  Alcohol: No    Nutritional Patterns:  Specific Type (e.g. vegetarian, Vegan): No  Any dietary restrictions: No  Any emotional eating: No  Night Snacking/Eating: No    Breakfast: oatmeal with bananas  Lunch: sometimes skips, garden salad  Dinner: grilled veggies and chicken  Snacks: protein bar  Meals per day: 2  Snacks per day: 1    Physical Activity:  Type: running  Times  per week: 2   Duration: 20 minutes  Resistance: No    Sleep Habits:  Hours per night: 7-8 hours  Any difficulties: No  Snoring/gasping during the night: No, her husband told her she seems to sleep better recently.    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      tirzepatide , weight loss, (Zepbound ) 2.5 mg/0.5 mL syringe Inject 0.5 mL (2.5 mg) under the skin every 7 (seven) days. (Patient not taking: Reported on 12/10/2024) 2 mL 3     No current facility-administered medications for this visit.     Allergies   Allergen Reactions    No Known Allergies      Family History[1]    Social History[2]    ROS : See HPI    Vitals:    Wt 93.9 kg   BMI 35.53 kg/m     BP Readings from Last 3 Encounters:   09/22/24 130/80   07/27/24 120/78   07/26/24 136/84     Laboratory Values:     Lab Results   Component Value Date    NA 142 07/25/2024    K 4.1 07/25/2024    CO2 29 07/25/2024    CL 106 07/25/2024    BUN 16 07/25/2024    CREATININE 1.02 07/25/2024    EGFR 64 07/25/2024    GLUCOSE 112 07/25/2024    CALCIUM 8.7 07/25/2024     Lab Results   Component Value Date    ALT 33 07/25/2024    AST 20 07/25/2024    ALKPHOS 124 07/25/2024    BILITOT 0.6 07/25/2024      Lab Results   Component Value Date    CHOL 205 (H) 04/24/2024     Lab Results   Component Value Date    HDL 40 04/24/2024     Lab Results   Component Value Date    LDLCALC 128 (H) 04/24/2024     Lab Results   Component Value Date    TRIG 210 (H) 04/24/2024     Lab Results   Component Value Date    HGBA1C 5.7 09/22/2024     Lab Results   Component Value Date    TSH 3.200 04/24/2024     FIB-4 Calculation: 1.04 at 07/25/2024 11:56 PM  Calculated from:  SGOT/AST: 20 U/L at 07/25/2024 11:55 PM  SGPT/ALT: 33 U/L at 07/25/2024 11:55 PM  Platelets: 195 K/uL at 07/25/2024 11:56 PM  Age: 58 years    The 10-year ASCVD risk score (Arnett DK, et al., 2019) is: 3.6%    Values used to calculate the score:      Age: 58 years      Clinically relevant sex: Female      Is Non-Hispanic African American: No      Diabetic: No  Tobacco smoker: No      Systolic Blood Pressure: 130 mmHg      Is BP treated: No      HDL Cholesterol: 40 mg/dL      Total Cholesterol: 205  mg/dL    PREVENT 89-bzjm total CVD risk is 5.82 % , 30-year total CVD risk 29.03 %    Assessment & Plan:   ~Obesity: Class II, weight 207 lb BMI 35.53. Patient has lost 19.4 lbs since last visit with Dr. Gabino on 09/22/2024. Patient stated she has been limiting carbs such as bread and rice, however was stressed to see no loss of weight. Plan for today was to count fruits as carbs and increase protein intake. Also informed patient to reach out to insurance company for their specific virtual program requirements.    ~Hyperlipidemia: Recent labs 04/24/2024 Total 205, triglycerides 210, LDL 128, non-HDL 165. Patient having pre-diabetes and obesity, which are risk enhancing factors for atherosclerotic risk, along with elevated TG and discordant LDL and non-HDL levels, ordered labs for and Lp (a) and Apo (B).    ~Low Vitamin D3: Patient is currently taking vitamin D3 1,000 units daily, her recent Vitamin D  level was 17, therefore advised her to increased vitamin D3 intake to 2,000 units daily.    Weight Management Education:  Consultation regarding the factors of obesity and long term complications.  Reviewed with patient, guidelines for carbohydrates, in general to limit to 2-3 carbohydrate choices (30-45 grams) at each meal and 1-2 carbohydrate choices (15-30 grams) for snacks. Try to limit to no more than 2 snacks per day.   Gave patient 'Planning healthy meals' booklet, which includes common carbohydrates and corresponding portion sizes of grains, bread, cereals, milk and yogurt, fruits & starchy vegetables.   Reviewed the Plate Method and how to read a nutrition label (focus on serving size and total carbs).  Also reviewed fat (keep at 3gm/100 calories), sodium at or less than 200mg  per serving (should be less than calories per serving). Good amounts protein (0.8g/kg/day); for this patient: 93 g/day or 31 g/ meal, and fiber ~5g/svg.  Reviewed patient's current diet-identified carbs and how to reduce and/or suggest  alternatives.  Eat regularly, small portions and do not skip meals  Fish 2-3 times per week  Lean cuts of beef and pork  Remove skin from poultry  Avoid fried foods  Choose non-fat or low-fat dairy products  Stay hydrated with water  Best to use measuring utensils, but reviewed estimating portion sizes with fist, palm, thumb  Physical activity: aerobic exercise 5-6 days per week (goal for weight loss is 60 minutes) and weights 2-3 times per week (spaced out).     Plan:  Follow dietary suggestions, as per booklet, start with goals below:  Low carbs: limit carbs to 2 per meal (30g) and 1 per snack (15g) with carb snacks limited to 2 per day.  Lots of non-starchy vegetables: recommendation is 2-3 servings per day  Protein at every meal-lean and low fat: you want 31 g per meal for total of 93 g per day  Do not skip meals  Physical activity: Start slow and work towards goal of: aerobic exercise 40-60 minutes 5 days per week and weights/resistance training 10-15 min., 2-3 times per week (spaced out during the week).   Behavioral Interventions:  Goal Setting: Nutrition and Physical activity, as above  Self-monitoring: Journaling or use of Apps (e.g. Lose It, My Fitness Pal)  Mindful eating-eating without distractions, avoid eating while watching  TV or an electronic device (computer, smartphone, iPad's). You want to focus, eat slowly and enjoy the food you are eating: continue eating at the dinner table with family.  Utilize Social Support: [employer] management group, family, friends: Husband  Labs: schedule for additional cholesterol labs: Lp (a) and Apo B schedule at front desk.  Follow UP: Dr. Waddell in 4-6 weeks for weight management follow up-1st,     Time Spent: 75 minutes    Note to be co-signed by covering provider, as Dr. Gabino is out of the clinic today.    Dr. Sharlet was present in the office suite and immediately available to provide assistance and direction.  Note to be co-signed by covering provider, as Dr.  Gabino is out of the clinic today.    Supervision of PGY1 Pharmacy Resident:  This note was initiated by a PGY1 Pharmacy Resident, Francine Gartner PharmD, from [facility]. I was present for this encounter and verified the historical and physical findings. I discussed the care of the patient with the resident, including history, vitals, physical exam, and therapeutic decision-making. I reviewed, amended, and approved this documentation and it serves as an accurate record of the services performed and decisions I made.    Anette Waddell, PharmD, BCPS, CDCES  Clinical Pharmacy Specialist           [1]   Family History  Problem Relation Name Age of Onset    Hypertension Mother      Arrhythmia Mother          pacemaker    Other (terrorist victim) Father  42        [place]    Asthma Sister      Asthma Brother      Hypertension Brother      Asthma Maternal Grandmother      Breast cancer Neg Hx     [2]   Social History  Socioeconomic History    Marital status: Married     Spouse name: None    Number of children: 3    Years of education: None    Highest education level: None   Occupational History    Occupation: Nurse, adult   Tobacco Use    Smoking status: Never    Smokeless tobacco: Never   Vaping Use    Vaping status: Never Used   Substance and Sexual Activity    Alcohol use: Never    Drug use: Never    Sexual activity: Yes     Partners: Male     Birth control/protection: Post-menopausal   Other Topics Concern    None   Social History Narrative    born in [place], came to [place]  in [place]    Lives with husband    Has 3 children - one lives in [place], one in [place], one in [place]    Feels safe at home    non-smoker     Social Determinants of Health     Financial Resource Strain: Low Risk (07/25/2024)    Overall Financial Resource Strain (CARDIA)     Difficulty of Paying Living Expenses: Not hard at all   Recent Concern: Financial Resource Strain - High Risk (06/02/2024)    Overall Financial Resource Strain  (CARDIA)     Difficulty of Paying Living Expenses: Very hard   Food Insecurity: Unknown (07/25/2024)    Hunger Vital Sign     Worried About Running Out of Food in the Last Year: Never true  Ran Out of Food in the Last Year: Not on file   Recent Concern: Food Insecurity - Food Insecurity Present (06/09/2024)    Hunger Vital Sign     Worried About Running Out of Food in the Last Year: Often true     Ran Out of Food in the Last Year: Not on file   Transportation Needs: No Transportation Needs (07/25/2024)    PRAPARE - Therapist, Art (Medical): No     Lack of Transportation (Non-Medical): No   Physical Activity: Not on file   Stress: Not on file   Social Connections: Not on file   Intimate Partner Violence: Not on file   Housing Stability: Unknown (07/25/2024)    Housing Stability Vital Sign     Unable to Pay for Housing in the Last Year: Not on file     Number of Times Moved in the Last Year: Not on file     Homeless in the Last Year: No   Recent Concern: Housing Stability - High Risk (06/09/2024)    Housing Stability Vital Sign     Unable to Pay for Housing in the Last Year: Not on file     Number of Times Moved in the Last Year: Not on file     Homeless in the Last Year: Yes

## 2024-12-15 ENCOUNTER — Other Ambulatory Visit: Admit: 2024-12-15 | Payer: BLUE CROSS/BLUE SHIELD | Primary: Family Medicine

## 2024-12-15 DIAGNOSIS — E785 Hyperlipidemia, unspecified: Principal | ICD-10-CM

## 2024-12-15 NOTE — Progress Notes (Signed)
 Lab drawn.

## 2024-12-16 LAB — LIPOPROTEIN A (LPA): Lipoprotein (a): 76.8 nmol/L — ABNORMAL HIGH (ref <75.0)

## 2024-12-16 LAB — APOLIPOPROTEIN B: Apolipoprotein B: 93 mg/dL — ABNORMAL HIGH (ref <90)

## 2025-01-14 ENCOUNTER — Encounter: Payer: BLUE CROSS/BLUE SHIELD | Primary: Family Medicine

## 2025-01-25 ENCOUNTER — Encounter: Payer: BLUE CROSS/BLUE SHIELD | Primary: Family Medicine

## 2025-01-25 NOTE — Progress Notes (Unsigned)
 [facility]  [facility]  [address]  [address]  [place] [address]  Dept: [phone]  Dept Fax: [phone]    Date of Service: PRECHART NOTE ***    Patient: Kelsey Pace  DOB: Jan 01, 1966  MRN: [id number]    Referring Provider: Dr. Gabino    Chief Complaint: Weight Management-Follow Up Visit-1st    History of Present Illness:     Kelsey Pace is a 59 y.o. female who presents for follow up Pharmacotherapy visit for weight management.     12/10/24, our last visit, wt was 207 lb. Weight management education was provided and lifestyle goals were set. Patient had lost 19.4 lb over the previous 3 months, after her visit with Dr. Gabino. Patient unable to obtain Zepbound  as her insurance required a specific program for coverage. Patient also reminded to increase her vitamin D  to 2000 units per day.    TODAY:   ***      Wt Readings from Last 5 Encounters:   12/10/24 93.9 kg   09/22/24 102.7 kg   07/29/24 100.7 kg   07/27/24 100.9 kg   07/25/24 99.8 kg     BMI Readings from Last 5 Encounters:   12/10/24 35.53 kg/m   09/22/24 38.86 kg/m   07/29/24 38.11 kg/m   07/27/24 38.17 kg/m   07/25/24 37.76 kg/m     Weight Management goals:  Short-term goals: health  Long-term goals: health     Starting weight of: 207 lb with BMI: 35.53 on 12/12/205  5% body weight reduction: 10 pound weight loss, to result in weight of 197 lb  10% body weight reduction: 20 pound weight loss, to result in weight of 187 lb  15% body weight reduction: 30 pound weight loss, to result in weight of 177 lb    Lifestyle Follow UP:  Goals from last visit:  Follow dietary suggestions, as per booklet, start with goals below:  Low carbs: limit carbs to 2 per meal (30g) and 1 per snack (15g) with carb snacks limited to 2 per day.  Lots of non-starchy vegetables: recommendation is 2-3 servings per day  Protein at every meal-lean  and low fat: you want 31 g per meal for total of 93 g per day  Do not skip meals  Physical activity: Start slow and work towards goal of: aerobic exercise 40-60 minutes 5 days per week and weights/resistance training 10-15 min., 2-3 times per week (spaced out during the week).   Behavioral Interventions:  Goal Setting: Nutrition and Physical activity, as above  Self-monitoring: Journaling or use of Apps (e.g. Lose It, My Fitness Pal)  Mindful eating-eating without distractions, avoid eating while watching TV or an electronic device (computer, smartphone, iPad's). You want to focus, eat slowly and enjoy the food you are eating: continue eating at the dinner table with family.  Utilize Social Support: [employer] management group, family, friends: Husband    Patient Active Problem List   Diagnosis    Endometriosis of uterus    Hypertriglyceridemia    Class 2 obesity due to excess calories without serious comorbidity with body mass index (BMI) of 38.0 to 38.9 in adult    Obstructive sleep apnea (adult) (pediatric)    Diverticula of colon    Migraine headache    Vitamin D  deficiency    Change in stool caliber    Onychomycosis  Current Outpatient Medications   Medication Sig Dispense Refill    cholecalciferol, vitamin D3, (Vitamin D3) 25 MCG (1000 UT) tablet Take 25 mcg by mouth once daily.      tirzepatide , weight loss, (Zepbound ) 2.5 mg/0.5 mL syringe Inject 0.5 mL (2.5 mg) under the skin every 7 (seven) days. (Patient not taking: Reported on 12/10/2024) 2 mL 3     No current facility-administered medications for this visit.     Allergies   Allergen Reactions    No Known Allergies      Family History[1]    Social History[2]    ROS : ***    Vitals:    There were no vitals taken for this visit.    BP Readings from Last 3 Encounters:   09/22/24 130/80   07/27/24 120/78   07/26/24 136/84     Laboratory Values:     Lab Results   Component Value Date    NA 142 07/25/2024    K 4.1 07/25/2024    CO2 29 07/25/2024    CL 106  07/25/2024    BUN 16 07/25/2024    CREATININE 1.02 07/25/2024    EGFR 64 07/25/2024    GLUCOSE 112 07/25/2024    CALCIUM 8.7 07/25/2024     Lab Results   Component Value Date    ALT 33 07/25/2024    AST 20 07/25/2024    ALKPHOS 124 07/25/2024    BILITOT 0.6 07/25/2024      Lab Results   Component Value Date    CHOL 205 (H) 04/24/2024     Lab Results   Component Value Date    HDL 40 04/24/2024     Lab Results   Component Value Date    LDLCALC 128 (H) 04/24/2024     Lab Results   Component Value Date    TRIG 210 (H) 04/24/2024     Lab Results   Component Value Date    HGBA1C 5.7 09/22/2024      Lab Results   Component Value Date    TSH 3.200 04/24/2024     Component      Latest Ref Rng 12/15/2024   Lipoprotein (a)      <75.0 nmol/L 76.8 (H)      Component      Latest Ref Rng 12/15/2024   Apolipoprotein B      <90 mg/dL 93 (H)      Assessment & Plan:     ~Obesity: Class ***. Patient has lost ~*** pounds since our last visit, *** months ago. Patient has lost a total of ~ *** pounds; TBWL of ***% and down *** BMI points over *** months.    Plan:  ***    Time Spent: {TIME [id number]:66645} minutes    Dr. PIERRETTE was present in the office suite and immediately available to provide assistance and direction.    Anette Birmingham, PharmD, BCPS, CDCES  Clinical Pharmacy Specialist         [1]   Family History  Problem Relation Name Age of Onset    Hypertension Mother      Arrhythmia Mother          pacemaker    Other (terrorist victim) Father  26        [place]    Asthma Sister      Asthma Brother      Hypertension Brother      Asthma Maternal Grandmother      Breast cancer Neg Hx     [  2]   Social History  Socioeconomic History    Marital status: Married     Spouse name: Not on file    Number of children: 3    Years of education: Not on file    Highest education level: Not on file   Occupational History    Occupation: Personal care aide   Tobacco Use    Smoking status: Never    Smokeless tobacco: Never   Vaping Use    Vaping status:  Never Used   Substance and Sexual Activity    Alcohol use: Never    Drug use: Never    Sexual activity: Yes     Partners: Male     Birth control/protection: Post-menopausal   Other Topics Concern    Not on file   Social History Narrative    born in [place], came [place]  in [place]    Lives with husband    Has 3 children - one lives in [place], one in [place], one in [place]    Feels safe at home    non-smoker     Social Determinants of Health     Financial Resource Strain: Low Risk (07/25/2024)    Overall Financial Resource Strain (CARDIA)     Difficulty of Paying Living Expenses: Not hard at all   Recent Concern: Financial Resource Strain - High Risk (06/02/2024)    Overall Financial Resource Strain (CARDIA)     Difficulty of Paying Living Expenses: Very hard   Food Insecurity: Unknown (07/25/2024)    Hunger Vital Sign     Worried About Running Out of Food in the Last Year: Never true     Ran Out of Food in the Last Year: Not on file   Recent Concern: Food Insecurity - Food Insecurity Present (06/09/2024)    Hunger Vital Sign     Worried About Running Out of Food in the Last Year: Often true     Ran Out of Food in the Last Year: Not on file   Transportation Needs: No Transportation Needs (07/25/2024)    PRAPARE - Therapist, Art (Medical): No     Lack of Transportation (Non-Medical): No   Physical Activity: Not on file   Stress: Not on file   Social Connections: Not on file   Intimate Partner Violence: Not on file   Housing Stability: Unknown (07/25/2024)    Housing Stability Vital Sign     Unable to Pay for Housing in the Last Year: Not on file     Number of Times Moved in the Last Year: Not on file     Homeless in the Last Year: No   Recent Concern: Housing Stability - High Risk (06/09/2024)    Housing Stability Vital Sign     Unable to Pay for Housing in the Last Year: Not on file     Number of Times Moved in the Last Year: Not on file     Homeless in the Last Year: Yes

## 2025-01-25 NOTE — Patient Instructions (Incomplete)
 Nutrition goal:   ***  Physical activity goal:   Aerobic: ***  Weights/Resistance training: ***  Behavioral Interventions:  Goal Setting: Nutrition and Physical activity, as above  Self-monitoring: Journaling or use of Apps (e.g. Lose It, My Fitness Pal): ***  Problem-Solving (e.g. eating in response to feelings or emotion): ***  Stress Management: e.g breathing exercises, meditation, muscle relaxation, yoga: ***  "Mindful eating"-eating without distractions, avoid eating while watching TV or an electronic device (computer, smartphone, iPad's). You want to focus, eat slowly and enjoy the food you are eating: ***  Social Support: Weight management group, family, friends: ***  Medication: ***  Labs: ***  Follow UP: Dr. Ladona Ridgel in *** for WT MGT F/U-***visit, please schedule as medication optimization *** for *** minutes.

## 2025-01-27 ENCOUNTER — Encounter: Payer: BLUE CROSS/BLUE SHIELD | Attending: Family Medicine | Primary: Family Medicine
